# Patient Record
Sex: Female | Born: 1960 | Race: White | Hispanic: Yes | Marital: Married | Smoking: Former smoker
Health system: Southern US, Community
[De-identification: ages and names within clinical notes are randomized; demographics above are authoritative.]

## PROBLEM LIST (undated history)

## (undated) ENCOUNTER — Emergency Department: Discharge status: Absent without leave | Payer: Self-pay

## (undated) DIAGNOSIS — M1990 Unspecified osteoarthritis, unspecified site: Secondary | ICD-10-CM

## (undated) DIAGNOSIS — M19111 Post-traumatic osteoarthritis, right shoulder: Secondary | ICD-10-CM

## (undated) DIAGNOSIS — S46001S Unspecified injury of muscle(s) and tendon(s) of the rotator cuff of right shoulder, sequela: Secondary | ICD-10-CM

## (undated) DIAGNOSIS — R0600 Dyspnea, unspecified: Secondary | ICD-10-CM

## (undated) DIAGNOSIS — E079 Disorder of thyroid, unspecified: Secondary | ICD-10-CM

## (undated) DIAGNOSIS — G7000 Myasthenia gravis without (acute) exacerbation: Secondary | ICD-10-CM

## (undated) DIAGNOSIS — F419 Anxiety disorder, unspecified: Secondary | ICD-10-CM

## (undated) DIAGNOSIS — F319 Bipolar disorder, unspecified: Secondary | ICD-10-CM

## (undated) DIAGNOSIS — E039 Hypothyroidism, unspecified: Secondary | ICD-10-CM

## (undated) DIAGNOSIS — F609 Personality disorder, unspecified: Secondary | ICD-10-CM

## (undated) DIAGNOSIS — N189 Chronic kidney disease, unspecified: Secondary | ICD-10-CM

## (undated) DIAGNOSIS — M21371 Foot drop, right foot: Secondary | ICD-10-CM

## (undated) DIAGNOSIS — J449 Chronic obstructive pulmonary disease, unspecified: Secondary | ICD-10-CM

## (undated) DIAGNOSIS — K219 Gastro-esophageal reflux disease without esophagitis: Secondary | ICD-10-CM

## (undated) DIAGNOSIS — G4700 Insomnia, unspecified: Secondary | ICD-10-CM

## (undated) HISTORY — DX: Bipolar disorder, unspecified: F31.9

## (undated) HISTORY — PX: EYE SURGERY: SHX253

## (undated) HISTORY — PX: INCONTINENCE SURGERY: SHX676

## (undated) HISTORY — DX: Myasthenia gravis without (acute) exacerbation: G70.00

## (undated) HISTORY — PX: INDUCED ABORTION: SHX677

## (undated) HISTORY — PX: LAPAROTOMY: SHX154

## (undated) HISTORY — DX: Disorder of thyroid, unspecified: E07.9

## (undated) HISTORY — DX: Unspecified injury of muscle(s) and tendon(s) of the rotator cuff of right shoulder, sequela: S46.001S

## (undated) HISTORY — PX: CONDYLOMA EXCISION/FULGURATION: SHX1389

## (undated) HISTORY — DX: Unspecified osteoarthritis, unspecified site: M19.90

## (undated) HISTORY — DX: Personality disorder, unspecified: F60.9

## (undated) HISTORY — PX: LEEP: SHX91

## (undated) HISTORY — PX: CHOLECYSTECTOMY: SHX55

## (undated) HISTORY — DX: Post-traumatic osteoarthritis, right shoulder: M19.111

## (undated) HISTORY — PX: THYROIDECTOMY: SHX17

## (undated) HISTORY — PX: OTHER SURGICAL HISTORY: SHX169

## (undated) HISTORY — DX: Gastro-esophageal reflux disease without esophagitis: K21.9

## (undated) HISTORY — PX: CARDIAC SURGERY: SHX584

## (undated) HISTORY — DX: Chronic obstructive pulmonary disease, unspecified: J44.9

## (undated) HISTORY — DX: Insomnia, unspecified: G47.00

---

## 2009-04-14 ENCOUNTER — Encounter: Payer: Self-pay | Admitting: Family Medicine

## 2009-07-23 ENCOUNTER — Ambulatory Visit: Payer: Self-pay | Admitting: Family Medicine

## 2009-07-23 DIAGNOSIS — J449 Chronic obstructive pulmonary disease, unspecified: Secondary | ICD-10-CM | POA: Insufficient documentation

## 2009-07-23 DIAGNOSIS — M069 Rheumatoid arthritis, unspecified: Secondary | ICD-10-CM | POA: Insufficient documentation

## 2009-08-22 ENCOUNTER — Ambulatory Visit: Payer: Self-pay | Admitting: Family Medicine

## 2009-08-22 DIAGNOSIS — F603 Borderline personality disorder: Secondary | ICD-10-CM | POA: Insufficient documentation

## 2009-08-22 DIAGNOSIS — F4310 Post-traumatic stress disorder, unspecified: Secondary | ICD-10-CM | POA: Insufficient documentation

## 2009-08-22 DIAGNOSIS — M25569 Pain in unspecified knee: Secondary | ICD-10-CM | POA: Insufficient documentation

## 2009-08-22 DIAGNOSIS — F319 Bipolar disorder, unspecified: Secondary | ICD-10-CM | POA: Insufficient documentation

## 2009-08-22 DIAGNOSIS — G43909 Migraine, unspecified, not intractable, without status migrainosus: Secondary | ICD-10-CM | POA: Insufficient documentation

## 2009-08-27 ENCOUNTER — Telehealth: Payer: Self-pay | Admitting: Family Medicine

## 2009-09-03 ENCOUNTER — Telehealth: Payer: Self-pay | Admitting: Family Medicine

## 2009-10-02 ENCOUNTER — Encounter: Admission: RE | Admit: 2009-10-02 | Discharge: 2009-10-02 | Payer: Self-pay | Admitting: Sports Medicine

## 2009-10-02 ENCOUNTER — Encounter: Payer: Self-pay | Admitting: Family Medicine

## 2009-12-16 ENCOUNTER — Ambulatory Visit: Payer: Self-pay | Admitting: Obstetrics & Gynecology

## 2009-12-16 LAB — CONVERTED CEMR LAB: Chlamydia, DNA Probe: NEGATIVE

## 2009-12-22 ENCOUNTER — Encounter: Admission: RE | Admit: 2009-12-22 | Discharge: 2009-12-22 | Payer: Self-pay | Admitting: Obstetrics & Gynecology

## 2009-12-31 ENCOUNTER — Ambulatory Visit: Payer: Self-pay | Admitting: Obstetrics & Gynecology

## 2009-12-31 ENCOUNTER — Other Ambulatory Visit: Admission: RE | Admit: 2009-12-31 | Discharge: 2009-12-31 | Payer: Self-pay | Admitting: Obstetrics & Gynecology

## 2010-02-03 ENCOUNTER — Ambulatory Visit: Payer: Self-pay | Admitting: Family

## 2010-02-03 LAB — CONVERTED CEMR LAB
ALT: 13 units/L (ref 0–35)
AST: 19 units/L (ref 0–37)
Albumin: 4.4 g/dL (ref 3.5–5.2)
Alkaline Phosphatase: 66 units/L (ref 39–117)
Amylase: 72 units/L (ref 0–105)
Bilirubin Urine: NEGATIVE
Bilirubin, Direct: 0.1 mg/dL (ref 0.0–0.3)
Indirect Bilirubin: 0.2 mg/dL (ref 0.0–0.9)
Lipase: 24 units/L (ref 0–75)
Total Bilirubin: 0.3 mg/dL (ref 0.3–1.2)
Urobilinogen, UA: 0.2

## 2010-02-04 ENCOUNTER — Telehealth: Payer: Self-pay | Admitting: Family

## 2010-02-04 ENCOUNTER — Encounter: Admission: RE | Admit: 2010-02-04 | Discharge: 2010-02-04 | Payer: Self-pay | Admitting: Family Medicine

## 2010-02-06 ENCOUNTER — Encounter: Payer: Self-pay | Admitting: Family

## 2010-02-09 ENCOUNTER — Telehealth: Payer: Self-pay | Admitting: Family

## 2010-02-19 ENCOUNTER — Encounter: Admission: RE | Admit: 2010-02-19 | Discharge: 2010-02-19 | Payer: Self-pay | Admitting: Family Medicine

## 2010-02-19 ENCOUNTER — Ambulatory Visit: Payer: Self-pay | Admitting: Family

## 2010-02-20 ENCOUNTER — Telehealth: Payer: Self-pay | Admitting: Family

## 2010-05-12 ENCOUNTER — Encounter: Admission: RE | Admit: 2010-05-12 | Discharge: 2010-05-12 | Payer: Self-pay | Admitting: Family Medicine

## 2010-05-12 ENCOUNTER — Ambulatory Visit: Payer: Self-pay | Admitting: Family Medicine

## 2010-05-13 ENCOUNTER — Encounter: Payer: Self-pay | Admitting: Family Medicine

## 2010-05-13 LAB — CONVERTED CEMR LAB
HCT: 41.4 % (ref 36.0–46.0)
Lymphs Abs: 1.9 10*3/uL (ref 0.7–4.0)
MCHC: 33.9 g/dL (ref 30.0–36.0)
Monocytes Relative: 7 % (ref 3–12)
Neutro Abs: 5 10*3/uL (ref 1.7–7.7)
Neutrophils Relative %: 68 % (ref 43–77)
RBC: 4.36 M/uL (ref 3.87–5.11)

## 2010-06-03 ENCOUNTER — Encounter: Payer: Self-pay | Admitting: Family Medicine

## 2010-06-03 LAB — CONVERTED CEMR LAB: HDL: 15 mg/dL

## 2010-06-05 ENCOUNTER — Ambulatory Visit: Payer: Self-pay | Admitting: Family Medicine

## 2010-06-05 DIAGNOSIS — E785 Hyperlipidemia, unspecified: Secondary | ICD-10-CM | POA: Insufficient documentation

## 2010-06-08 LAB — CONVERTED CEMR LAB: Direct LDL: 128 mg/dL — ABNORMAL HIGH

## 2010-07-27 ENCOUNTER — Telehealth: Payer: Self-pay | Admitting: Family Medicine

## 2010-07-28 ENCOUNTER — Encounter: Payer: Self-pay | Admitting: Family Medicine

## 2010-07-29 LAB — CONVERTED CEMR LAB: VLDL: 35 mg/dL (ref 0–40)

## 2010-10-11 ENCOUNTER — Encounter: Payer: Self-pay | Admitting: Family Medicine

## 2010-10-20 NOTE — Progress Notes (Signed)
Summary: Lab order  Phone Note Call from Patient Call back at Home Phone [phone]   Caller: Patient Call For: Nani Gasser MD Summary of Call: Needs order for her choleterol to be checked. Will go in the morning. Fax to [facility] Initial call taken by: Kathlene November LPN,  July 27, 2010 1:38 PM

## 2010-10-20 NOTE — Letter (Signed)
Summary: Health Screening  Health Screening   Imported By: Lanelle Bal 07/29/2010 09:48:07  _____________________________________________________________________  External Attachment:    Type:   Image     Comment:   External Document

## 2010-10-20 NOTE — Assessment & Plan Note (Signed)
Summary: Atypical right chest pain   Vital Signs:  Patient profile:   50 year old female Height:      65 inches Weight:      203 pounds Pulse rate:   74 / minute BP sitting:   116 / 74  (left arm) Cuff size:   large  Vitals Entered By: Avon Gully CMA, Duncan Dull) (May 12, 2010 4:31 PM) CC: chest paint on the rt side since this am.Pt states it hurts to cough and breath deeply   CC:  chest paint on the rt side since this am.Pt states it hurts to cough and breath deeply.  History of Present Illness: chest paint on the rt side since this am.Pt states it hurts to cough and breath deeply  Yesterday mid-morning and started getting a pain in her right chest with each breath. Was not doing anything when it started.  Now pain is traveling into her right breast.  Still painful when she breaths in. If breaths out hard then hurts  feels like a pulled muscles.  Hx of prolonged of long QT on previous EKG.  No recent URI or fever sxs. Slight cough this AM.  Used to hot coffee to settle it. Never had this before.   Current Medications (verified): 1)  Geodon 80 Mg Caps (Ziprasidone Hcl) .... 3 Tab By Mouth Once Daily 2)  Ranitidine Hcl 300 Mg Tabs (Ranitidine Hcl) .Marland Kitchen.. 1 Tab By Mouth Once Daily 3)  Zegerid 40-1100 Mg Caps (Omeprazole-Sodium Bicarbonate) .Marland Kitchen.. 1 Capsule By Mouth Bid 4)  Levothroid 137 Mcg Tabs (Levothyroxine Sodium) .Marland Kitchen.. 1 Tab By Mouth Once Daily 5)  Senna Laxative 8.6 Mg Tabs (Sennosides) .... 2 Tabs By Mouth Once Daily 6)  Dulera 200-5 Mcg/act Aero (Mometasone Furo-Formoterol Fum) .... 2 Puffs By Mouth Two Times A Day 7)  Proair Hfa 108 (90 Base) Mcg/act  Aers (Albuterol Sulfate) .... 2 Inh Q4h As Needed Shortness of Breath To Be Used As A Rescue Inhaler 8)  Oxybutynin Chloride 5 Mg Tabs (Oxybutynin Chloride) .... Take One Tablet By Mouth in The Mornings and One Tablet At Bedtime 9)  Clonidine Hcl 0.1 Mg Tabs (Clonidine Hcl) .... Take 2 Tablets By Mouth At Bedtime 10)  Topamax 100  Mg Tabs (Topiramate) .... Take 2 Tablets By Mouth On The Mornings and One Tablet By Mouth At Bedtime 11)  Seroquel 400 Mg Tabs (Quetiapine Fumarate) .... Take One Tablet By Mouth At Bedtime 12)  Pristiq 100 Mg Xr24h-Tab (Desvenlafaxine Succinate) .... Take One Tablet By Mouth Aqt Bedtime 13)  Vicodin Hp 10-660 Mg Tabs (Hydrocodone-Acetaminophen) .... Take One Tablet By Mouth As Needed For Pain 14)  Butalbital-Apap-Caffeine 50-325-40 Mg Caps (Butalbital-Apap-Caffeine) .... Take One Tablet By Mouth As Needed For Pain 15)  Clindamycin Phosphate 1 % Soln (Clindamycin Phosphate) .... As Needed 16)  Stool Softener 100 Mg Caps (Docusate Sodium) .... Takje 2 Tablets By Mouth At Bedtime 17)  One-A-Day Extras Antioxidant  Caps (Multiple Vitamins-Minerals) .... Take One Tablet By Mouth Once A Day 18)  Fish Oil 1000 Mg Caps (Omega-3 Fatty Acids) .... Take One Tablet By Mouth in The Mornings 19)  Super Calcium/d 600-125 Mg-Unit Tabs (Calcium-Vitamin D) .... Take 2 Tablets in The Morning and 1 Tablet At Bedtime 20)  Lotrisone 1-0.05 % Crea (Clotrimazole-Betamethasone) .... Apply Twice Daily To Affected Areas Until Healed. 21)  Macrodantin 100 Mg Caps (Nitrofurantoin Macrocrystal) .... One Tablet By Mouth Two Times A Day X 7 Days 22)  Lidoderm 5 % Ptch (Lidocaine) .Marland KitchenMarland KitchenMarland Kitchen  Apply To Affected Area 12 Hours A Day  Allergies (verified): 1)  ! Sulfa 2)  ! Penicillin 3)  ! * Bee Stings  Comments:  Nurse/Medical Assistant: The patient's medications and allergies were reviewed with the patient and were updated in the Medication and Allergy Lists. Avon Gully CMA, Duncan Dull) (May 12, 2010 4:32 PM)  Past History:  Past Medical History: Last updated: 08/22/2009 COPD Thyroid Sleep insomia Acid Reflux Rheumatoid arthritis Bipolar - Treated by Dr. Sandria Manly (psych in [facility]) [phone] Personality disorder  Past Surgical History: Last updated: 08/22/2009 Caesarean section Cholecystectomy Thyroidectomy  - hemi LEEP Procedure - 2 Bladder sling procedure - 2 Eye surgery Abortion of pregnancy  Physical Exam  General:  Well-developed,well-nourished,in no acute distress; alert,appropriate and cooperative throughout examination Head:  Normocephalic and atraumatic without obvious abnormalities. No apparent alopecia or balding. Neck:  No deformities, masses, or tenderness noted. Lungs:  Normal respiratory effort, chest expands symmetrically. Lungs are clear to auscultation, no crackles or wheezes. Heart:  Normal rate and regular rhythm. S1 and S2 normal without gallop, murmur, click, rub or other extra sounds. Skin:  no rashes.   Psych:  Cognition and judgment appear intact. Alert and cooperative with normal attention span and concentration. No apparent delusions, illusions, hallucinations   Impression & Recommendations:  Problem # 1:  CHEST PAIN, ATYPICAL (ICD-786.59) Worrisome for possible pneuomothorax or PNA.  Will get CXR and stat CBC to better evaluate.  EKG shows rate of 81bpm, NSR with prolonged QT.   Orders: T-Chest x-ray, 2 views (71020) T-CBC w/Diff ([id number]) EKG w/ Interpretation (93000)  Complete Medication List: 1)  Geodon 80 Mg Caps (Ziprasidone hcl) .... 3 tab by mouth once daily 2)  Ranitidine Hcl 300 Mg Tabs (Ranitidine hcl) .Marland Kitchen.. 1 tab by mouth once daily 3)  Zegerid 40-1100 Mg Caps (Omeprazole-sodium bicarbonate) .Marland Kitchen.. 1 capsule by mouth bid 4)  Levothroid 137 Mcg Tabs (Levothyroxine sodium) .Marland Kitchen.. 1 tab by mouth once daily 5)  Senna Laxative 8.6 Mg Tabs (Sennosides) .... 2 tabs by mouth once daily 6)  Dulera 200-5 Mcg/act Aero (Mometasone furo-formoterol fum) .... 2 puffs by mouth two times a day 7)  Proair Hfa 108 (90 Base) Mcg/act Aers (Albuterol sulfate) .... 2 inh q4h as needed shortness of breath to be used as a rescue inhaler 8)  Oxybutynin Chloride 5 Mg Tabs (Oxybutynin chloride) .... Take one tablet by mouth in the mornings and one tablet at bedtime 9)   Clonidine Hcl 0.1 Mg Tabs (Clonidine hcl) .... Take 2 tablets by mouth at bedtime 10)  Topamax 100 Mg Tabs (Topiramate) .... Take 2 tablets by mouth on the mornings and one tablet by mouth at bedtime 11)  Seroquel 400 Mg Tabs (Quetiapine fumarate) .... Take one tablet by mouth at bedtime 12)  Pristiq 100 Mg Xr24h-tab (Desvenlafaxine succinate) .... Take one tablet by mouth aqt bedtime 13)  Vicodin Hp 10-660 Mg Tabs (Hydrocodone-acetaminophen) .... Take one tablet by mouth as needed for pain 14)  Butalbital-apap-caffeine 50-325-40 Mg Caps (Butalbital-apap-caffeine) .... Take one tablet by mouth as needed for pain 15)  Clindamycin Phosphate 1 % Soln (Clindamycin phosphate) .... As needed 16)  Stool Softener 100 Mg Caps (Docusate sodium) .... Takje 2 tablets by mouth at bedtime 17)  One-a-day Extras Antioxidant Caps (Multiple vitamins-minerals) .... Take one tablet by mouth once a day 18)  Fish Oil 1000 Mg Caps (Omega-3 fatty acids) .... Take one tablet by mouth in the mornings 19)  Super Calcium/d 600-125 Mg-unit Tabs (  Calcium-vitamin d) .... Take 2 tablets in the morning and 1 tablet at bedtime 20)  Lotrisone 1-0.05 % Crea (Clotrimazole-betamethasone) .... Apply twice daily to affected areas until healed. 21)  Macrodantin 100 Mg Caps (Nitrofurantoin macrocrystal) .... One tablet by mouth two times a day x 7 days 22)  Lidoderm 5 % Ptch (Lidocaine) .... Apply to affected area 12 hours a day

## 2010-10-20 NOTE — Assessment & Plan Note (Signed)
Summary: DISCUSS LABS   Vital Signs:  Patient profile:   50 year old female Height:      65 inches Weight:      206 pounds Pulse rate:   96 / minute BP sitting:   94 / 62  (left arm) Cuff size:   large  Vitals Entered By: Kathlene November (June 05, 2010 8:42 AM) CC: discuss labs she had done at husbands work. Cholesterol high, Lipid Management   Primary Care Quanta Roher:  Nani Gasser MD  CC:  discuss labs she had done at husbands work. Cholesterol high and Lipid Management.  History of Present Illness: discuss labs she had done at husbands work. Cholesterol high. Brought in a copy. She admits she doesn't eat well and doesn't exercise. Says that she can't cook healthy for her self and then cook something different for her family.  Dhe is taking one fish oil tab a daily.   Lipid Management History:      Positive NCEP/ATP III risk factors include HDL cholesterol less than 40 and current tobacco user.  Negative NCEP/ATP III risk factors include female age less than 36 years old, no history of early menopause without estrogen hormone replacement, non-diabetic, no family history for ischemic heart disease, non-hypertensive, no ASHD (atherosclerotic heart disease), no prior stroke/TIA, no peripheral vascular disease, and no history of aortic aneurysm.     Current Medications (verified): 1)  Geodon 80 Mg Caps (Ziprasidone Hcl) .... 3 Tab By Mouth Once Daily 2)  Ranitidine Hcl 300 Mg Tabs (Ranitidine Hcl) .Marland Kitchen.. 1 Tab By Mouth Once Daily 3)  Zegerid 40-1100 Mg Caps (Omeprazole-Sodium Bicarbonate) .Marland Kitchen.. 1 Capsule By Mouth Bid 4)  Levothroid 137 Mcg Tabs (Levothyroxine Sodium) .Marland Kitchen.. 1 Tab By Mouth Once Daily 5)  Senna Laxative 8.6 Mg Tabs (Sennosides) .... 2 Tabs By Mouth Once Daily 6)  Dulera 200-5 Mcg/act Aero (Mometasone Furo-Formoterol Fum) .... 2 Puffs By Mouth Two Times A Day 7)  Proair Hfa 108 (90 Base) Mcg/act  Aers (Albuterol Sulfate) .... 2 Inh Q4h As Needed Shortness of Breath To Be  Used As A Rescue Inhaler 8)  Clonidine Hcl 0.1 Mg Tabs (Clonidine Hcl) .... Take 2 Tablets By Mouth At Bedtime 9)  Topamax 100 Mg Tabs (Topiramate) .... Take 2 Tablets By Mouth On The Mornings and One Tablet By Mouth At Bedtime 10)  Seroquel 400 Mg Tabs (Quetiapine Fumarate) .... Take One Tablet By Mouth At Bedtime 11)  Pristiq 100 Mg Xr24h-Tab (Desvenlafaxine Succinate) .... Take One Tablet By Mouth Aqt Bedtime 12)  Vicodin Hp 10-660 Mg Tabs (Hydrocodone-Acetaminophen) .... Take One Tablet By Mouth As Needed For Pain 13)  Butalbital-Apap-Caffeine 50-325-40 Mg Caps (Butalbital-Apap-Caffeine) .... Take One Tablet By Mouth As Needed For Pain 14)  Clindamycin Phosphate 1 % Soln (Clindamycin Phosphate) .... As Needed 15)  Stool Softener 100 Mg Caps (Docusate Sodium) .... Takje 2 Tablets By Mouth At Bedtime 16)  One-A-Day Extras Antioxidant  Caps (Multiple Vitamins-Minerals) .... Take One Tablet By Mouth Once A Day 17)  Fish Oil 1000 Mg Caps (Omega-3 Fatty Acids) .... Take One Tablet By Mouth in The Mornings 18)  Super Calcium/d 600-125 Mg-Unit Tabs (Calcium-Vitamin D) .... Take 2 Tablets in The Morning and 1 Tablet At Bedtime 19)  Lotrisone 1-0.05 % Crea (Clotrimazole-Betamethasone) .... Apply Twice Daily To Affected Areas Until Healed. 20)  Lidoderm 5 % Ptch (Lidocaine) .... Apply To Affected Area 12 Hours A Day  Allergies (verified): 1)  ! Sulfa 2)  ! Penicillin 3)  ! *  Bee Stings  Comments:  Nurse/Medical Assistant: The patient's medications and allergies were reviewed with the patient and were updated in the Medication and Allergy Lists. Kathlene November (June 05, 2010 8:43 AM)  Physical Exam  General:  Well-developed,well-nourished,in no acute distress; alert,appropriate and cooperative throughout examination   Impression & Recommendations:  Problem # 1:  HYPERLIPIDEMIA (ICD-272.4) Had a long discussion about eating healthy and cooking healthy for her family. She feels this is not  possible. I also encouraged regularly exercise but I din't think she will do this. She is on seroquel as well whcih could be contributing Need to get a direct LDL.Will get that today.  She take 1 fish oil a day. Discussed changing to Lovaza since her TG are over 500. Will like hav eot prior auth.  Then recheck in 8 weeks.  20 min spent face to face in counseling.  Orders: T-LDL Direct ([id number])  Her updated medication list for this problem includes:    Lovaza 1 Gm Caps (Omega-3-acid ethyl esters) .Marland KitchenMarland KitchenMarland KitchenMarland Kitchen 4 capsules by mouth daily  Complete Medication List: 1)  Geodon 80 Mg Caps (Ziprasidone hcl) .... 3 tab by mouth once daily 2)  Ranitidine Hcl 300 Mg Tabs (Ranitidine hcl) .Marland Kitchen.. 1 tab by mouth once daily 3)  Zegerid 40-1100 Mg Caps (Omeprazole-sodium bicarbonate) .Marland Kitchen.. 1 capsule by mouth bid 4)  Levothroid 137 Mcg Tabs (Levothyroxine sodium) .Marland Kitchen.. 1 tab by mouth once daily 5)  Senna Laxative 8.6 Mg Tabs (Sennosides) .... 2 tabs by mouth once daily 6)  Dulera 200-5 Mcg/act Aero (Mometasone furo-formoterol fum) .... 2 puffs by mouth two times a day 7)  Proair Hfa 108 (90 Base) Mcg/act Aers (Albuterol sulfate) .... 2 inh q4h as needed shortness of breath to be used as a rescue inhaler 8)  Clonidine Hcl 0.1 Mg Tabs (Clonidine hcl) .... Take 2 tablets by mouth at bedtime 9)  Topamax 100 Mg Tabs (Topiramate) .... Take 2 tablets by mouth on the mornings and one tablet by mouth at bedtime 10)  Seroquel 400 Mg Tabs (Quetiapine fumarate) .... Take one tablet by mouth at bedtime 11)  Pristiq 100 Mg Xr24h-tab (Desvenlafaxine succinate) .... Take one tablet by mouth aqt bedtime 12)  Vicodin Hp 10-660 Mg Tabs (Hydrocodone-acetaminophen) .... Take one tablet by mouth as needed for pain 13)  Butalbital-apap-caffeine 50-325-40 Mg Caps (Butalbital-apap-caffeine) .... Take one tablet by mouth as needed for pain 14)  Clindamycin Phosphate 1 % Soln (Clindamycin phosphate) .... As needed 15)  Stool Softener 100 Mg  Caps (Docusate sodium) .... Takje 2 tablets by mouth at bedtime 16)  One-a-day Extras Antioxidant Caps (Multiple vitamins-minerals) .... Take one tablet by mouth once a day 17)  Fish Oil 1000 Mg Caps (Omega-3 fatty acids) .... Take one tablet by mouth in the mornings 18)  Super Calcium/d 600-125 Mg-unit Tabs (Calcium-vitamin d) .... Take 2 tablets in the morning and 1 tablet at bedtime 19)  Lotrisone 1-0.05 % Crea (Clotrimazole-betamethasone) .... Apply twice daily to affected areas until healed. 20)  Lidoderm 5 % Ptch (Lidocaine) .... Apply to affected area 12 hours a day 21)  Lovaza 1 Gm Caps (Omega-3-acid ethyl esters) .... 4 capsules by mouth daily  Lipid Assessment/Plan:      Based on NCEP/ATP III, the patient's risk factor category is "2 or more risk factors and a calculated 10 year CAD risk of > 20%".  The patient's lipid goals are as follows: Total cholesterol goal is 200; LDL cholesterol goal is 160; HDL cholesterol goal  is 40; Triglyceride goal is 150.    Patient Instructions: 1)  Follow up in 2-3 months and come in fasting for your labs.   Prescriptions: LOVAZA 1 GM CAPS (OMEGA-3-ACID ETHYL ESTERS) 4 capsules by mouth daily  #120 x 0   Entered and Authorized by:   Nani Gasser MD   Signed by:   Nani Gasser MD on 06/05/2010   Method used:   Electronically to        [facility]  [address] (retail)       [address]       [place], [place]  [address]       Ph: [phone]       Fax: [phone]   RxID:   [id number]    Lipid Panel Test Date: 06/03/2010                        Value        Units        H/L   Reference  Cholesterol:          234          mg/dL              (841-324) HDL Cholesterol:      15           mg/dL         L    (40-10) Triglyceride:         560          mg/dL         H    (27-253)

## 2010-10-20 NOTE — Assessment & Plan Note (Signed)
Summary: FU//VGJ   Vital Signs:  Patient profile:   50 year old female Height:      65 inches Weight:      197 pounds BMI:     32.90 O2 Sat:      99 % on Room air Pulse rate:   97 / minute BP sitting:   112 / 70  (left arm) Cuff size:   regular  O2 Flow:  Room air CC: F/U abd pain. Pain is worse. Has not started macrodantin.    CC:  F/U abd pain. Pain is worse. Has not started macrodantin. Marland Kitchen  History of Present Illness: Mandy Shaw is a 50 year old female who presents today for follow up.    1)Flank pain- Notes that she is now having similar pain on left side.  Pain is intermittent in nature.  It is some times on both sides, some times one side only.  When she experieces pain both sides, the pain intensity is never equal.  She has tried ibuprofen without relief.  Occasionally using hydrocodone/fioricet which does not help pain, but does help her to go to sleep.  Patient is s/p cholescystecomy and had a CT of the abdomen which was negative.  2)E Coli UTI- has not started macrodantin due to financial problems.  Tells me that she gets paid today and plans to start.   Denies dysuria or fever.    Current Medications (verified): 1)  Geodon 80 Mg Caps (Ziprasidone Hcl) .... 3 Tab By Mouth Once Daily 2)  Ranitidine Hcl 300 Mg Tabs (Ranitidine Hcl) .Marland Kitchen.. 1 Tab By Mouth Once Daily 3)  Zegerid 40-1100 Mg Caps (Omeprazole-Sodium Bicarbonate) .... 2 Tabs By Mouth Once Daily 4)  Levothroid 137 Mcg Tabs (Levothyroxine Sodium) .Marland Kitchen.. 1 Tab By Mouth Once Daily 5)  Senna Laxative 8.6 Mg Tabs (Sennosides) .... 2 Tabs By Mouth Once Daily 6)  Dulera 200-5 Mcg/act Aero (Mometasone Furo-Formoterol Fum) .... 2 Puffs By Mouth Two Times A Day 7)  Proair Hfa 108 (90 Base) Mcg/act  Aers (Albuterol Sulfate) .... 2 Inh Q4h As Needed Shortness of Breath To Be Used As A Rescue Inhaler 8)  Lunesta 3 Mg Tabs (Eszopiclone) .... Take One Tablet By Mouth At Bedtime 9)  Oxybutynin Chloride 5 Mg Tabs (Oxybutynin Chloride)  .... Take One Tablet By Mouth in The Mornings and One Tablet At Bedtime 10)  Clonidine Hcl 0.1 Mg Tabs (Clonidine Hcl) .... Take 2 Tablets By Mouth At Bedtime 11)  Topamax 100 Mg Tabs (Topiramate) .... Take 2 Tablets By Mouth On The Mornings and One Tablet By Mouth At Bedtime 12)  Seroquel 400 Mg Tabs (Quetiapine Fumarate) .... Take One Tablet By Mouth At Bedtime 13)  Pristiq 100 Mg Xr24h-Tab (Desvenlafaxine Succinate) .... Take One Tablet By Mouth Aqt Bedtime 14)  Vicodin Hp 10-660 Mg Tabs (Hydrocodone-Acetaminophen) .... Take One Tablet By Mouth As Needed For Pain 15)  Butalbital-Apap-Caffeine 50-325-40 Mg Caps (Butalbital-Apap-Caffeine) .... Take One Tablet By Mouth As Needed For Pain 16)  Clindamycin Phosphate 1 % Soln (Clindamycin Phosphate) .... As Needed 17)  Stool Softener 100 Mg Caps (Docusate Sodium) .... Takje 2 Tablets By Mouth At Bedtime 18)  One-A-Day Extras Antioxidant  Caps (Multiple Vitamins-Minerals) .... Take One Tablet By Mouth Once A Day 19)  Fish Oil 1000 Mg Caps (Omega-3 Fatty Acids) .... Take One Tablet By Mouth in The Mornings 20)  Super Calcium/d 600-125 Mg-Unit Tabs (Calcium-Vitamin D) .... Take 2 Tablets in The Morning and 1 Tablet At  Bedtime 21)  Lotrisone 1-0.05 % Crea (Clotrimazole-Betamethasone) .... Apply Twice Daily To Affected Areas Until Healed. 22)  Macrodantin 100 Mg Caps (Nitrofurantoin Macrocrystal) .... One Tablet By Mouth Two Times A Day X 7 Days  Allergies (verified): 1)  ! Sulfa 2)  ! Penicillin 3)  ! * Bee Stings  Physical Exam  General:  Well-developed,well-nourished,in no acute distress;  Head:  Normocephalic and atraumatic without obvious abnormalities. No apparent alopecia or balding. Lungs:  Normal respiratory effort, chest expands symmetrically. Lungs are clear to auscultation, no crackles or wheezes. Heart:  Normal rate and regular rhythm. S1 and S2 normal without gallop, murmur, click, rub or other extra sounds. Abdomen:  + mild  reproducible tenderness noted beneath rib cage on left.    Negative CVAT   Impression & Recommendations:  Problem # 1:  FLANK PAIN (ICD-789.09) Assessment Unchanged Patient now with bilateral pain beneath rib cage.  Suspect musculoskeletal.  Notes + low back pain.   Will check plain film to evaluate spine, it is possible that this pain may be related to nerve compression.  Trial of Lidoderm patch. Her updated medication list for this problem includes:    Vicodin Hp 10-660 Mg Tabs (Hydrocodone-acetaminophen) .Marland Kitchen... Take one tablet by mouth as needed for pain    Butalbital-apap-caffeine 50-325-40 Mg Caps (Butalbital-apap-caffeine) .Marland Kitchen... Take one tablet by mouth as needed for pain  Orders: Lumbar Spine 2 Views ([id number]) Thoracic Spine 2 views ([id number])  Problem # 2:  UTI (ICD-599.0) Assessment: Unchanged Culture grew macrodantin sensitive E Coli.  Pt instructed to start macrodantin.  Her updated medication list for this problem includes:    Oxybutynin Chloride 5 Mg Tabs (Oxybutynin chloride) .Marland Kitchen... Take one tablet by mouth in the mornings and one tablet at bedtime    Macrodantin 100 Mg Caps (Nitrofurantoin macrocrystal) ..... One tablet by mouth two times a day x 7 days  Complete Medication List: 1)  Geodon 80 Mg Caps (Ziprasidone hcl) .... 3 tab by mouth once daily 2)  Ranitidine Hcl 300 Mg Tabs (Ranitidine hcl) .Marland Kitchen.. 1 tab by mouth once daily 3)  Zegerid 40-1100 Mg Caps (Omeprazole-sodium bicarbonate) .... 2 tabs by mouth once daily 4)  Levothroid 137 Mcg Tabs (Levothyroxine sodium) .Marland Kitchen.. 1 tab by mouth once daily 5)  Senna Laxative 8.6 Mg Tabs (Sennosides) .... 2 tabs by mouth once daily 6)  Dulera 200-5 Mcg/act Aero (Mometasone furo-formoterol fum) .... 2 puffs by mouth two times a day 7)  Proair Hfa 108 (90 Base) Mcg/act Aers (Albuterol sulfate) .... 2 inh q4h as needed shortness of breath to be used as a rescue inhaler 8)  Lunesta 3 Mg Tabs (Eszopiclone) .... Take one tablet by mouth at  bedtime 9)  Oxybutynin Chloride 5 Mg Tabs (Oxybutynin chloride) .... Take one tablet by mouth in the mornings and one tablet at bedtime 10)  Clonidine Hcl 0.1 Mg Tabs (Clonidine hcl) .... Take 2 tablets by mouth at bedtime 11)  Topamax 100 Mg Tabs (Topiramate) .... Take 2 tablets by mouth on the mornings and one tablet by mouth at bedtime 12)  Seroquel 400 Mg Tabs (Quetiapine fumarate) .... Take one tablet by mouth at bedtime 13)  Pristiq 100 Mg Xr24h-tab (Desvenlafaxine succinate) .... Take one tablet by mouth aqt bedtime 14)  Vicodin Hp 10-660 Mg Tabs (Hydrocodone-acetaminophen) .... Take one tablet by mouth as needed for pain 15)  Butalbital-apap-caffeine 50-325-40 Mg Caps (Butalbital-apap-caffeine) .... Take one tablet by mouth as needed for pain 16)  Clindamycin Phosphate 1 %  Soln (Clindamycin phosphate) .... As needed 17)  Stool Softener 100 Mg Caps (Docusate sodium) .... Takje 2 tablets by mouth at bedtime 18)  One-a-day Extras Antioxidant Caps (Multiple vitamins-minerals) .... Take one tablet by mouth once a day 19)  Fish Oil 1000 Mg Caps (Omega-3 fatty acids) .... Take one tablet by mouth in the mornings 20)  Super Calcium/d 600-125 Mg-unit Tabs (Calcium-vitamin d) .... Take 2 tablets in the morning and 1 tablet at bedtime 21)  Lotrisone 1-0.05 % Crea (Clotrimazole-betamethasone) .... Apply twice daily to affected areas until healed. 22)  Macrodantin 100 Mg Caps (Nitrofurantoin macrocrystal) .... One tablet by mouth two times a day x 7 days 23)  Lidoderm 5 % Ptch (Lidocaine) .... Apply to affected area 12 hours a day  Patient Instructions: 1)  Please complete your x-ray. 2)  Start the Macrodantin. 3)  You may use the lidoderm patches as needed. 4)  Call if your symptoms worsen or do not improve. 5)  Do not use butalbital or oxycodone with the lidoderm patch. Prescriptions: LIDODERM 5 % PTCH (LIDOCAINE) apply to affected area 12 hours a day  #15 x 1   Entered and Authorized by:    Lemont Fillers FNP   Signed by:   Lemont Fillers FNP on 02/19/2010   Method used:   Electronically to        [facility]* (retail)       [address]       [address] Shipping/mailing       [place], [place]  [address]       Ph: [phone]       Fax: [phone]   RxID:   [id number]

## 2010-10-20 NOTE — Progress Notes (Signed)
Summary: ct results--lm  Phone Note Outgoing Call   Summary of Call: Please call patient and let her know that her CT abdomen is normal.  Lab work is also normal.  She should follow up on 5/31 as scheduled. Initial call taken by: Lemont Fillers FNP,  Feb 04, 2010 3:22 PM  Follow-up for Phone Call        Left message on machine to return my call.  Mervin Kung CMA  Feb 04, 2010 3:33 PM   Additional Follow-up for Phone Call Additional follow up Details #1::        Pt would like you to return her call at [phone] Additional Follow-up by: Payton Spark CMA,  Feb 04, 2010 4:15 PM    Additional Follow-up for Phone Call Additional follow up Details #2::    Called patient.  Reviewed results pain is unchanged.  Recommended that she take abx as prescribed and follow up as scheduled, sooner if symptoms worsen. Follow-up by: Lemont Fillers FNP,  Feb 04, 2010 4:48 PM

## 2010-10-20 NOTE — Assessment & Plan Note (Signed)
Summary: Intermittant Pain in Rt. side rib area- jr   Vital Signs:  Patient profile:   50 year old female Height:      65 inches Weight:      198.25 pounds BMI:     33.11 Temp:     97.1 degrees F oral Pulse rate:   71 / minute Pulse rhythm:   regular Resp:     18 per minute BP sitting:   112 / 76  (right arm) Cuff size:   regular  Vitals Entered By: Mervin Kung CMA (Feb 03, 2010 2:37 PM) CC: room 5  Pt states she has had intermittent rt side pain x months but is becoming more frequent. Also has red, itchy patch on rt. ankle that has turned dark. Is Patient Diabetic? No Pain Assessment Patient in pain? yes     Location: rt side Intensity: 5 Type: sharp Comments Pt needs refill on dulera.   CC:  room 5  Pt states she has had intermittent rt side pain x months but is becoming more frequent. Also has red and itchy patch on rt. ankle that has turned dark..  History of Present Illness: Ms Castner is a 50 year old female who presents with several concerns today.  1) Asthma- reports that this is well controlled and is requesting a refill on her Dulera  2)Skin- c/o hyperpigmented itching patches on right leg and left wrist.  3) Flank pain- c/o pain in her right side- starts under her ribs on the right.  Starts out dull, then becomes sharp at times.  Symptoms started after her cholecystectomy.  She is unable to tell me when her Cholecystectomy was performed except to note that is was "years ago" not "months ago."  Now happening on a daily basis. She cannot find any associated alleviating factors or aggravating.  Pain is not relieved by Tylenol, motrin or hydrocodone or Fioricet.  The only time that she has noted relief in this pain was when she combied hydrocodone and Fioricet.  She denies history of herpes Zoster. Reports that pain is interfering with her quality of life.  History is limited as patient is a poor historian.  Allergies: 1)  ! Sulfa 2)  ! Penicillin 3)  ! * Bee  Stings  Physical Exam  General:  Well-developed,well-nourished,in no acute distress; alert,appropriate and cooperative throughout examination Lungs:  Normal respiratory effort, chest expands symmetrically. Lungs are clear to auscultation, no crackles or wheezes. Heart:  Normal rate and regular rhythm. S1 and S2 normal without gallop, murmur, click, rub or other extra sounds. Abdomen:  Abdomen is soft.  + bowel sounds.  No guarding or rebound tenderness is noted in the RUQ.  Pain lessens with deep palpation of this area.  No visible blisters or skin lesions on abdominal wall.   Msk:  Mild CVAT on right  Skin:  2 Anular hyperpigmented raised lesions noted on right lateral ankle.  Also similar lesion on left wrist.    Impression & Recommendations:  Problem # 1:  FLANK PAIN, RIGHT (ICD-789.09) Assessment Deteriorated Amylase, Lipase and LFT's are WNL.  She was noted to have microscopic hematuria on dip.  Nephrolithiasis is a possibility.  Will plan to treat with cipro for UTI and check CT with kidney stone protocol.  Clinically doubt acute pyelo given long standing nature of patient's pain and lack of fever.   If this is negative, will consider abdominal ultrasound +/- GI evaluation.   Her updated medication list for this problem  includes:    Vicodin Hp 10-660 Mg Tabs (Hydrocodone-acetaminophen) .Marland Kitchen... Take one tablet by mouth as needed for pain    Butalbital-apap-caffeine 50-325-40 Mg Caps (Butalbital-apap-caffeine) .Marland Kitchen... Take one tablet by mouth as needed for pain  Orders: T-Liver Profile [id number]) T-Lipase [id number]) T-Amylase [phone]) UA Dipstick w/o Micro (automated)  (81003) CT without Contrast (CT w/o contrast)  Problem # 2:  RINGWORM (ICD-110.9) Assessment: New Trial of lotrisone  Problem # 3:  UTI (ICD-599.0) Assessment: New Moderate leuks and trace blood on dip, send for culture, start cipro. Her updated medication list for this problem includes:    Oxybutynin  Chloride 5 Mg Tabs (Oxybutynin chloride) .Marland Kitchen... Take one tablet by mouth in the mornings and one tablet at bedtime    Cipro 500 Mg Tabs (Ciprofloxacin hcl) ..... One tablet by mouth two times a day x 7 days  Complete Medication List: 1)  Geodon 80 Mg Caps (Ziprasidone hcl) .... 3 tab by mouth once daily 2)  Ranitidine Hcl 300 Mg Tabs (Ranitidine hcl) .Marland Kitchen.. 1 tab by mouth once daily 3)  Zegerid 40-1100 Mg Caps (Omeprazole-sodium bicarbonate) .... 2 tabs by mouth once daily 4)  Levothroid 137 Mcg Tabs (Levothyroxine sodium) .Marland Kitchen.. 1 tab by mouth once daily 5)  Senna Laxative 8.6 Mg Tabs (Sennosides) .... 2 tabs by mouth once daily 6)  Tussionex Pennkinetic Er 8-10 Mg/70ml Lqcr (Chlorpheniramine-hydrocodone) .Marland Kitchen.. 1 ttsp by mouth 2x a day if covered 7)  Dulera 200-5 Mcg/act Aero (Mometasone furo-formoterol fum) .... 2 puffs by mouth two times a day 8)  Proair Hfa 108 (90 Base) Mcg/act Aers (Albuterol sulfate) .... 2 inh q4h as needed shortness of breath to be used as a rescue inhaler 9)  Lunesta 3 Mg Tabs (Eszopiclone) .... Take one tablet by mouth at bedtime 10)  Oxybutynin Chloride 5 Mg Tabs (Oxybutynin chloride) .... Take one tablet by mouth in the mornings and one tablet at bedtime 11)  Clonidine Hcl 0.1 Mg Tabs (Clonidine hcl) .... Take 2 tablets by mouth at bedtime 12)  Topamax 100 Mg Tabs (Topiramate) .... Take 2 tablets by mouth on the mornings and one tablet by mouth at bedtime 13)  Seroquel 400 Mg Tabs (Quetiapine fumarate) .... Take one tablet by mouth at bedtime 14)  Pristiq 100 Mg Xr24h-tab (Desvenlafaxine succinate) .... Take one tablet by mouth aqt bedtime 15)  Vicodin Hp 10-660 Mg Tabs (Hydrocodone-acetaminophen) .... Take one tablet by mouth as needed for pain 16)  Butalbital-apap-caffeine 50-325-40 Mg Caps (Butalbital-apap-caffeine) .... Take one tablet by mouth as needed for pain 17)  Clindamycin Phosphate 1 % Soln (Clindamycin phosphate) .... As needed 18)  Stool Softener 100 Mg Caps  (Docusate sodium) .... Takje 2 tablets by mouth at bedtime 19)  One-a-day Extras Antioxidant Caps (Multiple vitamins-minerals) .... Take one tablet by mouth once a day 20)  Fish Oil 1000 Mg Caps (Omega-3 fatty acids) .... Take one tablet by mouth in the mornings 21)  Super Calcium/d 600-125 Mg-unit Tabs (Calcium-vitamin d) .... Take 2 tablets in the morning and 1 tablet at bedtime 22)  Prednisolone Acetate 1 % Susp (Prednisolone acetate) .Marland Kitchen.. 1 drop daily 23)  Alphagan P 0.1 % Soln (Brimonidine tartrate) .Marland Kitchen.. 1 drop twice daily 24)  Cvs Purelax Powd (Polyethylene glycol 3350) .... As directed every other day 25)  Lotrisone 1-0.05 % Crea (Clotrimazole-betamethasone) .... Apply twice daily to affected areas until healed. 26)  Cipro 500 Mg Tabs (Ciprofloxacin hcl) .... One tablet by mouth two times a day x 7 days  Other Orders: Specimen Handling ([address]) T-Urine Culture (Spectrum Order) [id number])  Patient Instructions: 1)  You will be contacted about scheduling your CT scan. 2)  Please follow up in 2 weeks. Prescriptions: DULERA 200-5 MCG/ACT AERO (MOMETASONE FURO-FORMOTEROL FUM) 2 puffs by mouth two times a day  #1 x 2   Entered and Authorized by:   Lemont Fillers FNP   Signed by:   Lemont Fillers FNP on 02/04/2010   Method used:   Electronically to        [facility] (retail)       [address]       [place], [place]  [address]       Ph: [phone]       Fax: [phone]   RxID:   [id number] CIPRO 500 MG TABS (CIPROFLOXACIN HCL) one tablet by mouth two times a day x 7 days  #14 x 0   Entered and Authorized by:   Lemont Fillers FNP   Signed by:   Lemont Fillers FNP on 02/03/2010   Method used:   Electronically to        [facility] (retail)       [address]       [place], [place]  [address]       Ph: [phone]       Fax: [phone]   RxID:   [id number] LOTRISONE 1-0.05 % CREA (CLOTRIMAZOLE-BETAMETHASONE) apply  twice daily to affected areas until healed.  #1 x 0   Entered and Authorized by:   Lemont Fillers FNP   Signed by:   Lemont Fillers FNP on 02/03/2010   Method used:   Electronically to        [facility] (retail)       [address]       [place], [place]  [address]       Ph: [phone]       Fax: [phone]   RxID:   [id number]   Current Allergies (reviewed today): ! SULFA ! PENICILLIN ! * BEE STINGS  Laboratory Results   Urine Tests    Routine Urinalysis   Color: yellow Appearance: Cloudy Glucose: negative   (Normal Range: Negative) Bilirubin: negative   (Normal Range: Negative) Ketone: negative   (Normal Range: Negative) Spec. Gravity: <1.005   (Normal Range: 1.003-1.035) Blood: trace-intact   (Normal Range: Negative) pH: 6.5   (Normal Range: 5.0-8.0) Protein: negative   (Normal Range: Negative) Urobilinogen: 0.2   (Normal Range: 0-1) Nitrite: negative   (Normal Range: Negative) Leukocyte Esterace: moderate   (Normal Range: Negative)

## 2010-10-20 NOTE — Progress Notes (Signed)
Summary: pain patch  Phone Note From Pharmacy Call back at Home Phone [phone]   Caller: Patient Caller: [facility] in [place] Call For: Efraim Kaufmann  Summary of Call: pharmacy called and said pt said you were gonna send over a Lidoderm patch for her but it wasn't there. Seen in office visit where you said can use trial but no directions. Informed pt to use pain med given and you would get message on Monday and I would contact pharmacy then Initial call taken by: Kathlene November,  February 20, 2010 3:55 PM  Follow-up for Phone Call        called pharmacist- she will notify pt that rx is ready. Follow-up by: Lemont Fillers FNP,  February 20, 2010 6:23 PM    Prescriptions: LIDODERM 5 % PTCH (LIDOCAINE) apply to affected area 12 hours a day  #15 x 1   Entered and Authorized by:   Lemont Fillers FNP   Signed by:   Lemont Fillers FNP on 02/20/2010   Method used:   Electronically to        [facility] (retail)       [address]       [place], [place]  [address]       Ph: [phone]       Fax: [phone]   RxID:   [id number]

## 2010-10-20 NOTE — Progress Notes (Signed)
Summary: abx change  Phone Note Outgoing Call   Call placed by: Lemont Fillers FNP,  Feb 09, 2010 2:09 PM Summary of Call: Please call patient and let her know that based on her culture results, we need to swich her antibiotics.  She can stop cipro and start macrodantin  as ordered please. Initial call taken by: Lemont Fillers FNP,  Feb 09, 2010 2:10 PM  Follow-up for Phone Call        Advised pt. of need to change abx. Pt voices understanding.  Mervin Kung CMA  Feb 09, 2010 2:15 PM     New/Updated Medications: MACRODANTIN 100 MG CAPS (NITROFURANTOIN MACROCRYSTAL) one tablet by mouth two times a day x 7 days Prescriptions: MACRODANTIN 100 MG CAPS (NITROFURANTOIN MACROCRYSTAL) one tablet by mouth two times a day x 7 days  #14 x 0   Entered and Authorized by:   Lemont Fillers FNP   Signed by:   Lemont Fillers FNP on 02/09/2010   Method used:   Electronically to        [facility] (retail)       [address]       [place], [place]  [address]       Ph: [phone]       Fax: [phone]   RxID:   [id number]

## 2010-10-23 NOTE — Consult Note (Signed)
Summary: [facility]  [facility]   Imported By: Lanelle Bal 10/09/2009 10:58:51  _____________________________________________________________________  External Attachment:    Type:   Image     Comment:   External Document

## 2010-11-19 ENCOUNTER — Ambulatory Visit (HOSPITAL_BASED_OUTPATIENT_CLINIC_OR_DEPARTMENT_OTHER): Admission: RE | Admit: 2010-11-19 | Payer: BC Managed Care – PPO | Source: Home / Self Care | Admitting: Urology

## 2010-11-26 ENCOUNTER — Ambulatory Visit (HOSPITAL_BASED_OUTPATIENT_CLINIC_OR_DEPARTMENT_OTHER): Admission: RE | Admit: 2010-11-26 | Payer: BC Managed Care – PPO | Source: Home / Self Care | Admitting: Urology

## 2010-11-30 ENCOUNTER — Ambulatory Visit (INDEPENDENT_AMBULATORY_CARE_PROVIDER_SITE_OTHER): Payer: BC Managed Care – PPO | Admitting: Family Medicine

## 2010-11-30 ENCOUNTER — Encounter: Payer: Self-pay | Admitting: Family Medicine

## 2010-11-30 DIAGNOSIS — R350 Frequency of micturition: Secondary | ICD-10-CM

## 2010-11-30 DIAGNOSIS — L299 Pruritus, unspecified: Secondary | ICD-10-CM

## 2010-11-30 LAB — CONVERTED CEMR LAB
Ketones, urine, test strip: NEGATIVE
Protein, U semiquant: NEGATIVE
Specific Gravity, Urine: <1.005
Urobilinogen, UA: 0.2

## 2010-12-01 ENCOUNTER — Encounter: Payer: Self-pay | Admitting: Family Medicine

## 2010-12-01 DIAGNOSIS — R748 Abnormal levels of other serum enzymes: Secondary | ICD-10-CM | POA: Insufficient documentation

## 2010-12-01 LAB — CONVERTED CEMR LAB
ALT: 13 units/L (ref 0–35)
Basophils Absolute: 0.1 10*3/uL (ref 0.0–0.1)
Basophils Relative: 1 % (ref 0–1)
CO2: 20 meq/L (ref 19–32)
Creatinine, Ser: 1.85 mg/dL — ABNORMAL HIGH (ref 0.40–1.20)
Lymphocytes Relative: 35 % (ref 12–46)
Monocytes Absolute: 0.7 10*3/uL (ref 0.1–1.0)
Monocytes Relative: 7 % (ref 3–12)
Neutro Abs: 5.4 10*3/uL (ref 1.7–7.7)
RDW: 14.7 % (ref 11.5–15.5)
Total Bilirubin: 0.3 mg/dL (ref 0.3–1.2)
WBC: 9.7 10*3/uL (ref 4.0–10.5)

## 2010-12-02 ENCOUNTER — Encounter: Payer: Self-pay | Admitting: Family Medicine

## 2010-12-07 ENCOUNTER — Other Ambulatory Visit: Payer: Self-pay | Admitting: Family Medicine

## 2010-12-08 ENCOUNTER — Encounter: Payer: Self-pay | Admitting: Family Medicine

## 2010-12-08 LAB — CONVERTED CEMR LAB
BUN: 18 mg/dL (ref 6–23)
Bilirubin Urine: NEGATIVE
Blood, UA: NEGATIVE
Calcium: 9.4 mg/dL (ref 8.4–10.5)
Casts: NONE SEEN /lpf
Creatinine, Urine: 57.5 mg/dL
Ketones, ur: NEGATIVE mg/dL
Microalb, Ur: 2.12 mg/dL — ABNORMAL HIGH (ref 0.00–1.89)
Nitrite: NEGATIVE
Potassium: 4 meq/L (ref 3.5–5.3)
Specific Gravity, Urine: 1.007 (ref 1.005–1.030)
Urine Glucose: NEGATIVE mg/dL
pH: 6.5 (ref 5.0–8.0)

## 2010-12-08 NOTE — Assessment & Plan Note (Signed)
Summary: Itching, Urinary freq   Vital Signs:  Patient profile:   50 year old female Height:      65 inches Weight:      197 pounds O2 Sat:      96 % on Room air Temp:     98.0 degrees F oral Pulse rate:   96 / minute BP sitting:   127 / 84  (right arm) Cuff size:   large  Vitals Entered By: Avon Gully CMA, Duncan Dull) (November 30, 2010 2:15 PM)  O2 Flow:  Room air CC: itching all over since friday, no visable rash   Primary Care Provider:  Nani Gasser MD  CC:  itching all over since friday and no visable rash.  History of Present Illness: itching all over since friday (4 days ago) , no visable rash. Itching head to toe. Itching is intermittant.  NO rash or redness. Throat feels tight and ithcy and her tongue feels thick. soles and palms itch.  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  She is off her seroquel as well. Dr. Sandria Manly took her off Wellbutrin and Seroquel. She had wanted to stop her seroquel because of weight gain. Ran out of her simvastatin.  Restarted geodon on Tuesday. Having some epigastric pain and diarrhea started yesterday.  Eating doesn't improve her sxs. Runny nose with bloody mucous. No new fabiric softener, detergents, soaps, perfumes, etc.  Has lost 9 lbs since Nov whcih she is happy about. Also has some urinary frequency for severeal weeks. No hematuria or dysuria.    Current Medications (verified): 1)  Geodon 60 Mg Caps (Ziprasidone Hcl) .... 3 Tab By Mouth Once Daily 2)  Ranitidine Hcl 300 Mg Tabs (Ranitidine Hcl) .Marland Kitchen.. 1 Tab By Mouth Once Daily 3)  Zegerid 40-1100 Mg Caps (Omeprazole-Sodium Bicarbonate) .Marland Kitchen.. 1 Capsule By Mouth Bid 4)  Levothroid 137 Mcg Tabs (Levothyroxine Sodium) .Marland Kitchen.. 1 Tab By Mouth Once Daily 5)  Senna Laxative 8.6 Mg Tabs (Sennosides) .... 2 Tabs By Mouth Once Daily 6)  Dulera 200-5 Mcg/act Aero (Mometasone Furo-Formoterol Fum) .... 2 Puffs By Mouth Two Times A Day 7)  Proair Hfa 108 (90 Base) Mcg/act  Aers  (Albuterol Sulfate) .... 2 Inh Q4h As Needed Shortness of Breath To Be Used As A Rescue Inhaler 8)  Clonidine Hcl 0.1 Mg Tabs (Clonidine Hcl) .... Take 2 Tablets By Mouth At Bedtime 9)  Topamax 100 Mg Tabs (Topiramate) .... Take 2 Tablets By Mouth On The Mornings and One Tablet By Mouth At Bedtime 10)  Seroquel 400 Mg Tabs (Quetiapine Fumarate) .... Take One Tablet By Mouth At Bedtime 11)  Pristiq 100 Mg Xr24h-Tab (Desvenlafaxine Succinate) .... Take One Tablet By Mouth Aqt Bedtime 12)  Vicodin Hp 10-660 Mg Tabs (Hydrocodone-Acetaminophen) .... Take One Tablet By Mouth As Needed For Pain 13)  Butalbital-Apap-Caffeine 50-325-40 Mg Caps (Butalbital-Apap-Caffeine) .... Take One Tablet By Mouth As Needed For Pain 14)  Clindamycin Phosphate 1 % Soln (Clindamycin Phosphate) .... As Needed 15)  Stool Softener 100 Mg Caps (Docusate Sodium) .... Takje 2 Tablets By Mouth At Bedtime 16)  One-A-Day Extras Antioxidant  Caps (Multiple Vitamins-Minerals) .... Take One Tablet By Mouth Once A Day 17)  Fish Oil 1000 Mg Caps (Omega-3 Fatty Acids) .... Take One Tablet By Mouth in The Mornings 18)  Super Calcium/d 600-125 Mg-Unit Tabs (Calcium-Vitamin D) .... Take 2 Tablets in The Morning and 1 Tablet At Bedtime 19)  Lotrisone 1-0.05 % Crea (Clotrimazole-Betamethasone) .... Apply Twice  Daily To Affected Areas Until Healed. 20)  Lidoderm 5 % Ptch (Lidocaine) .... Apply To Affected Area 12 Hours A Day 21)  Lovaza 1 Gm Caps (Omega-3-Acid Ethyl Esters) .... 4 Capsules By Mouth Daily 22)  Simvastatin 20 Mg Tabs (Simvastatin) .... Take 1 Tablet By Mouth Once A Day At Bedtime  Allergies (verified): 1)  ! Sulfa 2)  ! Penicillin 3)  ! * Bee Stings  Comments:  Nurse/Medical Assistant: The patient's medications and allergies were reviewed with the patient and were updated in the Medication and Allergy Lists. Avon Gully CMA, Duncan Dull) (November 30, 2010 2:16 PM)  Physical Exam  General:   Well-developed,well-nourished,in no acute distress; alert,appropriate and cooperative throughout examination Head:  Normocephalic and atraumatic without obvious abnormalities. No apparent alopecia or balding. Neck:  No deformities, masses, or tenderness noted. Lungs:  Normal respiratory effort, chest expands symmetrically. Lungs are clear to auscultation, no crackles or wheezes. Heart:  Normal rate and regular rhythm. S1 and S2 normal without gallop, murmur, click, rub or other extra sounds. Abdomen:  soft, normal bowel sounds, no distention, no hepatomegaly, and no splenomegaly.  Suprapubic tenderness.   Skin:  no rashes.   Cervical Nodes:  No lymphadenopathy noted Psych:  Cognition and judgment appear intact. Alert and cooperative with normal attention span and concentration. No apparent delusions, illusions, hallucinations   Impression & Recommendations:  Problem # 1:  PRURITUS (ICD-698.9) Given depomedrol 80mg  IM + 50mg  of benadryl for acute sxs. Will check labs as well .  Consider may be the geodon though had beeen on it before and had tolerated well.   Orders: T-Comprehensive Metabolic Panel [id number] [id number]) T-CBC w/Diff ([id number]) T-TSH [id number]) Depo- Medrol 80mg  (J1040) Diphenhydramine 25mg  tab (EMRORAL) Admin of Injection (IM/SQ) ([address])  Problem # 2:  FREQUENCY, URINARY (ICD-788.41) UA is neg. Will send a culture. Does have a hx o recurrent UTI.  We will call with the culture resutls.  Orders: UA Dipstick w/o Micro (automated)  (81003) T-Urine Culture (Spectrum Order) [id number])  Complete Medication List: 1)  Geodon 60 Mg Caps (Ziprasidone hcl) .... 3 times a day 2)  Ranitidine Hcl 300 Mg Tabs (Ranitidine hcl) .Marland Kitchen.. 1 tab by mouth once daily 3)  Zegerid 40-1100 Mg Caps (Omeprazole-sodium bicarbonate) .Marland Kitchen.. 1 capsule by mouth bid 4)  Levothroid 137 Mcg Tabs (Levothyroxine sodium) .Marland Kitchen.. 1 tab by mouth once daily 5)  Senna Laxative 8.6 Mg Tabs (Sennosides) .... 2 tabs  by mouth once daily 6)  Dulera 200-5 Mcg/act Aero (Mometasone furo-formoterol fum) .... 2 puffs by mouth two times a day 7)  Proair Hfa 108 (90 Base) Mcg/act Aers (Albuterol sulfate) .... 2 inh q4h as needed shortness of breath to be used as a rescue inhaler 8)  Clonidine Hcl 0.1 Mg Tabs (Clonidine hcl) .... Take 2 tablets by mouth at bedtime 9)  Topamax 100 Mg Tabs (Topiramate) .... Take 2 tablets by mouth on the mornings and one tablet by mouth at bedtime 10)  Seroquel 400 Mg Tabs (Quetiapine fumarate) .... Take one tablet by mouth at bedtime 11)  Pristiq 100 Mg Xr24h-tab (Desvenlafaxine succinate) .... Take one tablet by mouth aqt bedtime 12)  Vicodin Hp 10-660 Mg Tabs (Hydrocodone-acetaminophen) .... Take one tablet by mouth as needed for pain 13)  Butalbital-apap-caffeine 50-325-40 Mg Caps (Butalbital-apap-caffeine) .... Take one tablet by mouth as needed for pain 14)  Clindamycin Phosphate 1 % Soln (Clindamycin phosphate) .... As needed 15)  Stool Softener 100 Mg Caps (Docusate sodium) .... Takje 2 tablets by  mouth at bedtime 16)  One-a-day Extras Antioxidant Caps (Multiple vitamins-minerals) .... Take one tablet by mouth once a day 17)  Fish Oil 1000 Mg Caps (Omega-3 fatty acids) .... Take one tablet by mouth in the mornings 18)  Super Calcium/d 600-125 Mg-unit Tabs (Calcium-vitamin d) .... Take 2 tablets in the morning and 1 tablet at bedtime 19)  Lotrisone 1-0.05 % Crea (Clotrimazole-betamethasone) .... Apply twice daily to affected areas until healed. 20)  Lidoderm 5 % Ptch (Lidocaine) .... Apply to affected area 12 hours a day 21)  Lovaza 1 Gm Caps (Omega-3-acid ethyl esters) .... 4 capsules by mouth daily 22)  Simvastatin 20 Mg Tabs (Simvastatin) .... Take 1 tablet by mouth once a day at bedtime  Patient Instructions: 1)  Can use benadryl 25mg  every 6 hours as needed itching or can use zyrtec or allegra once a day 2)  Will call with the labs results likely tomorrow.  3)  consider  may need to stop the geodon.   Prescriptions: SIMVASTATIN 20 MG TABS (SIMVASTATIN) Take 1 tablet by mouth once a day at bedtime  #30 Tablet x 5   Entered and Authorized by:   Nani Gasser MD   Signed by:   Nani Gasser MD on 11/30/2010   Method used:   Electronically to        [facility] (retail)       [address]       [place], [place]  [address]       Ph: [phone]       Fax: [phone]   RxID:   [id number]    Medication Administration  Injection # 1:    Medication: Depo- Medrol 80mg     Diagnosis: PRURITUS (ICD-698.9)    Route: IM    Site: LUOQ gluteus    Exp Date: 03/21/2011    Lot #: [id number]    Mfr: [employer]    Patient tolerated injection without complications    Given by: Avon Gully CMA, Duncan Dull) (November 30, 2010 2:47 PM)  Medication # 1:    Medication: Diphenhydramine 25mg  tab    Diagnosis: PRURITUS (ICD-698.9)    Dose: 2 tablets    Route: po    Exp Date: 07/22/2011    Lot #: [id number]    Patient tolerated medication without complications    Given by: Avon Gully CMA, Duncan Dull) (November 30, 2010 2:49 PM)  Orders Added: 1)  T-Comprehensive Metabolic Panel [[id number]] 2)  T-CBC w/Diff [[id number]] 3)  T-TSH [[id number]] 4)  UA Dipstick w/o Micro (automated)  [81003] 5)  Depo- Medrol 80mg  [J1040] 6)  Diphenhydramine 25mg  tab [EMRORAL] 7)  Admin of Injection (IM/SQ) [96372] 8)  T-Urine Culture (Spectrum Order) [[id number]] 9)  Est. Patient Level IV [28[address]]    Laboratory Results   Urine Tests  Date/Time Received: 11/30/10 Date/Time Reported: 11/30/10  Routine Urinalysis   Color: yellow Appearance: Clear Glucose: negative   (Normal Range: Negative) Bilirubin: negative   (Normal Range: Negative) Ketone: negative   (Normal Range: Negative) Spec. Gravity: <1.005   (Normal Range: 1.003-1.035) Blood: negative   (Normal Range: Negative) pH: 6.0   (Normal Range: 5.0-8.0) Protein: negative   (Normal Range:  Negative) Urobilinogen: 0.2   (Normal Range: 0-1) Nitrite: negative   (Normal Range: Negative) Leukocyte Esterace: trace   (Normal Range: Negative)

## 2010-12-09 LAB — URINE CULTURE: Organism ID, Bacteria: NO GROWTH

## 2010-12-23 ENCOUNTER — Other Ambulatory Visit: Payer: Self-pay | Admitting: Family Medicine

## 2011-01-21 ENCOUNTER — Other Ambulatory Visit: Payer: Self-pay | Admitting: Family Medicine
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
## 2011-02-18 ENCOUNTER — Other Ambulatory Visit: Payer: Self-pay | Admitting: Family Medicine

## 2011-02-21 ENCOUNTER — Other Ambulatory Visit: Payer: Self-pay | Admitting: Family Medicine

## 2011-02-25 ENCOUNTER — Other Ambulatory Visit: Payer: Self-pay | Admitting: Family Medicine

## 2011-02-25 MED ORDER — LEVOTHYROXINE SODIUM 137 MCG PO TABS: 137.0000 ug | ORAL_TABLET | ORAL | Status: DC

## 2011-03-08 ENCOUNTER — Other Ambulatory Visit: Payer: Self-pay | Admitting: Family Medicine

## 2011-03-08 ENCOUNTER — Ambulatory Visit
Admission: RE | Admit: 2011-03-08 | Discharge: 2011-03-08 | Disposition: A | Discharge status: Home / Self Care | Payer: BC Managed Care – PPO | Source: Ambulatory Visit | Attending: Family Medicine | Admitting: Family Medicine

## 2011-03-08 ENCOUNTER — Encounter: Payer: Self-pay | Admitting: Family Medicine

## 2011-03-08 ENCOUNTER — Inpatient Hospital Stay
Admission: RE | Admit: 2011-03-08 | Discharge: 2011-03-08 | Disposition: A | Discharge status: Home / Self Care | Payer: BC Managed Care – PPO | Source: Ambulatory Visit | Attending: Family Medicine | Admitting: Family Medicine

## 2011-03-08 ENCOUNTER — Telehealth: Payer: Self-pay | Admitting: Family Medicine

## 2011-03-08 DIAGNOSIS — E039 Hypothyroidism, unspecified: Secondary | ICD-10-CM

## 2011-03-08 MED ORDER — LEVOTHYROXINE SODIUM 137 MCG PO TABS: 137.0000 ug | ORAL_TABLET | ORAL | Status: DC

## 2011-03-08 NOTE — Telephone Encounter (Signed)
Pt called and wants to know if she needs a TSH lab and office visit since she is only getting a 30 day supply of her medications at a time.  I tried to review but last TSH 09-2010 that looks normal.  Can we refill her additional refills?  Does pt need a office visit follow up for the thyroid ?   Please advise. .Plan:  Routed to Dr Linford Arnold Jarvis Newcomer, LPN [facility]

## 2011-03-08 NOTE — Telephone Encounter (Signed)
Pt notified that we can refill her for a#30 and 6 refills based on her last TSH level.  Will send to the pharmacy today.  LMOM for the patient. Jarvis Newcomer, LPN [facility]

## 2011-03-08 NOTE — Telephone Encounter (Signed)
WE can refill for 90 day supply if she would like with 3 refills.

## 2011-03-10 ENCOUNTER — Telehealth (INDEPENDENT_AMBULATORY_CARE_PROVIDER_SITE_OTHER): Payer: Self-pay | Admitting: *Deleted

## 2011-03-22 ENCOUNTER — Encounter: Payer: Self-pay | Admitting: Family Medicine

## 2011-03-23 ENCOUNTER — Encounter: Payer: Self-pay | Admitting: Family Medicine

## 2011-03-23 ENCOUNTER — Ambulatory Visit (INDEPENDENT_AMBULATORY_CARE_PROVIDER_SITE_OTHER): Payer: BC Managed Care – PPO | Admitting: Family Medicine

## 2011-03-23 DIAGNOSIS — G43909 Migraine, unspecified, not intractable, without status migrainosus: Secondary | ICD-10-CM

## 2011-03-23 DIAGNOSIS — M542 Cervicalgia: Secondary | ICD-10-CM

## 2011-03-23 MED ORDER — PROPRANOLOL HCL 40 MG PO TABS: 40.0000 mg | ORAL_TABLET | Freq: Two times a day (BID) | ORAL | Status: DC

## 2011-03-23 MED ORDER — PROMETHAZINE HCL 25 MG/ML IJ SOLN
25.0000 mg | Freq: Once | INTRAMUSCULAR | Status: AC
  Administered 2011-03-23: 25 mg via INTRAMUSCULAR

## 2011-03-23 MED ORDER — KETOROLAC TROMETHAMINE 60 MG/2ML IM SOLN
60.0000 mg | Freq: Once | INTRAMUSCULAR | Status: AC
  Administered 2011-03-23: 60 mg via INTRAMUSCULAR

## 2011-03-23 MED ORDER — MAGNESIUM OXIDE 400 MG PO TABS: 400.0000 mg | ORAL_TABLET | Freq: Every day | ORAL | Status: DC

## 2011-03-23 MED ORDER — PREDNISONE 10 MG PO TABS: ORAL_TABLET | ORAL | Status: DC

## 2011-03-23 NOTE — Assessment & Plan Note (Signed)
We discussed different options. Most importantly we need to get her acute migraine under control. She is to stop all over-the-counter medications which includes generic form of Excedrin as well as Tylenol products. I do think she has an element of rebound headache at this point in time. Then she can start a steroid taper over the next 5 days. Her headache may get worse initially. We discussed the importance of completely cutting out caffeine. She currently drinks 3 cups of coffee daily. Also she is extremely heavy smoker and working on cutting back on her smoking would be helpful as well. She also needs something for prophylaxis. We will start with a calcium channel blocker. I would like to see her back in one month. Also she has a significant amount of pain and neck stiffness and tension. I recommended physical therapy because if she doesn't get the musculoskeletal element treated and she will continue to have recurrent migraines. Patient was amenable to current care plan.

## 2011-03-23 NOTE — Progress Notes (Signed)
Subjective:    Patient ID: Mandy Shaw, female    DOB: 1961/02/21, 50 y.o.   MRN: [id number]  HPI Migraines for the last 10 weeks. Went to UC about 3 weeks ago and had a normal head CT.  Has pain in her shoulders that radiates into her arm. Getting tingling in her hands.  At the ED given fiorcet - didn't work.  Taking OTC migraine med. Sensitve to sound and light.  Went off her topamax around this time because it was making her numb. Went off the seroquel a month before the topamax.  Does drink 3 cups of coffee. She really does not want to go back on the Topamax which she was on for years because after coming off of it she feels that she has much more mental clarity. Her sex tried has significantly improved. She feels like she's actually interacting more with her spouse and family members. She does not have an aura  She is here today with her husband  BP 120/82  Pulse 70  Ht 5\' 5"  (1.651 m)  Wt 186 lb (84.369 kg)  BMI 30.95 kg/m2    Allergies  Allergen Reactions  . Penicillins     REACTION: Anaphalatic  . Sulfonamide Derivatives     REACTION: Anaphalatic    Past Medical History  Diagnosis Date  . COPD (chronic obstructive pulmonary disease)   . Thyroid disease   . Insomnia   . GERD (gastroesophageal reflux disease)   . Arthritis   . Bipolar 1 disorder   . Personality disorder     Past Surgical History  Procedure Date  . Cesarean section   . Cholecystectomy   . Thyroidectomy   . Leep   . Incontinence surgery   . Eye surgery   . Induced abortion     History   Social History  . Marital Status: Married    Spouse Name: N/A    Number of Children: N/A  . Years of Education: N/A   Occupational History  . Not on file.   Social History Main Topics  . Smoking status: Current Everyday Smoker -- 1.0 packs/day for 40 years    Types: Cigarettes  . Smokeless tobacco: Not on file  . Alcohol Use: Yes     socially  . Drug Use: No  . Sexually Active:      married, 1  daughter, 1  1/2 yrs college, self-employed, no reg exercise, 3 caffeine drinks daily.   Other Topics Concern  . Not on file   Social History Narrative  . No narrative on file    Family History  Problem Relation Age of Onset  . Arthritis      family history  . Stroke      family history  . Depression      siblings  . Alcohol abuse      siblings  . Alcohol abuse      father's family   Review of Systems     Objective:   Physical Exam  Constitutional: She is oriented to person, place, and time. She appears well-developed and well-nourished.  HENT:  Head: Normocephalic and atraumatic.       She is squinting.  Neurological: She is alert and oriented to person, place, and time.  Psychiatric: She has a normal mood and affect. Her behavior is normal. Judgment and thought content normal.       The speech is somewhat slow          Assessment &  Plan:

## 2011-03-23 NOTE — Patient Instructions (Signed)
Stop all caffeine Start magnesium oxide 400mg  once a day Start propranolol 40mg  twice a day Start the steroid regimen.  Stop all over the counter pain relievers We will call you with the Physical Therapy referrral.

## 2011-03-31 ENCOUNTER — Ambulatory Visit: Payer: BC Managed Care – PPO | Attending: Family Medicine | Admitting: Physical Therapy

## 2011-03-31 DIAGNOSIS — IMO0001 Reserved for inherently not codable concepts without codable children: Secondary | ICD-10-CM | POA: Insufficient documentation

## 2011-03-31 DIAGNOSIS — M2560 Stiffness of unspecified joint, not elsewhere classified: Secondary | ICD-10-CM | POA: Insufficient documentation

## 2011-03-31 DIAGNOSIS — M542 Cervicalgia: Secondary | ICD-10-CM | POA: Insufficient documentation

## 2011-03-31 DIAGNOSIS — M2550 Pain in unspecified joint: Secondary | ICD-10-CM | POA: Insufficient documentation

## 2011-04-02 ENCOUNTER — Ambulatory Visit: Payer: BC Managed Care – PPO | Admitting: Physical Therapy

## 2011-04-05 ENCOUNTER — Ambulatory Visit: Payer: BC Managed Care – PPO | Admitting: Physical Therapy

## 2011-04-07 ENCOUNTER — Ambulatory Visit: Payer: BC Managed Care – PPO | Admitting: Physical Therapy

## 2011-04-09 ENCOUNTER — Encounter: Payer: BC Managed Care – PPO | Admitting: Physical Therapy

## 2011-04-13 ENCOUNTER — Ambulatory Visit: Payer: BC Managed Care – PPO | Admitting: Family Medicine

## 2011-04-15 ENCOUNTER — Encounter: Payer: Self-pay | Admitting: Family Medicine

## 2011-04-15 ENCOUNTER — Ambulatory Visit (INDEPENDENT_AMBULATORY_CARE_PROVIDER_SITE_OTHER): Payer: BC Managed Care – PPO | Admitting: Family Medicine

## 2011-04-15 ENCOUNTER — Ambulatory Visit
Admission: RE | Admit: 2011-04-15 | Discharge: 2011-04-15 | Disposition: A | Discharge status: Home / Self Care | Payer: Medicare Other | Source: Ambulatory Visit | Attending: Family Medicine | Admitting: Family Medicine

## 2011-04-15 DIAGNOSIS — M549 Dorsalgia, unspecified: Secondary | ICD-10-CM

## 2011-04-15 DIAGNOSIS — M542 Cervicalgia: Secondary | ICD-10-CM

## 2011-04-15 DIAGNOSIS — G43909 Migraine, unspecified, not intractable, without status migrainosus: Secondary | ICD-10-CM

## 2011-04-15 MED ORDER — HYDROCODONE-ACETAMINOPHEN 5-325 MG PO TABS: 1.0000 | ORAL_TABLET | Freq: Three times a day (TID) | ORAL | Status: DC | PRN

## 2011-04-15 NOTE — Progress Notes (Signed)
Subjective:    Patient ID: Mandy Shaw, female    DOB: 1961-03-06, 50 y.o.   MRN: [id number]  HPI  neck and shoulder pain-Went to PT for her neck and shoulder pain for 2 sessions and it was getting worse.  She has also exacerbated her headaches. She is still off the Topamax which worked well for her in the past but caused some mood changes. She says she has been getting very irritable since starting the inderal and the magnesium. Says having mood swings. Still with sig pain in her neck, shoulders, and between the shoulder blades. It radiates down both arms at times. At times she is even nauseated. She does complain of occasional weakness in her arms bilaterally  Migraines - Says stopped the ASA, execedrin , IBU, etc and still having severe HA.  She was so like something else for pain since she has stopped all of his other products. She still describes her headache as severe throbbing.     Review of Systems  BP 120/79  Pulse 87  Ht 5\' 5"  (1.651 m)  Wt 181 lb (82.101 kg)  BMI 30.12 kg/m2    Allergies  Allergen Reactions  . Penicillins     REACTION: Anaphalatic  . Sulfonamide Derivatives     REACTION: Anaphalatic    Past Medical History  Diagnosis Date  . COPD (chronic obstructive pulmonary disease)   . Thyroid disease   . Insomnia   . GERD (gastroesophageal reflux disease)   . Arthritis   . Bipolar 1 disorder   . Personality disorder     Past Surgical History  Procedure Date  . Cesarean section   . Cholecystectomy   . Thyroidectomy   . Leep   . Incontinence surgery   . Eye surgery   . Induced abortion     History   Social History  . Marital Status: Married    Spouse Name: N/A    Number of Children: N/A  . Years of Education: N/A   Occupational History  . Not on file.   Social History Main Topics  . Smoking status: Current Everyday Smoker -- 1.0 packs/day for 40 years    Types: Cigarettes  . Smokeless tobacco: Not on file  . Alcohol Use: Yes     socially    . Drug Use: No  . Sexually Active:      married, 1 daughter, 1  1/2 yrs college, self-employed, no reg exercise, 3 caffeine drinks daily.   Other Topics Concern  . Not on file   Social History Narrative  . No narrative on file    Family History  Problem Relation Age of Onset  . Arthritis      family history  . Stroke      family history  . Depression      siblings  . Alcohol abuse      siblings  . Alcohol abuse      father's family    Mandy Shaw does not currently have medications on file.     Objective:   Physical Exam  Constitutional: She appears well-developed and well-nourished.  Cardiovascular: Normal rate, regular rhythm and normal heart sounds.   Pulmonary/Chest: Effort normal and breath sounds normal.  Neurological:       Neck with dec extension and rotation right and left. Dec side bending to the left.  UE with NROM but has sig pain with full extension into her upper arms.   Skin: Skin is warm and dry.  Psychiatric: She has a normal mood and affect.          Assessment & Plan:  Migraines - is explained to her that magnesium and inderal would be very unusual to cause inc irritability taht she is experiencing. Certainly she can stop the magnesium and see if helps. Then if not can stop the inderal and see if helps. For short term can use the hydrocodone prn. I would like to refer hre to neuro. Also I think her neck pain could be contributing to her HA. Certainly she started PT and felt worse so will get xrays of her neck today. Consider she may have a herniated disc.   Neck pain-Certainly she started PT and felt worse so will get xrays of her neck today. Consider she may have a herniated disc. Certainly this could be exacerbating some of her headaches. I'm also concerned as she has difficulty raising her arms past 90 secondary to pain and discomfort.

## 2011-04-15 NOTE — Patient Instructions (Signed)
Certainly she can stop the magnesium and see if helps. Then if not can stop the inderal and see if helps. We will call you with the xray results i am gong to refer you to neurology.

## 2011-04-16 ENCOUNTER — Telehealth: Payer: Self-pay | Admitting: Family Medicine

## 2011-04-16 NOTE — Telephone Encounter (Signed)
Call patient: X-ray of the thoracic spine is normal. X-ray of the neck shows that she does have muscle spasm. She has tried physical therapy and didn't do well I would like to get an MRI of her neck. If she is okay with this, let me know and we can get scheduled for next week.

## 2011-04-16 NOTE — Telephone Encounter (Signed)
Left message on vm with results and to call back if wants MRI scheduled

## 2011-04-17 ENCOUNTER — Other Ambulatory Visit: Payer: Self-pay | Admitting: Family Medicine

## 2011-04-19 ENCOUNTER — Other Ambulatory Visit: Payer: Self-pay | Admitting: Family Medicine

## 2011-04-19 DIAGNOSIS — M542 Cervicalgia: Secondary | ICD-10-CM

## 2011-04-19 NOTE — Telephone Encounter (Signed)
Pt contacted and told the provider was getting the MRI taken care of and will get scheduled for this week.  As far as the pain med told the patient to call back on the day she has only two pills left and I;ll take care of the pain med for her.  Pt voiced understanding. Jarvis Newcomer, LPN [facility]

## 2011-04-19 NOTE — Telephone Encounter (Signed)
Her neuro appt isn't until 05-03-01. We haven't schedule her MRI yet but we will get that done this week so see about pain needs. Will put in order for MRI this AM.

## 2011-04-19 NOTE — Telephone Encounter (Signed)
Pt called and is informing nurse that can't get on schedule for the MRI until 05-04-11.  Dr Linford Arnold Rx hydrocodone for the pain and only given # 20.  Will be out by the time her appt rolls around.  Pt will only have two doses for 04-21-11 and then will be out.  Can we send a new script effective 04-21-11 to cover pt until she can get in for MRI on 05-04-11??  Please advise since pt using TID. ROuted to Dr. Marlyne Beards, LPN [facility]

## 2011-04-21 ENCOUNTER — Telehealth: Payer: Self-pay | Admitting: Family Medicine

## 2011-04-21 MED ORDER — HYDROCODONE-ACETAMINOPHEN 5-325 MG PO TABS: 1.0000 | ORAL_TABLET | Freq: Three times a day (TID) | ORAL | Status: DC | PRN

## 2011-04-21 NOTE — Telephone Encounter (Signed)
OK to refill. When is her MRI scheduled?

## 2011-04-21 NOTE — Telephone Encounter (Signed)
Pt called for refill of her hydrocodone.  Was only given # 20 pills on the last refill which was 04-15-11.  Technically, she would be out if taking three daily with only two doses left today if I've figured correctly.  Can I refill??  Had recent office visit. Routed to Dr. Marlyne Beards, LPN [facility]'

## 2011-04-21 NOTE — Telephone Encounter (Signed)
Pt informed script sent to her pharm, and her MRI is scheduled for 05-04-11. Jarvis Newcomer, LPN [facility]

## 2011-04-27 ENCOUNTER — Other Ambulatory Visit: Payer: Self-pay | Admitting: Family Medicine

## 2011-04-27 NOTE — Telephone Encounter (Addendum)
Pt calling for a RF of NOrco.  She has an MRI scheduled on 05-04-11.  Can we refill the norco until then??  Please advise.  Last refill given on 04-21-11 for # 20. Routed to Dr. Marlyne Beards, LPN [facility]

## 2011-04-28 ENCOUNTER — Other Ambulatory Visit: Payer: Self-pay | Admitting: Family Medicine

## 2011-04-28 MED ORDER — HYDROCODONE-ACETAMINOPHEN 5-325 MG PO TABS: 1.0000 | ORAL_TABLET | Freq: Three times a day (TID) | ORAL | Status: AC | PRN

## 2011-04-28 NOTE — Telephone Encounter (Signed)
Notified the pt that her Norco would be refilled for # 15/0 refills and should last until her MRI appt on 05-04-11.  Pt voiced understanding. Jarvis Newcomer, LPN [facility]

## 2011-04-28 NOTE — Telephone Encounter (Signed)
Can refill 15 tabs to last until her MRI.

## 2011-04-28 NOTE — Telephone Encounter (Signed)
Closed encounter °

## 2011-05-06 ENCOUNTER — Other Ambulatory Visit: Payer: Self-pay | Admitting: Neurology

## 2011-05-06 DIAGNOSIS — M542 Cervicalgia: Secondary | ICD-10-CM

## 2011-05-18 ENCOUNTER — Other Ambulatory Visit: Payer: Self-pay | Admitting: *Deleted

## 2011-05-18 MED ORDER — OMEPRAZOLE-SODIUM BICARBONATE 40-1100 MG PO CAPS: 1.0000 | ORAL_CAPSULE | Freq: Two times a day (BID) | ORAL | Status: DC

## 2011-05-22 ENCOUNTER — Ambulatory Visit
Admission: RE | Admit: 2011-05-22 | Discharge: 2011-05-22 | Disposition: A | Discharge status: Home / Self Care | Payer: BC Managed Care – PPO | Source: Ambulatory Visit | Attending: Neurology | Admitting: Neurology

## 2011-05-22 DIAGNOSIS — M542 Cervicalgia: Secondary | ICD-10-CM

## 2011-08-19 ENCOUNTER — Other Ambulatory Visit: Payer: Self-pay | Admitting: Family Medicine

## 2011-08-23 NOTE — Progress Notes (Signed)
Summary: MIGRAINE (rm 5)   Vital Signs:  Patient Profile:   50 Years Old Female CC:      migraine Height:     65 inches Weight:      185 pounds O2 Sat:      96 % O2 treatment:    Room Air Temp:     98.8 degrees F oral Pulse rate:   93 / minute Resp:     16 per minute BP sitting:   104 / 69  (left arm) Cuff size:   large  Vitals Entered By: Lajean Saver RN (March 08, 2011 1:32 PM)                  Updated Prior Medication List: GEODON 60 MG CAPS (ZIPRASIDONE HCL) 3 times a day RANITIDINE HCL 300 MG TABS (RANITIDINE HCL) 1 tab by mouth once daily ZEGERID 40-1100 MG CAPS (OMEPRAZOLE-SODIUM BICARBONATE) 1 capsule by mouth bid LEVOTHROID 137 MCG TABS (LEVOTHYROXINE SODIUM) 1 tab by mouth once daily SENNA LAXATIVE 8.6 MG TABS (SENNOSIDES) 2 tabs by mouth once daily DULERA 200-5 MCG/ACT AERO (MOMETASONE FURO-FORMOTEROL FUM) 2 puffs by mouth two times a day PROAIR HFA 108 (90 BASE) MCG/ACT  AERS (ALBUTEROL SULFATE) 2 inh q4h as needed shortness of breath to be used as a rescue inhaler CLONIDINE HCL 0.1 MG TABS (CLONIDINE HCL) Take 2 tablets by mouth at bedtime TOPAMAX 100 MG TABS (TOPIRAMATE) take 2 tablets by mouth on the mornings and one tablet by mouth at bedtime SEROQUEL 400 MG TABS (QUETIAPINE FUMARATE) take one tablet by mouth at bedtime PRISTIQ 100 MG XR24H-TAB (DESVENLAFAXINE SUCCINATE) take one tablet by mouth aqt bedtime VICODIN HP 10-660 MG TABS (HYDROCODONE-ACETAMINOPHEN) take one tablet by mouth as needed for pain BUTALBITAL-APAP-CAFFEINE 50-325-40 MG CAPS (BUTALBITAL-APAP-CAFFEINE) take one tablet by mouth as needed for pain CLINDAMYCIN PHOSPHATE 1 % SOLN (CLINDAMYCIN PHOSPHATE) as needed STOOL SOFTENER 100 MG CAPS (DOCUSATE SODIUM) takje 2 tablets by mouth at bedtime ONE-A-DAY EXTRAS ANTIOXIDANT  CAPS (MULTIPLE VITAMINS-MINERALS) take one tablet by mouth once a day FISH OIL 1000 MG CAPS (OMEGA-3 FATTY ACIDS) Take one tablet by mouth in the mornings SUPER CALCIUM/D  600-125 MG-UNIT TABS (CALCIUM-VITAMIN D) take 2 tablets in the morning and 1 tablet at bedtime LOTRISONE 1-0.05 % CREA (CLOTRIMAZOLE-BETAMETHASONE) apply twice daily to affected areas until healed. LIDODERM 5 % PTCH (LIDOCAINE) apply to affected area 12 hours a day LOVAZA 1 GM CAPS (OMEGA-3-ACID ETHYL ESTERS) 4 capsules by mouth daily SIMVASTATIN 20 MG TABS (SIMVASTATIN) Take 1 tablet by mouth once a day at bedtime ATIVAN 0.5 MG TABS (LORAZEPAM) bid  Current Allergies (reviewed today): ! SULFA ! PENICILLIN ! * BEE STINGSHistory of Present Illness Chief Complaint: migraine History of Present Illness: atient reports 2 month HX of migraines. She states the headaches feel like her typical migraines and she has had some long migraines but none this long and last imaging of her head was 9 years ago.   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) PTSD (ICD-309.81) BIPOLAR AFFECTIVE DISORDER, MANIC (ICD-296.40) BRONCHITIS, OBSTRUCTIVE CHRONIC W/EXACERB (ICD-491.21) RHEUMATOID ARTHRITIS (ICD-714.0) COPD (ICD-496)   Current Meds GEODON 60 MG CAPS (ZIPRASIDONE HCL) 3 times a day RANITIDINE HCL 300 MG TABS (RANITIDINE HCL) 1 tab by mouth once daily ZEGERID 40-1100 MG CAPS (OMEPRAZOLE-SODIUM BICARBONATE) 1 capsule by mouth bid LEVOTHROID  137 MCG TABS (LEVOTHYROXINE SODIUM) 1 tab by mouth once daily SENNA LAXATIVE 8.6 MG TABS (SENNOSIDES) 2 tabs by mouth once daily DULERA 200-5 MCG/ACT AERO (MOMETASONE FURO-FORMOTEROL FUM) 2 puffs by mouth two times a day PROAIR HFA 108 (90 BASE) MCG/ACT  AERS (ALBUTEROL SULFATE) 2 inh q4h as needed shortness of breath to be used as a rescue inhaler CLONIDINE HCL  0.1 MG TABS (CLONIDINE HCL) Take 2 tablets by mouth at bedtime TOPAMAX 100 MG TABS (TOPIRAMATE) take 2 tablets by mouth on the mornings and one tablet by mouth at bedtime SEROQUEL 400 MG TABS (QUETIAPINE FUMARATE) take one tablet by mouth at bedtime PRISTIQ 100 MG XR24H-TAB (DESVENLAFAXINE SUCCINATE) take one tablet by mouth aqt bedtime VICODIN HP 10-660 MG TABS (HYDROCODONE-ACETAMINOPHEN) take one tablet by mouth as needed for pain BUTALBITAL-APAP-CAFFEINE 50-325-40 MG CAPS (BUTALBITAL-APAP-CAFFEINE) take one tablet by mouth as needed for pain CLINDAMYCIN PHOSPHATE 1 % SOLN (CLINDAMYCIN PHOSPHATE) as needed STOOL SOFTENER 100 MG CAPS (DOCUSATE SODIUM) takje 2 tablets by mouth at bedtime ONE-A-DAY EXTRAS ANTIOXIDANT  CAPS (MULTIPLE VITAMINS-MINERALS) take one tablet by mouth once a day FISH OIL 1000 MG CAPS (OMEGA-3 FATTY ACIDS) Take one tablet by mouth in the mornings SUPER CALCIUM/D 600-125 MG-UNIT TABS (CALCIUM-VITAMIN D) take 2 tablets in the morning and 1 tablet at bedtime LOTRISONE 1-0.05 % CREA (CLOTRIMAZOLE-BETAMETHASONE) apply twice daily to affected areas until healed. LIDODERM 5 % PTCH (LIDOCAINE) apply to affected area 12 hours a day LOVAZA 1 GM CAPS (OMEGA-3-ACID ETHYL ESTERS) 4 capsules by mouth daily SIMVASTATIN 20 MG TABS (SIMVASTATIN) Take 1 tablet by mouth once a day at bedtime ATIVAN 0.5 MG TABS (LORAZEPAM) bid IMITREX 100 MG TABS (SUMATRIPTAN SUCCINATE) 1 by mouth with on set of migraines may repeatin 1 hr with maximum 2 dosages in 24 hrs IMITREX 100 MG TABS (SUMATRIPTAN SUCCINATE) 1 by mouth at the onset of migraine. May repeat dose in one hour if headache not resolved maximum2 dosages in 24 hrs ZOFRAN ODT 8 MG TBDP (ONDANSETRON) 1 by mouth q 8hrs as needed FIORICET 50-325-40 MG TABS (BUTALBITAL-APAP-CAFFEINE) 1 ppoq 8hrs as needed for headache  REVIEW OF SYSTEMS Constitutional Symptoms      Denies fever, chills, night sweats, weight loss, weight gain, and fatigue.  Eyes         Denies change in vision, eye pain, eye discharge, glasses, contact lenses, and eye surgery. Ear/Nose/Throat/Mouth       Denies hearing loss/aids, change in hearing, ear pain, ear discharge, dizziness, frequent runny nose, frequent nose bleeds, sinus problems, sore throat, hoarseness, and tooth pain or bleeding.  Respiratory       Denies dry cough, productive cough, wheezing, shortness of breath, asthma, bronchitis, and emphysema/COPD.  Cardiovascular       Denies murmurs, chest pain, and tires easily with exhertion.    Gastrointestinal       Complains of nausea/vomiting.      Denies stomach pain, diarrhea, constipation, blood in bowel movements, and indigestion.      Comments: nausea only Genitourniary       Denies painful urination, kidney stones, and loss of urinary control. Neurological       Complains of headaches.      Denies paralysis, seizures, and fainting/blackouts. Musculoskeletal       Denies muscle pain, joint pain, joint stiffness, decreased range of motion, redness, swelling, muscle weakness, and gout.  Skin       Denies bruising, unusual mles/lumps or sores, and hair/skin or nail changes.  Psych  Denies mood changes, temper/anger issues, anxiety/stress, speech problems, depression, and sleep problems. Other Comments: Patient c/o migraines 6 days a week x 2 months. She has not seen her HA specialist in several months. Patient says the last time she saw Dr. Linford Arnold she did not address her HA issues. She is taking ibuprofen and generic migraines medication OTC for HAs   Past History:  Family History: Last updated: 08/22/2009 Family History of Arthritis Family History of Stroke F 1st degree relative <60 Father with alcoholism, stroke Siblings with depression, ETOH.  Fathers family with alcoholism   Social History: Last updated: 08/22/2009 Married to [place]. [place] daughter. 1 1/2 years college.  Self  employed.   Current Smoker - 1 pack daily for 40 years.   Alcohol use-yes - socially Drug use-no Regular exercise-no 3 caffeinated drinks per day.   Risk Factors: Alcohol Use: <1 (08/22/2009) Exercise: no (08/22/2009)  Risk Factors: Smoking Status: current (08/22/2009) Packs/Day: 1.0 (08/22/2009)  Past Medical History: Reviewed history from 08/22/2009 and no changes required. COPD Thyroid Sleep insomia Acid Reflux Rheumatoid arthritis Bipolar - Treated by Dr. Sandria Manly (psych in [facility]) [phone] Personality disorder  Past Surgical History: Reviewed history from 08/22/2009 and no changes required. Caesarean section Cholecystectomy Thyroidectomy - hemi LEEP Procedure - 2 Bladder sling procedure - 2 Eye surgery Abortion of pregnancy  Family History: Reviewed history from 08/22/2009 and no changes required. Family History of Arthritis Family History of Stroke F 1st degree relative <60 Father with alcoholism, stroke Siblings with depression, ETOH.  Fathers family with alcoholism   Social History: Reviewed history from 08/22/2009 and no changes required. Married to [place]. [place] daughter. 1 1/2 years college.  Self  employed.   Current Smoker - 1 pack daily for 40 years.  Alcohol use-yes - socially Drug use-no Regular exercise-no 3 caffeinated drinks per day.  Physical Exam General appearance: well developed, well nourished, moderate distress Head: normocephalic, atraumatic, anterior cervical lymphadenopathy Eyes: conjunctivae and lids normal Pupils: equal, round, reactive to light Ears: normal, no lesions or deformities Oral/Pharynx: tongue normal, posterior pharynx without erythema or exudate Neck: neck supple,  trachea midline, no masses Neurological: grossly intact and non-focal Skin: no obvious rashes or lesions MSE: oriented to time, place, and person Assessment New Problems: NAUSEA AND VOMITING (ICD-787.01) MIGRAINE (ICD-346.00)  migraines  Patient Education: Patient and/or caregiver instructed in the  following: rest fluids and Tylenol.  Plan New Medications/Changes: FIORICET 50-325-40 MG TABS (BUTALBITAL-APAP-CAFFEINE) 1 ppoq 8hrs as needed for headache  #30 x 0, 03/08/2011, Hassan Rowan MD ZOFRAN ODT 8 MG TBDP (ONDANSETRON) 1 by mouth q 8hrs as needed  #20 x 1, 03/08/2011, Hassan Rowan MD IMITREX 100 MG TABS (SUMATRIPTAN SUCCINATE) 1 by mouth at the onset of migraine. May repeat dose in one hour if headache not resolved maximum2 dosages in 24 hrs  #10 x 1, 03/08/2011, Hassan Rowan MD IMITREX 100 MG TABS (SUMATRIPTAN SUCCINATE) 1 by mouth with on set of migraines may repeatin 1 hr with maximum 2 dosages in 24 hrs  #10 x 1, 03/08/2011, Hassan Rowan MD  New Orders: T-CT Head w/o cm [70450] Ketorolac-Toradol 15mg  [U9811] Sumatriptan Succinate /6mg  injection [J3030] Est. Patient Level IV [91478] Promethazine up to 50mg  [J2550] Admin of Therapeutic Inj  intramuscular or subcutaneous [96372] Zofran 4mg  Tab [EMRORAL] Zofran 4mg  Tab [EMRORAL] Sumatriptan Succinate /6mg  injection [J3030] Sumatriptan Succinate /6mg  injection [J3030] Planning Comments:   s  Follow Up: Follow up in 2-3 days if no improvement, Follow up on an  as needed basis, Follow up with Primary Physician  The patient and/or caregiver has been counseled thoroughly with regard to medications prescribed including dosage, schedule, interactions, rationale for use, and possible side effects and they verbalize understanding.  Diagnoses and expected course of recovery discussed and will return if not improved as expected or if the condition worsens. Patient and/or caregiver verbalized understanding.  Prescriptions: FIORICET 50-325-40 MG TABS (BUTALBITAL-APAP-CAFFEINE) 1 ppoq 8hrs as needed for headache  #30 x 0   Entered and Authorized by:   Hassan Rowan MD   Signed by:   Hassan Rowan MD on 03/08/2011   Method used:   Printed then faxed to ...       [facility]  [address] (retail)       [address]       [place], [place]   [address]       Ph: [phone]       Fax: [phone]   RxID:   [id number] ZOFRAN ODT 8 MG TBDP (ONDANSETRON) 1 by mouth q 8hrs as needed  #20 x 1   Entered and Authorized by:   Hassan Rowan MD   Signed by:   Hassan Rowan MD on 03/08/2011   Method used:   Printed then faxed to ...       [facility]  [address] (retail)       [address]       [place], [place]  [address]       Ph: [phone]       Fax: [phone]   RxID:   [id number] IMITREX 100 MG TABS (SUMATRIPTAN SUCCINATE) 1 by mouth at the onset of migraine. May repeat dose in one hour if headache not resolved maximum2 dosages in 24 hrs  #10 x 1   Entered and Authorized by:   Hassan Rowan MD   Signed by:   Hassan Rowan MD on 03/08/2011   Method used:   Printed then faxed to ...       [facility]  [address] (retail)       [address]       [place], [place]  [address]       Ph: [phone]       Fax: [phone]   RxID:   [id number] IMITREX 100 MG TABS (SUMATRIPTAN SUCCINATE) 1 by mouth with on set of migraines may repeatin 1 hr with maximum 2 dosages in 24 hrs  #10 x 1   Entered and Authorized by:   Hassan Rowan MD   Signed by:   Hassan Rowan MD on 03/08/2011   Method used:   Electronically to        [facility]  [address] (retail)       [address]       [place], [place]  [address]       Ph: [phone]       Fax: [phone]   RxID:   [id number]   Patient Instructions: 1)  Please schedule a follow-up appointment as needed especially if migraine persists 2)  Please schedule an appointment with your primary doctor in :7-14 days 3)  Medically cleared to return to work; return to work note providedif needed to be out until 03/10/2011. 4)  Tobacco is very bad for your health and your loved ones! You Should stop smoking!. 5)  Stop Smoking Tips: Choose a Quit date. Cut down before the Quit date. decide what you will do as a substitute  when you feel the urge to smoke(gum,toothpick,exercise).  Medication  Administration  Injection # 1:    Medication: Promethazine up to 50mg     Diagnosis: NAUSEA AND VOMITING (ICD-787.01)    Route: IM    Site: RUOQ gluteus    Exp Date: 08/20/2013    Lot #: [id number]    Mfr: [email or web address]    Comments: 25mg  given    Patient tolerated injection without complications    Given by: Clemens Catholic LPN (March 08, 2011 2:48 PM)  Injection # 2:    Medication: Ketorolac-Toradol 15mg     Diagnosis: MIGRAINE (ICD-346.00)    Route: IM    Site: LUOQ gluteus    Exp Date: 04/20/2012    Lot #: [id number]    Mfr: [email or web address]    Comments: 60 mg given     Patient tolerated injection without complications    Given by: Clemens Catholic LPN (March 08, 2011 2:49 PM)  Injection # 3:    Medication: sumatriptan 6mg     Diagnosis: MIGRAINE (ICD-346.00)    Route: SQ    Site: left thigh    Exp Date: 03/20/2012    Lot #: [id number]    Mfr: [email or web address]    Patient tolerated injection without complications    Given by: Lajean Saver RN (March 08, 2011 2:52 PM)  Injection # 4:    Medication: sumatriptan 6mg     Diagnosis: MIGRAINE (ICD-346.00)    Route: SQ    Site: R thigh    Exp Date: 10/22/2011    Lot #: [id number]    Mfr: [employer]    Patient tolerated injection without complications    Given by: Clemens Catholic LPN (March 08, 2011 4:19 PM)  Medication # 1:    Medication: Zofran 4mg  Tab    Diagnosis: NAUSEA AND VOMITING (ICD-787.01)    Dose: 8mg      Route: po    Exp Date: 07/21/2012    Lot #: [id number]    Mfr: UDL    Comments: 8 mg given     Patient tolerated medication without complications    Given by: Clemens Catholic LPN (March 08, 2011 4:20 PM)  Orders Added: 1)  T-CT Head w/o cm [70450] 2)  Ketorolac-Toradol 15mg  [J1885] 3)  Sumatriptan Succinate /6mg  injection [J3030] 4)  Est. Patient Level IV [69629] 5)  Promethazine up to 50mg  [J2550] 6)  Admin of Therapeutic Inj  intramuscular or subcutaneous [96372] 7)  Zofran 4mg  Tab [EMRORAL] 8)  Zofran 4mg  Tab  [EMRORAL] 9)  Sumatriptan Succinate /6mg  injection [J3030] 10)  Sumatriptan Succinate /6mg  injection [J3030]

## 2011-08-23 NOTE — Telephone Encounter (Signed)
  Phone Note Outgoing Call Call back at Home Phone [phone] P PH     Call placed by: Lajean Saver RN,  March 10, 2011 3:28 PM Call placed to: Patient Action Taken: Phone Call Completed Summary of Call: Callback: Patient reports her head still hurts a little but it is much imporved. She will call with any concerns.

## 2011-09-16 ENCOUNTER — Other Ambulatory Visit: Payer: Self-pay | Admitting: Family Medicine

## 2011-09-23 ENCOUNTER — Encounter: Payer: Self-pay | Admitting: *Deleted

## 2011-09-23 ENCOUNTER — Emergency Department (INDEPENDENT_AMBULATORY_CARE_PROVIDER_SITE_OTHER)
Admission: EM | Admit: 2011-09-23 | Discharge: 2011-09-23 | Disposition: A | Discharge status: Home / Self Care | Payer: BC Managed Care – PPO | Source: Home / Self Care | Attending: Emergency Medicine | Admitting: Emergency Medicine

## 2011-09-23 ENCOUNTER — Emergency Department
Admit: 2011-09-23 | Discharge: 2011-09-23 | Disposition: A | Discharge status: Home / Self Care | Payer: BC Managed Care – PPO | Attending: Emergency Medicine | Admitting: Emergency Medicine

## 2011-09-23 DIAGNOSIS — M25579 Pain in unspecified ankle and joints of unspecified foot: Secondary | ICD-10-CM

## 2011-09-23 DIAGNOSIS — M25531 Pain in right wrist: Secondary | ICD-10-CM

## 2011-09-23 DIAGNOSIS — M25569 Pain in unspecified knee: Secondary | ICD-10-CM

## 2011-09-23 DIAGNOSIS — M25572 Pain in left ankle and joints of left foot: Secondary | ICD-10-CM

## 2011-09-23 DIAGNOSIS — M25539 Pain in unspecified wrist: Secondary | ICD-10-CM

## 2011-09-23 MED ORDER — MELOXICAM 7.5 MG PO TABS: 7.5000 mg | ORAL_TABLET | Freq: Two times a day (BID) | ORAL | Status: DC | PRN

## 2011-09-23 NOTE — ED Provider Notes (Signed)
Your or her History     CSN: [id number]  Arrival date & time 09/23/11  0820   First MD Initiated Contact with Patient 09/23/11 [phone]      Chief Complaint  Patient presents with  . Ankle Pain  . Wrist Pain    (Consider location/radiation/quality/duration/timing/severity/associated sxs/prior treatment) HPI On Christmas Eve which was about 1-1/2 weeks ago, she was walking down the stairs to take her trash out and felt pain in her left ankle and had to catch herself with her right wrist against a wall. Since then she's been experiencing pain in her left lateral ankle as well as her right wrist. She thinks that it is getting worse. She has not been using any kind of medications or modalities for pain. No previous injuries. She has been walking normally as well as using her right wrist. She is right-handed.  Past Medical History  Diagnosis Date  . COPD (chronic obstructive pulmonary disease)   . Thyroid disease   . Insomnia   . GERD (gastroesophageal reflux disease)   . Arthritis   . Bipolar 1 disorder   . Personality disorder     Past Surgical History  Procedure Date  . Cesarean section   . Cholecystectomy   . Thyroidectomy   . Leep   . Incontinence surgery   . Eye surgery   . Induced abortion     Family History  Problem Relation Age of Onset  . Arthritis      family history  . Stroke      family history  . Depression      siblings  . Alcohol abuse      siblings  . Alcohol abuse      father's family    History  Substance Use Topics  . Smoking status: Current Everyday Smoker -- 1.0 packs/day for 40 years    Types: Cigarettes  . Smokeless tobacco: Not on file  . Alcohol Use: Yes     socially    OB History    Grav Para Term Preterm Abortions TAB SAB Ect Mult Living                  Review of Systems  Allergies  Penicillins and Sulfonamide derivatives  Home Medications   Current Outpatient Rx  Name Route Sig Dispense Refill  . ALBUTEROL SULFATE HFA  108 (90 BASE) MCG/ACT IN AERS Inhalation Inhale 2 puffs into the lungs every 4 (four) hours as needed.      Marland Kitchen BUTALBITAL-APAP-CAFFEINE 50-325-40 MG PO TABS Oral Take 1 tablet by mouth as needed.      Marland Kitchen CALCIUM CARBONATE 600 MG PO TABS Oral Take by mouth. Take 2 tabs in the am, and 1 tab at bedtime.     Marland Kitchen CASANTHRANOL-DOCUSATE SODIUM 30-100 MG PO CAPS Oral Take by mouth at bedtime. Take 2 pills at bedtime.     Marland Kitchen CLINDAMYCIN PHOSPHATE 1 % EX SOLN Topical Apply topically as needed.      . DESVENLAFAXINE SUCCINATE ER 100 MG PO TB24 Oral Take 100 mg by mouth daily.      . OMEGA-3 FATTY ACIDS 1000 MG PO CAPS Oral Take 1 g by mouth daily.      Marland Kitchen HYDROCODONE-ACETAMINOPHEN 10-660 MG PO TABS Oral Take 660 mg by mouth as needed.      Marland Kitchen LEVOTHYROXINE SODIUM 137 MCG PO TABS Oral Take 137 mcg by mouth 1 dose over 24 hours. 30 tablet 6  . LIDOCAINE 5 % EX PTCH  Transdermal Place 1 patch onto the skin daily. Remove & Discard patch within 12 hours or as directed by MD     . LORAZEPAM 0.5 MG PO TABS Oral Take 0.5 mg by mouth 2 (two) times daily.      Marland Kitchen LOVAZA 1 G PO CAPS  take 4 capsules by mouth once daily 120 capsule 8  . MAGNESIUM OXIDE 400 MG PO TABS Oral Take 1 tablet (400 mg total) by mouth daily. 30 tablet 1  . MELOXICAM 7.5 MG PO TABS Oral Take 1 tablet (7.5 mg total) by mouth 2 (two) times daily as needed for pain. 30 tablet 0  . MOMETASONE FURO-FORMOTEROL FUM 200-5 MCG/ACT IN AERO Inhalation Inhale 2 puffs into the lungs 2 (two) times daily.      . ANTIOXIDANT FORMULA SG PO CPCR Oral Take 1 capsule by mouth daily.      Marland Kitchen OMEPRAZOLE-SODIUM BICARBONATE 40-1100 MG PO CAPS  take 1 capsule by mouth twice a day 60 capsule 2  . OXCARBAZEPINE 300 MG PO TABS Oral Take 150 mg by mouth 2 (two) times daily.      Marland Kitchen PREDNISONE 10 MG PO TABS  8 tabs on Day 1, 6 tabs on Day 2, 4 tabs on Day 3, 2 tabs on Day 4, 1 tab on Day 5. 21 tablet 0  . PROPRANOLOL HCL 40 MG PO TABS Oral Take 1 tablet (40 mg total) by mouth 2 (two)  times daily. 60 tablet 1  . QUETIAPINE FUMARATE 400 MG PO TABS Oral Take 400 mg by mouth at bedtime.      Marland Kitchen RANITIDINE HCL 300 MG PO TABS  TAKE 1 TABLET BY MOUTH AT BEDTIME 30 tablet 3  . SENNOSIDES 8.6 MG PO TABS Oral Take by mouth daily. Take 2 tabs daily     . SIMVASTATIN 20 MG PO TABS  take 1 tablet by mouth at bedtime 30 tablet 5  . SUMATRIPTAN SUCCINATE 100 MG PO TABS Oral Take 100 mg by mouth every 2 (two) hours as needed.     . THIOTHIXENE 2 MG PO CAPS Oral Take 2 mg by mouth 3 (three) times daily.      Marland Kitchen ZIPRASIDONE HCL 60 MG PO CAPS Oral Take 60 mg by mouth 3 (three) times daily.      Marland Kitchen ZOLPIDEM TARTRATE ER 12.5 MG PO TBCR Oral Take 12.5 mg by mouth at bedtime as needed.        BP 122/83  Pulse 84  Temp(Src) 98.3 F (36.8 C) (Oral)  Resp 16  Ht 5' 3.5" (1.613 m)  Wt 193 lb (87.544 kg)  BMI 33.65 kg/m2  SpO2 96%  Physical Exam  Nursing note and vitals reviewed. Constitutional: She is oriented to person, place, and time. She appears well-developed and well-nourished.  HENT:  Head: Normocephalic and atraumatic.  Eyes: No scleral icterus.  Neck: Neck supple.  Cardiovascular: Regular rhythm and normal heart sounds.   Pulmonary/Chest: Effort normal and breath sounds normal. No respiratory distress.  Musculoskeletal:       L ankle/foot: FROM, +TTP distal lateral malleolus and ATFL.   No TTP medial malleolus, navicular, base of 5th, calcaneus, Achilles, proximal fibula.  No swelling.  No ecchymoses.  Distal neurovascular status is intact.   R wrist: Full range of motion with supination, pronation, flexion and extension. There is no snuff box tenderness. She is mild to palpation along the distal ulna. Resisted ulnar deviation reproduces the pain. Distal neurovascular status is intact.  Neurological:  She is alert and oriented to person, place, and time.  Skin: Skin is warm and dry.  Psychiatric: She has a normal mood and affect. Her speech is normal.    ED Course  Procedures  (including critical care time)  Labs Reviewed - No data to display Dg Wrist Complete Right  09/23/2011  *RADIOLOGY REPORT*  Clinical Data: Larey Seat down steps week ago with persistent pain medially  RIGHT WRIST - COMPLETE 3+ VIEW  Comparison: None.  Findings: The radiocarpal joint space appears normal.  The ulnar styloid is intact.  The carpal bones are in normal position.  IMPRESSION: Negative right wrist.  Original Report Authenticated By: Juline Patch, M.D.   Dg Ankle Complete Left  09/23/2011  *RADIOLOGY REPORT*  Clinical Data: Larey Seat down steps week ago with pain laterally  LEFT ANKLE COMPLETE - 3+ VIEW  Comparison: None.  Findings: The left ankle joint appears normal.  Alignment is normal.  No acute fracture is seen.  IMPRESSION: Negative.  Original Report Authenticated By: Juline Patch, M.D.     1. Left ankle pain   2. Right wrist pain       MDM  An x-ray was obtained today which is read by radiology as above.  Encourage rest, ice, compression with ACE bandage and/or a brace, and elevation of injured body parts.  We have placed her in a right wrist splint and given her prescription for Mobic. The x-rays are read by the radiologist as normal, however I do see a very small avulsion fracture off the distal ulna and have informed patient of this. This is likely not cause any long-standing problems and should be improved with ice and the wrist splint. If she is not improving in the next week or 2, we'll send her to sports medicine provider.   Lily Kocher, MD 09/23/11 1005

## 2011-09-23 NOTE — ED Notes (Signed)
Patient reports falling down the stairs about 10 days ago injuring her left ankle and right wrist. She has no previous injury to either extremity. Minimal swelling noted.

## 2011-09-29 ENCOUNTER — Ambulatory Visit (INDEPENDENT_AMBULATORY_CARE_PROVIDER_SITE_OTHER): Payer: BC Managed Care – PPO | Admitting: Obstetrics & Gynecology

## 2011-09-29 ENCOUNTER — Encounter: Payer: Self-pay | Admitting: Obstetrics & Gynecology

## 2011-09-29 DIAGNOSIS — Z0000 Encounter for general adult medical examination without abnormal findings: Secondary | ICD-10-CM

## 2011-09-29 DIAGNOSIS — Z113 Encounter for screening for infections with a predominantly sexual mode of transmission: Secondary | ICD-10-CM

## 2011-09-29 DIAGNOSIS — N951 Menopausal and female climacteric states: Secondary | ICD-10-CM

## 2011-09-29 DIAGNOSIS — Z1272 Encounter for screening for malignant neoplasm of vagina: Secondary | ICD-10-CM

## 2011-09-29 DIAGNOSIS — R635 Abnormal weight gain: Secondary | ICD-10-CM

## 2011-09-29 NOTE — Progress Notes (Signed)
Subjective:    Mandy Shaw is a 51 y.o. female who presents for an annual exam. The patient has several complaints today. 1) night sweats for the last several months. She has had "like clockwork" periods for all of 2012 until no period in 12/12. 2) 20 pound weight gain in last 2 months. 3) being "crabby" (She is being treated for bipolar and borderline personality disorders by Dr. Sandria Manly) The patient is sexually active. GYN screening history: last pap: was normal. The patient wears seatbelts: yes. The patient participates in regular exercise: yes( walking with her dogs). Has the patient ever been transfused or tattooed?: no. The patient reports that there is not domestic violence in her life.   Menstrual History: OB History    Grav Para Term Preterm Abortions TAB SAB Ect Mult Living   2 0  0 1 1    1       Menarche age:47 Patient's last menstrual period was 09/21/2011.    The following portions of the patient's history were reviewed and updated as appropriate: allergies, current medications, past family history, past medical history, past social history, past surgical history and problem list.  Review of Systems A comprehensive review of systems was negative. Her daughter is 58 years old, pregnant, and lives at home.She complains of dysparunia since her bladder sling 16 years ago. She is not employed.  Objective:    BP 112/76  Pulse 70  Temp(Src) 97.5 F (36.4 C) (Oral)  Resp 17  Ht 5\' 5"  (1.651 m)  Wt 194 lb (87.998 kg)  BMI 32.28 kg/m2  LMP 09/21/2011  General Appearance:    Alert, cooperative, no distress, appears stated age  Head:    Normocephalic, without obvious abnormality, atraumatic  Eyes:    PERRL, conjunctiva/corneas clear, EOM's intact, fundi    benign, both eyes  Ears:    Normal TM's and external ear canals, both ears  Nose:   Nares normal, septum midline, mucosa normal, no drainage    or sinus tenderness  Throat:   Lips, mucosa, and tongue normal; teeth and gums  normal  Neck:   Supple, symmetrical, trachea midline, no adenopathy;    thyroid:  no enlargement/tenderness/nodules; no carotid   bruit or JVD  Back:     Symmetric, no curvature, ROM normal, no CVA tenderness  Lungs:     Clear to auscultation bilaterally, respirations unlabored  Chest Wall:    No tenderness or deformity   Heart:    Regular rate and rhythm, S1 and S2 normal, no murmur, rub   or gallop  Breast Exam:    No tenderness, masses, or nipple abnormality  Abdomen:     Soft, non-tender, bowel sounds active all four quadrants,    no masses, no organomegaly  Genitalia:    Normal female without lesion, discharge or tenderness, normal estrogen effect, uterus ULN size, minimally mobile, no adnexal masses (pt. Unable to relax fully)     Extremities:   Extremities normal, atraumatic, no cyanosis or edema  Pulses:   2+ and symmetric all extremities  Skin:   Skin color, texture, turgor normal, no rashes or lesions  Lymph nodes:   Cervical, supraclavicular, and axillary nodes normal  Neurologic:   CNII-XII intact, normal strength, sensation and reflexes    throughout  .    Assessment:    Healthy female exam.  Perimenopausal vasomotor symptoms- I have recommended lifestyle changes. Weight gain- I will check a TSH, but if this is normal, I will defer to  further evaluation by her FP "Crabbiness"- I think this symptom would be better discussed with her psychiatrist. Plan:     Mammogram. Pap smear.

## 2011-09-30 LAB — TSH: TSH: 0.91 u[IU]/mL (ref 0.350–4.500)

## 2011-10-05 ENCOUNTER — Ambulatory Visit: Payer: BC Managed Care – PPO

## 2011-10-26 ENCOUNTER — Ambulatory Visit
Admission: RE | Admit: 2011-10-26 | Discharge: 2011-10-26 | Disposition: A | Discharge status: Home / Self Care | Payer: BC Managed Care – PPO | Source: Ambulatory Visit | Attending: Obstetrics & Gynecology | Admitting: Obstetrics & Gynecology

## 2011-10-26 DIAGNOSIS — Z0000 Encounter for general adult medical examination without abnormal findings: Secondary | ICD-10-CM

## 2011-10-29 ENCOUNTER — Other Ambulatory Visit: Payer: Self-pay | Admitting: Family Medicine

## 2011-11-09 ENCOUNTER — Encounter: Payer: Self-pay | Admitting: Obstetrics & Gynecology

## 2011-11-09 ENCOUNTER — Ambulatory Visit (INDEPENDENT_AMBULATORY_CARE_PROVIDER_SITE_OTHER): Payer: BC Managed Care – PPO | Admitting: Obstetrics & Gynecology

## 2011-11-09 VITALS — BP 124/82 | HR 83 | Temp 97.0°F | Ht 65.0 in | Wt 197.1 lb

## 2011-11-09 DIAGNOSIS — N9089 Other specified noninflammatory disorders of vulva and perineum: Secondary | ICD-10-CM

## 2011-11-09 NOTE — Progress Notes (Signed)
  Subjective:    Patient ID: Mandy Shaw, female    DOB: 1961/06/13, 51 y.o.   MRN: [id number]  HPI  Mandy Shaw is here because of a new onset sore lesion on her left labia majora (at the most posterior aspect). She denies having a history of such lesions.   Review of Systems  She has been married for 18 years and thinks everyone is monogamous. Mandy Shaw recently started her on Topamax. She has gained weight since her last visit here.    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 I have suggested that she see Mandy Shaw about her weight gain.

## 2011-11-10 ENCOUNTER — Telehealth: Payer: Self-pay | Admitting: *Deleted

## 2011-11-10 DIAGNOSIS — B009 Herpesviral infection, unspecified: Secondary | ICD-10-CM

## 2011-11-10 LAB — FOLLICLE STIMULATING HORMONE: FSH: 8.8 m[IU]/mL

## 2011-11-10 MED ORDER — VALACYCLOVIR HCL 500 MG PO TABS: 500.0000 mg | ORAL_TABLET | Freq: Two times a day (BID) | ORAL | Status: AC

## 2011-11-10 NOTE — Telephone Encounter (Signed)
Pt notified of positive HSV and RX for Valtrex 500 mg Disp #6 sent to [facility] in [place] per Dr. Marice Potter.  Pt has a f/u appt with Dr. Marice Potter in March.

## 2011-11-18 ENCOUNTER — Other Ambulatory Visit: Payer: Self-pay | Admitting: Family Medicine

## 2011-11-19 ENCOUNTER — Other Ambulatory Visit: Payer: Self-pay | Admitting: Family Medicine

## 2011-11-24 ENCOUNTER — Encounter: Payer: Self-pay | Admitting: Obstetrics & Gynecology

## 2011-11-24 ENCOUNTER — Telehealth: Payer: Self-pay | Admitting: *Deleted

## 2011-11-24 ENCOUNTER — Ambulatory Visit (INDEPENDENT_AMBULATORY_CARE_PROVIDER_SITE_OTHER): Payer: BC Managed Care – PPO | Admitting: Obstetrics & Gynecology

## 2011-11-24 VITALS — BP 115/74 | HR 80 | Temp 96.5°F | Resp 16 | Ht 66.0 in | Wt 194.0 lb

## 2011-11-24 DIAGNOSIS — A630 Anogenital (venereal) warts: Secondary | ICD-10-CM

## 2011-11-24 DIAGNOSIS — A6000 Herpesviral infection of urogenital system, unspecified: Secondary | ICD-10-CM

## 2011-11-24 MED ORDER — ACYCLOVIR 400 MG PO TABS: 400.0000 mg | ORAL_TABLET | Freq: Three times a day (TID) | ORAL | Status: AC

## 2011-11-24 NOTE — Progress Notes (Signed)
  Subjective:    Patient ID: Mandy Shaw, female    DOB: [birth year], 51 y.o.   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 The valtrex worked well and that lesion has resolved. However, a new small "BB size" thing has shown up on her left vulva recently and she would like it checked.  Review of Systems   She has already had her flu shot. Objective:   Physical Exam  Small (5 mm) wart on lower aspect of left labia. I treated it with TCA.      Assessment & Plan:  HPV- treated with TCA. She will come back in a month if it doesn't resolve by then.

## 2011-11-24 NOTE — Telephone Encounter (Signed)
Pharmacy called about a RX that was to be given to pt as she left office today for Acyclovir 400mg  Disp 15 to take po TID x 5 days when outbreak symptoms begin.  This was sent to [facility] in [place] per Dr Marice Potter.Marland Kitchen

## 2011-12-01 ENCOUNTER — Telehealth: Payer: Self-pay | Admitting: Family Medicine

## 2011-12-01 NOTE — Telephone Encounter (Signed)
No answer no vm; will mail letter

## 2011-12-09 ENCOUNTER — Ambulatory Visit: Payer: BC Managed Care – PPO | Admitting: Family Medicine

## 2011-12-10 ENCOUNTER — Telehealth: Payer: Self-pay | Admitting: Family Medicine

## 2011-12-10 ENCOUNTER — Encounter: Payer: Self-pay | Admitting: Family Medicine

## 2011-12-10 ENCOUNTER — Ambulatory Visit (INDEPENDENT_AMBULATORY_CARE_PROVIDER_SITE_OTHER): Payer: BC Managed Care – PPO | Admitting: Family Medicine

## 2011-12-10 VITALS — BP 118/75 | HR 80 | Ht 65.0 in | Wt 195.0 lb

## 2011-12-10 DIAGNOSIS — IMO0001 Reserved for inherently not codable concepts without codable children: Secondary | ICD-10-CM

## 2011-12-10 DIAGNOSIS — H9209 Otalgia, unspecified ear: Secondary | ICD-10-CM

## 2011-12-10 DIAGNOSIS — Z23 Encounter for immunization: Secondary | ICD-10-CM

## 2011-12-10 DIAGNOSIS — M2550 Pain in unspecified joint: Secondary | ICD-10-CM

## 2011-12-10 DIAGNOSIS — R5383 Other fatigue: Secondary | ICD-10-CM

## 2011-12-10 DIAGNOSIS — M791 Myalgia, unspecified site: Secondary | ICD-10-CM

## 2011-12-10 DIAGNOSIS — J449 Chronic obstructive pulmonary disease, unspecified: Secondary | ICD-10-CM

## 2011-12-10 DIAGNOSIS — E785 Hyperlipidemia, unspecified: Secondary | ICD-10-CM

## 2011-12-10 LAB — CORTISOL: Cortisol, Plasma: 6.1 ug/dL

## 2011-12-10 LAB — COMPLETE METABOLIC PANEL WITH GFR
ALT: 16 U/L (ref 0–35)
AST: 19 U/L (ref 0–37)
Albumin: 4.7 g/dL (ref 3.5–5.2)
BUN: 22 mg/dL (ref 6–23)
CO2: 24 mEq/L (ref 19–32)
Calcium: 9.9 mg/dL (ref 8.4–10.5)
Chloride: 113 mEq/L — ABNORMAL HIGH (ref 96–112)
Potassium: 4.8 mEq/L (ref 3.5–5.3)

## 2011-12-10 LAB — CBC
HCT: 49.3 % — ABNORMAL HIGH (ref 36.0–46.0)
Hemoglobin: 16.4 g/dL — ABNORMAL HIGH (ref 12.0–15.0)
WBC: 9.7 10*3/uL (ref 4.0–10.5)

## 2011-12-10 LAB — TSH: TSH: 1.006 u[IU]/mL (ref 0.350–4.500)

## 2011-12-10 LAB — IRON: Iron: 76 ug/dL (ref 42–145)

## 2011-12-10 LAB — LIPID PANEL
Cholesterol: 163 mg/dL (ref 0–200)
VLDL: 17 mg/dL (ref 0–40)

## 2011-12-10 MED ORDER — ANTIPYRINE-BENZOCAINE 5.4-1.4 % OT SOLN: 3.0000 [drp] | OTIC | Status: DC | PRN

## 2011-12-10 MED ORDER — ALBUTEROL SULFATE HFA 108 (90 BASE) MCG/ACT IN AERS: 2.0000 | INHALATION_SPRAY | Freq: Four times a day (QID) | RESPIRATORY_TRACT | Status: DC | PRN

## 2011-12-10 NOTE — Progress Notes (Signed)
Subjective:    Patient ID: Mandy Shaw, female    DOB: 1961-06-06, 51 y.o.   MRN: [id number]  HPI Has been seeing Dr. Oneal Grout for her HA (pain management) for several years. Has had some major weight gain. She did see Dr. Marice Potter who says it is not menopause. They feel it is her psych meds.  Then saw Dr. Orie Rout for her HA (neurologist).  Now seeing Dr. Anne Hahn now (dr. Orie Rout left the practice).  Now getting botox for her HA and it has helped significantly.  Now realizing how much her fingers, joints, knee sand her feet hurt.  When went off the hydrocodone her pain inc dramatically.  Says he skin hurst. Painful to get a hug.  Dr. Oneal Grout feels it is fibromyalgia too but not formally diagnoses.  Fatigue or low energy.  Sleep quality is poor. Even with sleep aid.   Waterin her ear in November, and ever since then she's had discomfort. She denies any actual drainage from the ear. Has tried alcohol and some OTC tx, but has not gotten relief. Has been painful and feels deep. Says feel air in there sometimes.  No fever.  No other cold symptoms.   COPD is worse. Says wonders if needs inhaler daily.  Says feels SOB every day. Says has been trying to walk for exercise.  She says that she use an inhaler was when she had bronchitis. She has also been gained weight from her psychiatric medications that could be contributing to her shortness of breath, unrelated to her COPD.  Review of Systems     Objective:   Physical Exam  Constitutional: She is oriented to person, place, and time. She appears well-developed and well-nourished.  HENT:  Head: Normocephalic and atraumatic.  Left Ear: External ear normal.  Cardiovascular: Normal rate, regular rhythm and normal heart sounds.   Pulmonary/Chest: Effort normal and breath sounds normal.  Musculoskeletal:       UE nad LE strength 5/5 bilaterally.  She is bilaterally on the upper shoulders, mid back, low back, upper chest, upper arms, forearms, lower thighs, calves.  She was nontender on her upper thighs.  Neurological: She is alert and oriented to person, place, and time. No cranial nerve deficit.  Skin: Skin is warm and dry.       Reflexes 1+ in the upper extremities and lower extremities, symmetric.  Psychiatric: She has a normal mood and affect. Her behavior is normal.          Assessment & Plan:  Diffuse myalgias-based on her exam today and her history I suspect that she has fibromyalgia. She has fatigue, diffuse pain, stiffness, sore skin, poor sleep, all of which have been going on for months.  We'll do some blood work today to rule out other causes. If negative the she has enough criteria with her point tenderness in the upper and lower half of the body that is symmetric on the right and left sides that she would meet the diagnostic criteria for fibromyalgia.  COPD-encouraged her to make an appointment next week or so telemetry so that we can better stage her COPD. Especially since she feels she's been more short of breath recently. She has not had a cold and has no wheezing on exam today. I will send her a prescription for rescue inhaler to her pharmacy.  She has also been gained weight from her psychiatric medications that could be contributing to her shortness of breath, unrelated to her COPD.  Otalgia, on  the left-most likely eustachian tube dysfunction. Her ear exam is completely normal today. Tympanometry was normal and the ears. Will give her reassurance. He can also call her for some drops for pain.  She needs to avoid putting anything else in the ear or using Q-tips.  Pneumonia and tetanus vaccines were updated today.

## 2011-12-10 NOTE — Telephone Encounter (Signed)
Pt.notified

## 2011-12-10 NOTE — Telephone Encounter (Signed)
Called patient: Her tympanometry was normal. This is reassuring that the pressure behind her eardrum is normal. I will send her prescription for pain relieving drops for ear. I will also send her prescription for albuterol inhaler for her COPD. If her ear gets worse then please call and make an appointment. Avoid putting things in the ear including Q-tips.

## 2011-12-10 NOTE — Patient Instructions (Signed)
We will call you with your lab results. If you don't here from [place] in about a week then please give [place] a call at [phone]. Fibromyalgia Fibromyalgia is a disorder that is often misunderstood. It is associated with muscular pains and tenderness that comes and goes. It is often associated with fatigue and sleep disturbances. Though it tends to be long-lasting, fibromyalgia is not life-threatening. CAUSES   The exact cause of fibromyalgia is unknown. People with certain gene types are predisposed to developing fibromyalgia and other conditions. Certain factors can play a role as triggers, such as:  Spine disorders.   Arthritis.   Severe injury (trauma) and other physical stressors.   Emotional stressors.  SYMPTOMS    The main symptom is pain and stiffness in the muscles and joints, which can vary over time.   Sleep and fatigue problems.  Other related symptoms may include:  Bowel and bladder problems.   Headaches.   Visual problems.   Problems with odors and noises.   Depression or mood changes.   Painful periods (dysmenorrhea).   Dryness of the skin or eyes.  DIAGNOSIS   There are no specific tests for diagnosing fibromyalgia. Patients can be diagnosed accurately from the specific symptoms they have. The diagnosis is made by determining that nothing else is causing the problems. TREATMENT   There is no cure. Management includes medicines and an active, healthy lifestyle. The goal is to enhance physical fitness, decrease pain, and improve sleep. HOME CARE INSTRUCTIONS    Only take over-the-counter or prescription medicines as directed by your caregiver. Sleeping pills, tranquilizers, and pain medicines may make your problems worse.   Low-impact aerobic exercise is very important and advised for treatment. At first, it may seem to make pain worse. Gradually increasing your tolerance will overcome this feeling.   Learning relaxation techniques and how to control stress will help  you. Biofeedback, visual imagery, hypnosis, muscle relaxation, yoga, and meditation are all options.   Anti-inflammatory medicines and physical therapy may provide short-term help.   Acupuncture or massage treatments may help.   Take muscle relaxant medicines as suggested by your caregiver.   Avoid stressful situations.   Plan a healthy lifestyle. This includes your diet, sleep, rest, exercise, and friends.   Find and practice a hobby you enjoy.   Join a fibromyalgia support group for interaction, ideas, and sharing advice. This may be helpful.  SEEK MEDICAL CARE IF:   You are not having good results or improvement from your treatment. FOR MORE INFORMATION   National Fibromyalgia Association: www.fmaware.org Arthritis Foundation: www.arthritis.org Document Released: 09/06/2005 Document Revised: 08/26/2011 Document Reviewed: 12/17/2009 [facility] Patient Information 2012 [place], [place].

## 2011-12-11 LAB — VITAMIN D 25 HYDROXY (VIT D DEFICIENCY, FRACTURES): Vit D, 25-Hydroxy: 48 ng/mL (ref 30–89)

## 2011-12-11 LAB — HEMOGLOBIN A1C: Hgb A1c MFr Bld: 5.1 % (ref <5.7)

## 2011-12-11 LAB — MAGNESIUM: Magnesium: 2.1 mg/dL (ref 1.5–2.5)

## 2011-12-11 LAB — SEDIMENTATION RATE: Sed Rate: 4 mm/hr (ref 0–22)

## 2011-12-13 ENCOUNTER — Encounter: Payer: Self-pay | Admitting: Family Medicine

## 2011-12-13 ENCOUNTER — Ambulatory Visit (INDEPENDENT_AMBULATORY_CARE_PROVIDER_SITE_OTHER): Payer: BC Managed Care – PPO | Admitting: Family Medicine

## 2011-12-13 VITALS — BP 112/70 | HR 82 | Ht 65.0 in | Wt 194.0 lb

## 2011-12-13 DIAGNOSIS — T7840XA Allergy, unspecified, initial encounter: Secondary | ICD-10-CM

## 2011-12-13 DIAGNOSIS — J449 Chronic obstructive pulmonary disease, unspecified: Secondary | ICD-10-CM

## 2011-12-13 DIAGNOSIS — Z888 Allergy status to other drugs, medicaments and biological substances status: Secondary | ICD-10-CM

## 2011-12-13 DIAGNOSIS — R0602 Shortness of breath: Secondary | ICD-10-CM

## 2011-12-13 MED ORDER — ALBUTEROL SULFATE (2.5 MG/3ML) 0.083% IN NEBU
2.5000 mg | INHALATION_SOLUTION | Freq: Four times a day (QID) | RESPIRATORY_TRACT | Status: DC | PRN
  Administered 2011-12-13: 2.5 mg via RESPIRATORY_TRACT

## 2011-12-13 MED ORDER — METHYLPREDNISOLONE ACETATE 80 MG/ML IJ SUSP
80.0000 mg | Freq: Once | INTRAMUSCULAR | Status: AC
  Administered 2011-12-13: 80 mg via INTRAMUSCULAR

## 2011-12-13 NOTE — Progress Notes (Signed)
Addended by: Wyline Beady on: 12/13/2011 02:25 PM   Modules accepted: Orders

## 2011-12-13 NOTE — Patient Instructions (Signed)
Smoking Cessation, Tips for Success     YOU CAN QUIT SMOKING   If you are ready to quit smoking, congratulations! You have chosen to help yourself be healthier. Cigarettes bring nicotine, tar, carbon monoxide, and other irritants into your body. Your lungs, heart, and blood vessels will be able to work better without these poisons. There are many different ways to quit smoking. Nicotine gum, nicotine patches, a nicotine inhaler, or nicotine nasal spray can help with physical craving. Hypnosis, support groups, and medicines help break the habit of smoking. Here are some tips to help you quit for good.     . Throw away all cigarettes.   . Clean and remove all ashtrays from your home, work, and car.   . On a card, write down your reasons for quitting. Carry the card with you and read it when you get the urge to smoke.   . Cleanse your body of nicotine. Drink enough water and fluids to keep your urine clear or pale yellow. Do this after quitting to flush the nicotine from your body.   . Learn to predict your moods. Do not let a bad situation be your excuse to have a cigarette. Some situations in your life might tempt you into wanting a cigarette.   . Never have "just one" cigarette. It leads to wanting another and another. Remind yourself of your decision to quit.   . Change habits associated with smoking. If you smoked while driving or when feeling stressed, try other activities to replace smoking. Stand up when drinking your coffee. Brush your teeth after eating. Sit in a different chair when you read the paper. Avoid alcohol while trying to quit, and try to drink fewer caffeinated beverages. Alcohol and caffeine may urge you to smoke.   . Avoid foods and drinks that can trigger a desire to smoke, such as sugary or spicy foods and alcohol.   . Ask people who smoke not to smoke around you.   . Have something planned to do right after eating or having a cup of coffee. Take a walk or exercise to perk you up. This will  help to keep you from overeating.   . Try a relaxation exercise to calm you down and decrease your stress. Remember, you may be tense and nervous for the first 2 weeks after you quit, but this will pass.   . Find new activities to keep your hands busy. Play with a pen, coin, or rubber band. Doodle or draw things on paper.   . Brush your teeth right after eating. This will help cut down on the craving for the taste of tobacco after meals. You can try mouthwash, too.   . Use oral substitutes, such as lemon drops, carrots, a cinnamon stick, or chewing gum, in place of cigarettes. Keep them handy so they are available when you have the urge to smoke.   . When you have the urge to smoke, try deep breathing.   . Designate your home as a nonsmoking area.   . If you are a heavy smoker, ask your caregiver about a prescription for nicotine chewing gum. It can ease your withdrawal from nicotine.   . Reward yourself. Set aside the cigarette money you save and buy yourself something nice.   . Look for support from others. Join a support group or smoking cessation program. Ask someone at home or at work to help you with your plan to quit smoking.   . Always ask   yourself, "Do I need this cigarette or is this just a reflex?" Tell yourself, "Today, I choose not to smoke," or "I do not want to smoke." You are reminding yourself of your decision to quit, even if you do smoke a cigarette.    HOW WILL I FEEL WHEN I QUIT SMOKING?     . The benefits of not smoking start within days of quitting.   . You may have symptoms of withdrawal because your body is used to nicotine (the addictive substance in cigarettes). You may crave cigarettes, be irritable, feel very hungry, cough often, get headaches, or have difficulty concentrating.   . The withdrawal symptoms are only temporary. They are strongest when you first quit but will go away within 10 to 14 days.   . When withdrawal symptoms occur, stay in control. Think about your reasons for  quitting. Remind yourself that these are signs that your body is healing and getting used to being without cigarettes.   . Remember that withdrawal symptoms are easier to treat than the major diseases that smoking can cause.   . Even after the withdrawal is over, expect periodic urges to smoke. However, these cravings are generally short-lived and will go away whether you smoke or not. Do not smoke!   . If you relapse and smoke again, do not lose hope. Most smokers quit 3 times before they are successful.   . If you relapse, do not give up! Plan ahead and think about what you will do the next time you get the urge to smoke.    LIFE AS A NONSMOKER: MAKE IT FOR A MONTH, MAKE IT FOR LIFE     Day 1: Hang this page where you will see it every day.   Day 2: Get rid of all ashtrays, matches, and lighters.   Day 3: Drink water. Breathe deeply between sips.   Day 4: Avoid places with smoke-filled air, such as bars, clubs, or the smoking section of restaurants.   Day 5: Keep track of how much money you save by not smoking.   Day 6: Avoid boredom. Keep a good book with you or go to the movies.   Day 7: Reward yourself! One week without smoking!   Day 8: Make a dental appointment to get your teeth cleaned.   Day 9: Decide how you will turn down a cigarette before it is offered to you.   Day 10: Review your reasons for quitting.   Day 11: Distract yourself. Stay active to keep your mind off smoking and to relieve tension. Take a walk, exercise, read a book, do a crossword puzzle, or try a new hobby.   Day 12: Exercise. Get off the bus before your stop or use stairs instead of escalators.   Day 13: Call on friends for support and encouragement.   Day 14: Reward yourself! Two weeks without smoking!   Day 15: Practice deep breathing exercises.   Day 16: Bet a friend that you can stay a nonsmoker.   Day 17: Ask to sit in nonsmoking sections of restaurants.   Day 18: Hang up "No Smoking" signs.   Day 19: Think of yourself as a  nonsmoker.   Day 20: Each morning, tell yourself you will not smoke.   Day 21: Reward yourself! Three weeks without smoking!   Day 22: Think of smoking in negative ways. Remember how it stains your teeth, gives you bad breath, and leaves you short of breath.   Day   23: Eat a nutritious breakfast.   Day 24:Do not relive your days as a smoker.   Day 25: Hold a pencil in your hand when talking on the telephone.   Day 26: Tell all your friends you do not smoke.   Day 27: Think about how much better food tastes.   Day 28: Remember, one cigarette is one too many.   Day 29: Take up a hobby that will keep your hands busy.   Day 30: Congratulations! One month without smoking! Give yourself a big reward.     Your caregiver can direct you to community resources or hospitals for support, which may include:   . Group support.   . Education.   . Hypnosis.   . Subliminal therapy.      Document Released: 06/04/2004 Document Revised: 08/26/2011 Document Reviewed: 06/23/2009   [employer] Patient Information 2012 [employer]

## 2011-12-13 NOTE — Progress Notes (Signed)
Subjective:    Patient ID: Mandy Shaw, female    DOB: October 12, 1960, 51 y.o.   MRN: [id number]  HPI Here PFTs.  She's been complaining of shortness of breath for several months. She says it seems to be worse with activity. She says she feels like it hurt to take a deep breath and has no difficulty breathing L. Note she has had a significant weight change in the last several months with her psychiatric medications. Smokes 1/2 ppd for 20 years.     Review of Systems BP 112/70  Pulse 82  Ht 5\' 5"  (1.651 m)  Wt 194 lb (87.998 kg)  BMI 32.28 kg/m2    Allergies  Allergen Reactions  . Albuterol Anaphylaxis  . Penicillins     REACTION: Anaphalatic  . Sulfonamide Derivatives     REACTION: Anaphalatic    Past Medical History  Diagnosis Date  . COPD (chronic obstructive pulmonary disease)   . Thyroid disease   . Insomnia   . GERD (gastroesophageal reflux disease)   . Arthritis   . Bipolar 1 disorder   . Personality disorder     Past Surgical History  Procedure Date  . Cesarean section   . Cholecystectomy   . Thyroidectomy   . Leep   . Incontinence surgery   . Eye surgery   . Induced abortion   . Cardiac surgery     flap oh heart removed  . Laparotomy     exploratory for pain  . Condyloma excision/fulguration     History   Social History  . Marital Status: Married    Spouse Name: N/A    Number of Children: N/A  . Years of Education: N/A   Occupational History  . Not on file.   Social History Main Topics  . Smoking status: Current Everyday Smoker -- 0.5 packs/day for 40 years    Types: Cigarettes  . Smokeless tobacco: Not on file  . Alcohol Use: Yes     socially  . Drug Use: No  . Sexually Active: Yes -- Female partner(s)     married, 1 daughter, 1  1/2 yrs college, self-employed, no reg exercise, 3 caffeine drinks daily.   Other Topics Concern  . Not on file   Social History Narrative  . No narrative on file    Family History  Problem Relation Age of  Onset  . Arthritis      family history  . Stroke      family history  . Depression      siblings  . Alcohol abuse      siblings  . Alcohol abuse      father's family        Objective:   Physical Exam  Constitutional: She is oriented to person, place, and time. She appears well-developed and well-nourished.  HENT:  Head: Normocephalic and atraumatic.  Cardiovascular: Normal rate, regular rhythm and normal heart sounds.   Pulmonary/Chest: Effort normal and breath sounds normal.  Neurological: She is alert and oriented to person, place, and time.  Skin: Skin is warm and dry.  Psychiatric: She has a normal mood and affect. Her behavior is normal.   After the NEB treatment she started coughing and says her tongue felt like it was swelling.  She says she felt her feet itching. This started within a couple minutes after getting a nebulizer treatment. She says she has never used albuterol before. Even though it is on her med list so I'm not sure  that that is actually true.       Assessment & Plan:  SOB- her spirometry was actually normal which is surprising because of her heavy smoking history. Her FVC was 94%, FEV1 89% and her ratio 78%. She had no significant improvement in FVC or FEV1 after albuterol treatment. We did still discuss the importance of smoking cessation to reduce her overall risk of developing COPD. We also performed an EKG today. She has not been having any chest pain just the shortness of breath. EKG- NSR of 79 bpm, normal axis. Possible old Q waves in the inferior leads.  Normla intervalss.  No ST-T wave changes. No acute ischemia.  At this point, explained to her that I think that her shortness of breath is primarily related to her recent weight gain. We discussed working on exercise in slow increments. If she gets short of breath with adnexal activity and starting with 3-4 minutes. She is also working with her psychiatrist to make some major changes to her psychiatric  medication regimen. They have recently on Topamax which should also help. We also discussed making sure she is watching her caloric intake.  Fibromyalgia-based on her clinical exam and history is most likely has fibromyalgia. We recently did lab work was all normal. I think should be great candidate for one of the medications for fibromyalgia. They'll need to discuss this with her psychiatrist to make sure they're safe with her current regimen. Not sure that we have the most updated medication list.  Possible allergic reaction-she did start coughing, feels short of breath and felt her tongue was swelling and her feet were itching after receiving albuterol neb. At times very curious and she has had prescriptions for inhalers in the past including albuterol and Dulera. Obviously I am not convinced that she has a true albuterol allergy. He did give her 80 mg of Depo-Medrol, and 25 mg of Benadryl while she was here. We also put her on 1 L of oxygen for about an hour and make sure that she was stable before she left. She had no wheezing on exam and she had no evidence of tongue or throat swelling on exam.

## 2011-12-14 ENCOUNTER — Encounter: Payer: Self-pay | Admitting: *Deleted

## 2011-12-16 ENCOUNTER — Other Ambulatory Visit: Payer: Self-pay | Admitting: Family Medicine

## 2011-12-21 ENCOUNTER — Telehealth: Payer: Self-pay | Admitting: *Deleted

## 2011-12-21 NOTE — Telephone Encounter (Signed)
Called [phone]

## 2011-12-21 NOTE — Telephone Encounter (Signed)
Can you call her psych and leave a message to see if He feels it is ok for her to try savella.

## 2011-12-21 NOTE — Telephone Encounter (Signed)
LM  On VM at Dr. Julien Girt office (pt's psych MD) for them to return call in regards to medication.

## 2011-12-21 NOTE — Telephone Encounter (Signed)
Pt states she is f/u on what medication you want her on for her fibromyalgia. Pt states she didn't know if you want her to refill her hydrocodone or if you decided what med you wanted her on. Please advise.

## 2011-12-22 ENCOUNTER — Encounter: Payer: Self-pay | Admitting: Family Medicine

## 2011-12-22 MED ORDER — MILNACIPRAN HCL 12.5 & 25 & 50 MG PO MISC: ORAL | Status: DC

## 2011-12-22 NOTE — Telephone Encounter (Signed)
Needs to decrease her dose but doesn't have to stop completely.  Can cut in half. Can cause some nausea and lightheadedness the first week or two.

## 2011-12-22 NOTE — Telephone Encounter (Signed)
Pt informed

## 2011-12-22 NOTE — Telephone Encounter (Signed)
Call pt and see if she is ok with starting savella. If ok I will send rx.

## 2011-12-22 NOTE — Telephone Encounter (Signed)
Spoke with Corrie Dandy at Dr. Imagene Gurney office. She will confirm with him that it is ok for pt to take [facility] and call me back.

## 2011-12-22 NOTE — Telephone Encounter (Signed)
Mandy Shaw from Dr. Imagene Gurney office called and states that he says it is ok for the pt to take the [facility].

## 2011-12-22 NOTE — Telephone Encounter (Signed)
Pt is asking if she should stop taking the hydrocodone and muscle relaxer when she starts this med. She would also like to know the side effects of this. Please advise.

## 2011-12-27 ENCOUNTER — Other Ambulatory Visit: Payer: Self-pay | Admitting: Family Medicine

## 2011-12-27 ENCOUNTER — Telehealth: Payer: Self-pay | Admitting: *Deleted

## 2011-12-27 NOTE — Telephone Encounter (Signed)
Pt informed

## 2011-12-27 NOTE — Telephone Encounter (Signed)
Make sure taking with foot. Stay at the low dose then until her body gets used to it.

## 2011-12-27 NOTE — Telephone Encounter (Signed)
Pt states that she started taking the Savella. She started the first of 4 days of the 25mg . Pt states she takes her meds at 4am. States she got up at [facility] and felt dizzy and vomited. States she called the pharmacy and they told her that it was her body saying this is enough. States she only took 12.5 last night and this am. States she doesn't eat prior to taking her meds. Please advise.

## 2011-12-28 ENCOUNTER — Encounter: Payer: Self-pay | Admitting: Family Medicine

## 2012-01-10 ENCOUNTER — Encounter: Payer: Self-pay | Admitting: Family Medicine

## 2012-01-10 ENCOUNTER — Ambulatory Visit (INDEPENDENT_AMBULATORY_CARE_PROVIDER_SITE_OTHER): Payer: BC Managed Care – PPO | Admitting: Family Medicine

## 2012-01-10 VITALS — BP 126/86 | HR 111 | Ht 65.0 in | Wt 189.0 lb

## 2012-01-10 DIAGNOSIS — J3489 Other specified disorders of nose and nasal sinuses: Secondary | ICD-10-CM

## 2012-01-10 MED ORDER — MUPIROCIN 2 % EX OINT: TOPICAL_OINTMENT | Freq: Three times a day (TID) | CUTANEOUS | Status: AC

## 2012-01-10 MED ORDER — ACYCLOVIR 5 % EX OINT: TOPICAL_OINTMENT | Freq: Three times a day (TID) | CUTANEOUS | Status: DC

## 2012-01-10 NOTE — Progress Notes (Signed)
  Subjective:    Patient ID: Mandy Shaw, female    DOB: 07/13/1961, 51 y.o.   MRN: [id number]  HPI Sore in her nose on adn off in her nose for 2 years. Always on the inside of her nose.  Will last for weeks and then will finally go away.  She feels is slowly coming on the outside of her nose. Has an area that has been there for 3 weeks currently. Has been using neosporin.  +runny nose. Feels sore to touch. No fever. + hx of genitla herpes. She is worried about cancer.    Review of Systems     Objective:   Physical Exam  Constitutional: She is oriented to person, place, and time. She appears well-developed and well-nourished.  HENT:  Head: Normocephalic and atraumatic.       She has raw ulceration in the left nostril along the septum that extends to the external nose. No active drianage or oozing. No rhinorrhea on exam.   Cardiovascular: Normal rate, regular rhythm and normal heart sounds.   Pulmonary/Chest: Effort normal and breath sounds normal.  Neurological: She is alert and oriented to person, place, and time.  Skin: Skin is warm and dry.  Psychiatric: She has a normal mood and affect. Her behavior is normal.          Assessment & Plan:  Sores in nose likely either bacterial infection thus we'll cover with Bactroban ointment versus a cold sore. She's had a lesion too long to do a viral culture check for herpes simplex she does have a history of genital herpes. I will treat her with acyclovir ointment as well. If not significantly improved in the next 2 weeks and please give [place] a call. I did refill some medication in case it works. Avoid using the Neosporin as prolonged use can cause a contact dermatitis.

## 2012-01-10 NOTE — Progress Notes (Signed)
Addended by: Wyline Beady on: 01/10/2012 12:07 PM   Modules accepted: Orders

## 2012-01-13 LAB — WOUND CULTURE: Gram Stain: NONE SEEN

## 2012-01-21 ENCOUNTER — Other Ambulatory Visit: Payer: Self-pay | Admitting: Family Medicine

## 2012-02-15 ENCOUNTER — Other Ambulatory Visit: Payer: Self-pay | Admitting: Family Medicine

## 2012-02-22 ENCOUNTER — Other Ambulatory Visit: Payer: Self-pay | Admitting: Family Medicine

## 2012-03-17 ENCOUNTER — Other Ambulatory Visit: Payer: Self-pay | Admitting: Family Medicine

## 2012-04-01 ENCOUNTER — Emergency Department
Admission: EM | Admit: 2012-04-01 | Discharge: 2012-04-01 | Disposition: A | Discharge status: Home / Self Care | Payer: BC Managed Care – PPO | Source: Home / Self Care

## 2012-04-01 NOTE — ED Notes (Signed)
Woke up yesterday morning with right jaw pain and swelling, hx of TMJ

## 2012-04-04 ENCOUNTER — Ambulatory Visit (INDEPENDENT_AMBULATORY_CARE_PROVIDER_SITE_OTHER): Payer: BC Managed Care – PPO | Admitting: Family Medicine

## 2012-04-04 ENCOUNTER — Encounter: Payer: Self-pay | Admitting: Family Medicine

## 2012-04-04 VITALS — BP 100/63 | HR 98 | Ht 65.0 in | Wt 189.0 lb

## 2012-04-04 DIAGNOSIS — R635 Abnormal weight gain: Secondary | ICD-10-CM

## 2012-04-04 DIAGNOSIS — L0201 Cutaneous abscess of face: Secondary | ICD-10-CM

## 2012-04-04 DIAGNOSIS — L03211 Cellulitis of face: Secondary | ICD-10-CM

## 2012-04-04 DIAGNOSIS — N289 Disorder of kidney and ureter, unspecified: Secondary | ICD-10-CM

## 2012-04-04 DIAGNOSIS — H538 Other visual disturbances: Secondary | ICD-10-CM

## 2012-04-04 NOTE — Patient Instructions (Signed)
We will call you with your lab results. If you don't here from us in about a week then please give us a call at [phone].  

## 2012-04-04 NOTE — Progress Notes (Signed)
Subjective:    Patient ID: Mandy Shaw, female    DOB: 11-28-1960, 51 y.o.   MRN: [id number]  HPI She was seen at did not help [facility] for facial cellulitis on 04/01/2012. She was discharged home on clindamycin. She did have some blood work showing an elevated creatinine as well as an elevated white blood count. She was also given instructions to use warm compresses. She says her dog licked her face and it felt tender. Says doesn's remember any trauma or rash.  NExt day was very swollen and hard to open her mouth and went to the ED.  She went to UC first and then referred to Hospital.  Tolerating clindamycin well without any side effects. She did bring in notes from the emergency department.   Started on gabapentin 3 weeks ago. Since then has had vision change in both eyes and like her weight is going up even that has hardly ben able to eat with the jaw swelling. Says bloating and swollen in her hands and ankles. Hard to get her rings on and off.   Review of Systems BP 100/63  Pulse 98  Ht 5\' 5"  (1.651 m)  Wt 189 lb (85.73 kg)  BMI 31.45 kg/m2  LMP 03/15/2012    Allergies  Allergen Reactions  . Albuterol Anaphylaxis  . Penicillins     REACTION: Anaphalatic  . Sulfonamide Derivatives     REACTION: Anaphalatic    Past Medical History  Diagnosis Date  . COPD (chronic obstructive pulmonary disease)   . Thyroid disease   . Insomnia   . GERD (gastroesophageal reflux disease)   . Arthritis   . Bipolar 1 disorder   . Personality disorder     Past Surgical History  Procedure Date  . Cesarean section   . Cholecystectomy   . Thyroidectomy   . Leep   . Incontinence surgery   . Eye surgery   . Induced abortion   . Cardiac surgery     flap oh heart removed  . Laparotomy     exploratory for pain  . Condyloma excision/fulguration     History   Social History  . Marital Status: Married    Spouse Name: N/A    Number of Children: N/A  . Years of  Education: N/A   Occupational History  . Not on file.   Social History Main Topics  . Smoking status: Current Everyday Smoker -- 0.5 packs/day for 40 years    Types: Cigarettes  . Smokeless tobacco: Not on file  . Alcohol Use: Yes     socially  . Drug Use: No  . Sexually Active: Yes -- Female partner(s)     married, 1 daughter, 1  1/2 yrs college, self-employed, no reg exercise, 3 caffeine drinks daily.   Other Topics Concern  . Not on file   Social History Narrative  . No narrative on file    Family History  Problem Relation Age of Onset  . Arthritis      family history  . Stroke      family history  . Depression      siblings  . Alcohol abuse      siblings  . Alcohol abuse      father's family    Outpatient Encounter Prescriptions as of 04/04/2012  Medication Sig Dispense Refill  . acyclovir ointment (ZOVIRAX) 5 % Apply topically 3 (three) times daily.  15 g  2  . albuterol (PROVENTIL HFA;VENTOLIN HFA) 108 (90  BASE) MCG/ACT inhaler Inhale 2 puffs into the lungs every 6 (six) hours as needed for wheezing or shortness of breath.  1 Inhaler  0  . Calcium Carbonate (CALCIUM 500 PO) Take by mouth 2 (two) times daily at 10 AM and 5 PM.      . desvenlafaxine (PRISTIQ) 50 MG 24 hr tablet Take 50 mg by mouth daily.      Marland Kitchen eletriptan (RELPAX) 40 MG tablet One tablet by mouth at onset of headache. May repeat in 2 hours if headache persists or recurs. may repeat in 2 hours if necessary      . levothyroxine (SYNTHROID, LEVOTHROID) 137 MCG tablet take 1 tablet by mouth every 24 hours  30 tablet  3  . LORazepam (ATIVAN) 0.5 MG tablet Take 1 mg by mouth 3 (three) times daily.       Marland Kitchen LOVAZA 1 G capsule take 4 capsules by mouth once daily  120 capsule  8  . loxapine (LOXITANE) 10 MG capsule Take 10 mg by mouth. Take two tablets  At night      . omeprazole-sodium bicarbonate (ZEGERID) 40-1100 MG per capsule take 1 capsule by mouth twice a day  60 capsule  2  . ranitidine (ZANTAC) 300 MG  tablet TAKE 1 TABLET BY MOUTH AT BEDTIME  30 tablet  3  . simvastatin (ZOCOR) 20 MG tablet take 1 tablet by mouth at bedtime  30 tablet  5  . topiramate (TOPAMAX) 50 MG tablet Take 50 mg by mouth 2 (two) times daily.      Marland Kitchen zolpidem (AMBIEN) 10 MG tablet Take 10 mg by mouth at bedtime as needed. 1 1/2 at bedtime as needed      . Antipyrine-Benzocaine (AURALGAN) 54-14 MG/ML SOLN instill 3 drops into left ear every 2 hours if needed for pain  15 mL  0  . Casanthranol-Docusate Sodium 30-100 MG CAPS Take by mouth at bedtime. Take 2 pills at bedtime.       . Milnacipran HCl 12.5 & 25 & 50 MG MISC Take 50 mg by mouth 2 (two) times daily. Take as directed.      . Mometasone Furo-Formoterol Fum (DULERA) 200-5 MCG/ACT AERO Inhale 2 puffs into the lungs 2 (two) times daily.        . Multiple Vitamins-Minerals (ANTIOXIDANT FORMULA SG) capsule Take 1 capsule by mouth daily.        Marland Kitchen senna (SENOKOT) 8.6 MG tablet Take by mouth daily. Take 2 tabs daily        Facility-Administered Encounter Medications as of 04/04/2012  Medication Dose Route Frequency Provider Last Rate Last Dose  . albuterol (PROVENTIL) (2.5 MG/3ML) 0.083% nebulizer solution 2.5 mg  2.5 mg Nebulization Q6H PRN Agapito Games, MD   2.5 mg at 12/13/11 1004      a   Objective:   Physical Exabm  Constitutional: She is oriented to person, place, and time. She appears well-developed and well-nourished.  HENT:  Head: Normocephalic and atraumatic.  Right Ear: External ear normal.  Left Ear: External ear normal.  Nose: Nose normal.  Mouth/Throat: Oropharynx is clear and moist.       TMs and canals are clear. Right side of face/jaw is very swollen and tender. No significant erythema. No break in the skin or  lesions or pustules.  Eyes: Conjunctivae and EOM are normal. Pupils are equal, round, and reactive to light.  Neck: Neck supple. No thyromegaly present.  Cardiovascular: Normal rate, regular rhythm and normal  heart sounds.     Pulmonary/Chest: Effort normal and breath sounds normal. She has no wheezes.  Lymphadenopathy:    She has no cervical adenopathy.  Neurological: She is alert and oriented to person, place, and time.  Skin: Skin is warm and dry.       2 atypical mole, upper center back.  Psychiatric: She has a normal mood and affect. Her behavior is normal.          Assessment & Plan:  Facial cellulitis -  she has noticed some mild improvement on the clindamycin. Complete course. I would like to see her before the weekend and make sure that she's continuing to improve and going in the right direction. Call if she suddenly gets worse notices any active drainage or redness.  Acute renal insufficiency. Recheck Cr today. She certainly could not dehydrated but it may also be because of her combination of medications. Her kidney function was around 1.9 at the hospital. Her baseline is around 1.5 as seen on her last labs in March. If her creatinine is still elevated then consider discontinuing the gabapentin.  Weight gain-likely secondary to water retention since she's noticed increased swelling in her extremities. We will check her thyroid to rule out thyroid problems and check for CBC to rule out anemia. We will also check her electrolytes. Consider this could be coming from the gabapentin which she just started 3 weeks ago in addition to the blurry vision. If her blood work comes back normal then she may need to speak with her physician about stopping the gabapentin. Gabapentin can also cause acute renal failure which might have been the cause of her recent bump in creatinine.  2 atypicla moles on her back. Schedule bx in about 2 weeks after current infection cleared.

## 2012-04-05 ENCOUNTER — Telehealth: Payer: Self-pay | Admitting: *Deleted

## 2012-04-05 DIAGNOSIS — N289 Disorder of kidney and ureter, unspecified: Secondary | ICD-10-CM

## 2012-04-05 LAB — CBC WITH DIFFERENTIAL/PLATELET
Eosinophils Absolute: 0.2 10*3/uL (ref 0.0–0.7)
Eosinophils Relative: 2 % (ref 0–5)
Hemoglobin: 15 g/dL (ref 12.0–15.0)
Lymphs Abs: 1.9 10*3/uL (ref 0.7–4.0)
MCH: 32.7 pg (ref 26.0–34.0)
MCV: 96.7 fL (ref 78.0–100.0)
Monocytes Absolute: 0.7 10*3/uL (ref 0.1–1.0)
Monocytes Relative: 8 % (ref 3–12)
RBC: 4.59 MIL/uL (ref 3.87–5.11)

## 2012-04-05 LAB — URINALYSIS, ROUTINE W REFLEX MICROSCOPIC
Glucose, UA: NEGATIVE mg/dL
Leukocytes, UA: NEGATIVE
Protein, ur: NEGATIVE mg/dL
Specific Gravity, Urine: 1.009 (ref 1.005–1.030)
pH: 7 (ref 5.0–8.0)

## 2012-04-05 LAB — BASIC METABOLIC PANEL WITH GFR
CO2: 20 mEq/L (ref 19–32)
Glucose, Bld: 97 mg/dL (ref 70–99)
Potassium: 4.7 mEq/L (ref 3.5–5.3)
Sodium: 138 mEq/L (ref 135–145)

## 2012-04-05 NOTE — Telephone Encounter (Signed)
Labs for next week. 

## 2012-04-07 ENCOUNTER — Encounter: Payer: Self-pay | Admitting: Family Medicine

## 2012-04-07 ENCOUNTER — Ambulatory Visit (INDEPENDENT_AMBULATORY_CARE_PROVIDER_SITE_OTHER): Payer: BC Managed Care – PPO | Admitting: Family Medicine

## 2012-04-07 VITALS — BP 96/65 | HR 86 | Temp 97.5°F | Ht 65.0 in | Wt 189.0 lb

## 2012-04-07 DIAGNOSIS — L0201 Cutaneous abscess of face: Secondary | ICD-10-CM

## 2012-04-07 DIAGNOSIS — N289 Disorder of kidney and ureter, unspecified: Secondary | ICD-10-CM

## 2012-04-07 DIAGNOSIS — L03211 Cellulitis of face: Secondary | ICD-10-CM

## 2012-04-07 MED ORDER — CLINDAMYCIN HCL 300 MG PO CAPS: 300.0000 mg | ORAL_CAPSULE | Freq: Three times a day (TID) | ORAL | Status: AC

## 2012-04-07 NOTE — Patient Instructions (Addendum)
Call me if not much better when finish your 10 days course of antibiotics.

## 2012-04-07 NOTE — Progress Notes (Signed)
  Subjective:    Patient ID: Mandy Shaw, female    DOB: [birth year], 51 y.o.   MRN: [id number]  HPI She's here to followup on her facial cellulitis. She does feel it is significantly better but it's not completely gone it is still tender to touch. She has been on the clindamycin for approximately 5 days. She was given a prescription for a seven-day course. She has not run any fevers. She says that her jaw is still getting away when she chews and will accidentally bite on it.  Also following up on acute renal insufficiency-her baseline creatinine is around 1.5. It was elevated to 1.9 when she went to the emergency department. She was also having blurry vision. I strongly felt this was due to her gabapentin so she stopped it 2 days ago.   Review of Systems     Objective:   Physical Exam  Constitutional: She is oriented to person, place, and time. She appears well-developed and well-nourished.  HENT:  Head: Normocephalic and atraumatic.       Right cheek is still mildly swollen and is tender but much improved from 3 days ago.   Neurological: She is alert and oriented to person, place, and time.  Skin: Skin is warm and dry.  Psychiatric: She has a normal mood and affect. Her behavior is normal.          Assessment & Plan:  Facial cellulitis-significantly improved today. I wrote a new prescription for an additional 3 days of clindamycin, a total of a ten-day course. If at the end of the 10 day she's not close to 100% better then she is to call the office back and will consider changing antibiotic. I think she is a little discouraged about how slowly the medication is working but I tried to give her assurance that is improving.  Acute renal insufficiency-I. do think this was secondary to the gabapentin. She will go to recheck her kidney function next week to see if it comes back down to baseline off of the gabapentin. We will also see the blurry vision improves in the next couple weeks. She  actually has an appointment with Dr. Estella Husk in about 2 weeks. He is the original provider who prescribed a Gabapentin for her peripheral neuropathy.

## 2012-04-13 LAB — BASIC METABOLIC PANEL WITH GFR
CO2: 23 mEq/L (ref 19–32)
Calcium: 9.1 mg/dL (ref 8.4–10.5)
GFR, Est African American: 37 mL/min — ABNORMAL LOW
Potassium: 4.3 mEq/L (ref 3.5–5.3)
Sodium: 137 mEq/L (ref 135–145)

## 2012-04-16 ENCOUNTER — Other Ambulatory Visit: Payer: Self-pay | Admitting: Family Medicine

## 2012-04-18 ENCOUNTER — Telehealth: Payer: Self-pay | Admitting: *Deleted

## 2012-04-18 MED ORDER — DOXYCYCLINE HYCLATE 100 MG PO TABS: 100.0000 mg | ORAL_TABLET | Freq: Two times a day (BID) | ORAL | Status: AC

## 2012-04-18 NOTE — Telephone Encounter (Signed)
I will call in a new ABX. If not improving in 48 hours then let me know.

## 2012-04-18 NOTE — Telephone Encounter (Signed)
Pt informed

## 2012-04-18 NOTE — Telephone Encounter (Signed)
Pt states she finished abx about a week ago that she was given for cellulitis of the face. States the edema in her face went down some but know that she is done with abx the edema is coming back and it is painful again. States the edema never totally went away. Please advise.

## 2012-04-20 ENCOUNTER — Telehealth: Payer: Self-pay | Admitting: *Deleted

## 2012-04-20 DIAGNOSIS — R6884 Jaw pain: Secondary | ICD-10-CM

## 2012-04-20 NOTE — Telephone Encounter (Signed)
I will put in order for ENT. Will see if ENT can see her Today or tomorrow

## 2012-04-20 NOTE — Telephone Encounter (Signed)
Pt informed

## 2012-04-20 NOTE — Telephone Encounter (Signed)
Pt states her jaw is swollen more and she is in pain. States she feels the abx are not working. Please advise on what she should do.

## 2012-04-26 ENCOUNTER — Telehealth: Payer: Self-pay | Admitting: Family

## 2012-04-26 NOTE — Telephone Encounter (Signed)
Forward 3 pages from [facility] to Dr. Sandford Craze for review on 04-26-12 ym

## 2012-05-16 ENCOUNTER — Other Ambulatory Visit: Payer: Self-pay | Admitting: Family Medicine

## 2012-05-29 ENCOUNTER — Encounter: Payer: Self-pay | Admitting: Family Medicine

## 2012-05-29 ENCOUNTER — Other Ambulatory Visit: Payer: Self-pay | Admitting: Family Medicine

## 2012-05-29 ENCOUNTER — Ambulatory Visit (INDEPENDENT_AMBULATORY_CARE_PROVIDER_SITE_OTHER): Payer: BC Managed Care – PPO | Admitting: Family Medicine

## 2012-05-29 VITALS — BP 101/70 | HR 86 | Wt 184.0 lb

## 2012-05-29 DIAGNOSIS — D229 Melanocytic nevi, unspecified: Secondary | ICD-10-CM

## 2012-05-29 DIAGNOSIS — D235 Other benign neoplasm of skin of trunk: Secondary | ICD-10-CM

## 2012-05-29 NOTE — Progress Notes (Signed)
  Subjective:    Patient ID: Mandy Shaw, female    DOB: [birth year], 51 y.o.   MRN: [id number]  HPI    Review of Systems     Objective:   Physical Exam        Assessment & Plan:   Shave Biopsy Procedure Note  Pre-operative Diagnosis: Dysplastic nevi  Post-operative Diagnosis: same  Locations:right mid back   Indications: atypical nevus  Anesthesia: Lidocaine 1% with epinephrine without added sodium bicarbonate  Procedure Details  History of allergy to iodine: no  Patient informed of the risks (including bleeding and infection) and benefits of the  procedure and Verbal informed consent obtained.  The lesion and surrounding area were given a sterile prep using betadyne and draped in the usual sterile fashion. A scalpel was used to shave an area of skin approximately 0.3 cm by 0.3 cm on the mid back.  Hemostasis achieved with alumuninum chloride. 2nd lesion removed on the right upper back that is approx 0.3 x 0.4 cm.  Antibiotic ointment and a sterile dressing applied.  The specimen was sent for pathologic examination. The patient tolerated the procedure well.  EBL: 0 ml  Findings: Will send to path  Condition: Stable  Complications: none.  Plan: 1. Instructed to keep the wound dry and covered for 24-48h and clean thereafter. 2. Warning signs of infection were reviewed.   3. Recommended that the patient use OTC acetaminophen as needed for pain.  4. Return in prn.

## 2012-05-29 NOTE — Patient Instructions (Addendum)
Wash normally tomorrow No alcohol or peroxide Can apply vaseline daily for 1-2 weeks.

## 2012-06-26 ENCOUNTER — Ambulatory Visit (INDEPENDENT_AMBULATORY_CARE_PROVIDER_SITE_OTHER): Payer: BC Managed Care – PPO | Admitting: Family Medicine

## 2012-06-26 ENCOUNTER — Encounter: Payer: Self-pay | Admitting: Family Medicine

## 2012-06-26 VITALS — BP 113/81 | HR 84 | Wt 186.0 lb

## 2012-06-26 DIAGNOSIS — R3989 Other symptoms and signs involving the genitourinary system: Secondary | ICD-10-CM

## 2012-06-26 DIAGNOSIS — R413 Other amnesia: Secondary | ICD-10-CM

## 2012-06-26 DIAGNOSIS — R399 Unspecified symptoms and signs involving the genitourinary system: Secondary | ICD-10-CM

## 2012-06-26 LAB — POCT URINALYSIS DIPSTICK
Protein, UA: NEGATIVE
Urobilinogen, UA: 0.2

## 2012-06-26 MED ORDER — CIPROFLOXACIN HCL 500 MG PO TABS: 500.0000 mg | ORAL_TABLET | Freq: Two times a day (BID) | ORAL | Status: DC

## 2012-06-26 NOTE — Progress Notes (Signed)
  Subjective:    Patient ID: Mandy Shaw, female    DOB: [birth year], 51 y.o.   MRN: [id number]  HPI UTI sxs for 3 weeks. She is having frequency and urgency. Waking her up multiple times at night. Using lost of water, AZO and cranberry juice with no relief.  No fever.  No hematuria.   Had a nurse assessment by [employer] .  They recommend checking Vtamin D.  She had a lot of problems with her memory for years. She did have abnormal clockdrawing and work recall is 2/3 words.     Review of Systems     Objective:   Physical Exam  Constitutional: She is oriented to person, place, and time. She appears well-developed and well-nourished.  Musculoskeletal:       No CVA tenderness.   Neurological: She is alert and oriented to person, place, and time.  Skin: Skin is warm and dry.  Psychiatric: She has a normal mood and affect. Her behavior is normal.          Assessment & Plan:  Dysuria- UA is + . We'll treat with Cipro since she has been somewhat of allergies. Call if not significantly better in 3 days.  Memory difficulty - Rec she talk to her psyc next week.  if he does not want to work this up further than I recommend that she call me back and we can schedule referral to a neuropsychiatrist for further memory testing and evaluation to see if we can pinpoint a cause and effect causes something we can reverse or potentially prevent progression.

## 2012-06-26 NOTE — Patient Instructions (Signed)
Urinary Tract Infection Urinary tract infections (UTIs) can develop anywhere along your urinary tract. Your urinary tract is your body's drainage system for removing wastes and extra water. Your urinary tract includes two kidneys, two ureters, a bladder, and a urethra. Your kidneys are a pair of bean-shaped organs. Each kidney is about the size of your fist. They are located below your ribs, one on each side of your spine. CAUSES Infections are caused by microbes, which are microscopic organisms, including fungi, viruses, and bacteria. These organisms are so small that they can only be seen through a microscope. Bacteria are the microbes that most commonly cause UTIs. SYMPTOMS  Symptoms of UTIs may vary by age and gender of the patient and by the location of the infection. Symptoms in young women typically include a frequent and intense urge to urinate and a painful, burning feeling in the bladder or urethra during urination. Older women and men are more likely to be tired, shaky, and weak and have muscle aches and abdominal pain. A fever may mean the infection is in your kidneys. Other symptoms of a kidney infection include pain in your back or sides below the ribs, nausea, and vomiting. DIAGNOSIS To diagnose a UTI, your caregiver will ask you about your symptoms. Your caregiver also will ask to provide a urine sample. The urine sample will be tested for bacteria and white blood cells. White blood cells are made by your body to help fight infection. TREATMENT  Typically, UTIs can be treated with medication. Because most UTIs are caused by a bacterial infection, they usually can be treated with the use of antibiotics. The choice of antibiotic and length of treatment depend on your symptoms and the type of bacteria causing your infection. HOME CARE INSTRUCTIONS  If you were prescribed antibiotics, take them exactly as your caregiver instructs you. Finish the medication even if you feel better after you  have only taken some of the medication.  Drink enough water and fluids to keep your urine clear or pale yellow.  Avoid caffeine, tea, and carbonated beverages. They tend to irritate your bladder.  Empty your bladder often. Avoid holding urine for long periods of time.  Empty your bladder before and after sexual intercourse.  After a bowel movement, women should cleanse from front to back. Use each tissue only once. SEEK MEDICAL CARE IF:   You have back pain.  You develop a fever.  Your symptoms do not begin to resolve within 3 days. SEEK IMMEDIATE MEDICAL CARE IF:   You have severe back pain or lower abdominal pain.  You develop chills.  You have nausea or vomiting.  You have continued burning or discomfort with urination. MAKE SURE YOU:   Understand these instructions.  Will watch your condition.  Will get help right away if you are not doing well or get worse. Document Released: 06/16/2005 Document Revised: 03/07/2012 Document Reviewed: 10/15/2011 [employer] Patient Information 2013 [employer].  

## 2012-07-03 ENCOUNTER — Telehealth: Payer: Self-pay | Admitting: *Deleted

## 2012-07-03 DIAGNOSIS — N3289 Other specified disorders of bladder: Secondary | ICD-10-CM

## 2012-07-03 MED ORDER — SULFAMETHOXAZOLE-TRIMETHOPRIM 800-160 MG PO TABS: 1.0000 | ORAL_TABLET | Freq: Two times a day (BID) | ORAL | Status: DC

## 2012-07-03 NOTE — Telephone Encounter (Signed)
Call patient: I will go ahead and send her for new prescription for a different antibiotic. I would also like to come by to drop off in the urine sample tomorrow so we can send it for culture.

## 2012-07-03 NOTE — Telephone Encounter (Signed)
Pt finished antibiotic for UTI on Thursday and was feeling better while on it but when stopped she has started having spasms again and keeping her up all night.  Also she showed this clock ( diagnostic tool) to her psych and he told her that he wanted you to refer her to a neuro psychologist. Please advise

## 2012-07-04 ENCOUNTER — Ambulatory Visit: Payer: BC Managed Care – PPO | Admitting: Family Medicine

## 2012-07-04 VITALS — BP 100/70 | HR 79 | Temp 98.1°F

## 2012-07-04 DIAGNOSIS — N390 Urinary tract infection, site not specified: Secondary | ICD-10-CM

## 2012-07-04 NOTE — Telephone Encounter (Signed)
Pt notiifed and will come by today for urine culture

## 2012-07-05 ENCOUNTER — Telehealth: Payer: Self-pay | Admitting: *Deleted

## 2012-07-05 DIAGNOSIS — R413 Other amnesia: Secondary | ICD-10-CM

## 2012-07-05 NOTE — Telephone Encounter (Signed)
Pt seen pain MD yesterday Dr. Oneal Grout and she suggested her to see someone at [facility] rather than a neuro psychologist as suggested before. Pt would like to get a referral. Place Dr. Oneal Grout recommended is called [facility] number is [phone]

## 2012-07-05 NOTE — Telephone Encounter (Signed)
Referral made to [facility].

## 2012-07-05 NOTE — Progress Notes (Signed)
  Subjective:    Patient ID: Mandy Shaw, female    DOB: [birth year], 51 y.o.   MRN: [id number]  HPI    Review of Systems Patient came in today for nurse visit, vital signs taken  She was unable to void to leave a urine spec and stated she would come back in the afternoon but did not show back up.    Objective:   Physical Exam        Assessment & Plan:

## 2012-07-10 ENCOUNTER — Telehealth: Payer: Self-pay | Admitting: Family Medicine

## 2012-07-10 NOTE — Telephone Encounter (Signed)
I called to schedule [facility] and Mandy Shaw there states that she thinks pt needs Neuro Psych [phone]

## 2012-08-06 ENCOUNTER — Other Ambulatory Visit: Payer: Self-pay | Admitting: Family Medicine

## 2012-08-07 ENCOUNTER — Ambulatory Visit (INDEPENDENT_AMBULATORY_CARE_PROVIDER_SITE_OTHER): Payer: BC Managed Care – PPO | Admitting: Family Medicine

## 2012-08-07 ENCOUNTER — Encounter: Payer: Self-pay | Admitting: Family Medicine

## 2012-08-07 VITALS — BP 99/65 | HR 84 | Temp 97.6°F | Wt 186.0 lb

## 2012-08-07 DIAGNOSIS — J40 Bronchitis, not specified as acute or chronic: Secondary | ICD-10-CM

## 2012-08-07 DIAGNOSIS — J441 Chronic obstructive pulmonary disease with (acute) exacerbation: Secondary | ICD-10-CM

## 2012-08-07 DIAGNOSIS — R399 Unspecified symptoms and signs involving the genitourinary system: Secondary | ICD-10-CM

## 2012-08-07 DIAGNOSIS — R3989 Other symptoms and signs involving the genitourinary system: Secondary | ICD-10-CM

## 2012-08-07 DIAGNOSIS — R05 Cough: Secondary | ICD-10-CM

## 2012-08-07 DIAGNOSIS — R059 Cough, unspecified: Secondary | ICD-10-CM

## 2012-08-07 LAB — POCT URINALYSIS DIPSTICK
Bilirubin, UA: NEGATIVE
Blood, UA: NEGATIVE
Glucose, UA: NEGATIVE
Ketones, UA: NEGATIVE
Leukocytes, UA: NEGATIVE
Nitrite, UA: NEGATIVE
Protein, UA: NEGATIVE
Spec Grav, UA: <=1.005
Urobilinogen, UA: 0.2
pH, UA: 6

## 2012-08-07 MED ORDER — MOMETASONE FURO-FORMOTEROL FUM 200-5 MCG/ACT IN AERO: 2.0000 | INHALATION_SPRAY | Freq: Two times a day (BID) | RESPIRATORY_TRACT | Status: DC

## 2012-08-07 MED ORDER — DOXYCYCLINE HYCLATE 100 MG PO TABS: 100.0000 mg | ORAL_TABLET | Freq: Two times a day (BID) | ORAL | Status: DC

## 2012-08-07 MED ORDER — HYDROCODONE-HOMATROPINE 5-1.5 MG/5ML PO SYRP: 5.0000 mL | ORAL_SOLUTION | Freq: Every evening | ORAL | Status: DC | PRN

## 2012-08-07 MED ORDER — ALBUTEROL SULFATE (5 MG/ML) 0.5% IN NEBU
2.5000 mg | INHALATION_SOLUTION | Freq: Once | RESPIRATORY_TRACT | Status: AC
  Administered 2012-08-07: 2.5 mg via RESPIRATORY_TRACT

## 2012-08-07 NOTE — Patient Instructions (Addendum)
Mucinex, which is an expectorant as well. Acute Bronchitis You have acute bronchitis. This means you have a chest cold. The airways in your lungs are red and sore (inflamed). Acute means it is sudden onset.   CAUSES Bronchitis is most often caused by the same virus that causes a cold. SYMPTOMS    Body aches.   Chest congestion.   Chills.   Cough.   Fever.   Shortness of breath.   Sore throat.  TREATMENT   Acute bronchitis is usually treated with rest, fluids, and medicines for relief of fever or cough. Most symptoms should go away after a few days or a week. Increased fluids may help thin your secretions and will prevent dehydration. Your caregiver may give you an inhaler to improve your symptoms. The inhaler reduces shortness of breath and helps control cough. You can take over-the-counter pain relievers or cough medicine to decrease coughing, pain, or fever. A cool-air vaporizer may help thin bronchial secretions and make it easier to clear your chest. Antibiotics are usually not needed but can be prescribed if you smoke, are seriously ill, have chronic lung problems, are elderly, or you are at higher risk for developing complications. Allergies and asthma can make bronchitis worse. Repeated episodes of bronchitis may cause longstanding lung problems. Avoid smoking and secondhand smoke. Exposure to cigarette smoke or irritating chemicals will make bronchitis worse. If you are a cigarette smoker, consider using nicotine gum or skin patches to help control withdrawal symptoms. Quitting smoking will help your lungs heal faster. Recovery from bronchitis is often slow, but you should start feeling better after 2 to 3 days. Cough from bronchitis frequently lasts for 3 to 4 weeks. To prevent another bout of acute bronchitis:  Quit smoking.   Wash your hands frequently to get rid of viruses or use a hand sanitizer.   Avoid other people with cold or virus symptoms.   Try not to touch your  hands to your mouth, nose, or eyes.  SEEK IMMEDIATE MEDICAL CARE IF:  You develop increased fever, chills, or chest pain.   You have severe shortness of breath or bloody sputum.   You develop dehydration, fainting, repeated vomiting, or a severe headache.   You have no improvement after 1 week of treatment or you get worse.  MAKE SURE YOU:    Understand these instructions.   Will watch your condition.   Will get help right away if you are not doing well or get worse.  Document Released: 10/14/2004 Document Revised: 11/29/2011 Document Reviewed: 12/30/2010 [facility] Patient Information 2013 [place], [place].   Sinusitis Sinusitis is redness, soreness, and swelling (inflammation) of the paranasal sinuses. Paranasal sinuses are air pockets within the bones of your face (beneath the eyes, the middle of the forehead, or above the eyes). In healthy paranasal sinuses, mucus is able to drain out, and air is able to circulate through them by way of your nose. However, when your paranasal sinuses are inflamed, mucus and air can become trapped. This can allow bacteria and other germs to grow and cause infection. Sinusitis can develop quickly and last only a short time (acute) or continue over a long period (chronic). Sinusitis that lasts for more than 12 weeks is considered chronic.   CAUSES   Causes of sinusitis include:  Allergies.   Structural abnormalities, such as displacement of the cartilage that separates your nostrils (deviated septum), which can decrease the air flow through your nose and sinuses and affect sinus drainage.  Functional abnormalities, such as when the small hairs (cilia) that line your sinuses and help remove mucus do not work properly or are not present.  SYMPTOMS   Symptoms of acute and chronic sinusitis are the same. The primary symptoms are pain and pressure around the affected sinuses. Other symptoms include:  Upper toothache.   Earache.   Headache.   Bad  breath.   Decreased sense of smell and taste.   A cough, which worsens when you are lying flat.   Fatigue.   Fever.   Thick drainage from your nose, which often is green and may contain pus (purulent).   Swelling and warmth over the affected sinuses.  DIAGNOSIS   Your caregiver will perform a physical exam. During the exam, your caregiver may:  Look in your nose for signs of abnormal growths in your nostrils (nasal polyps).   Tap over the affected sinus to check for signs of infection.   View the inside of your sinuses (endoscopy) with a special imaging device with a light attached (endoscope), which is inserted into your sinuses.  If your caregiver suspects that you have chronic sinusitis, one or more of the following tests may be recommended:  Allergy tests.   Nasal culture A sample of mucus is taken from your nose and sent to a lab and screened for bacteria.   Nasal cytology A sample of mucus is taken from your nose and examined by your caregiver to determine if your sinusitis is related to an allergy.  TREATMENT   Most cases of acute sinusitis are related to a viral infection and will resolve on their own within 10 days. Sometimes medicines are prescribed to help relieve symptoms (pain medicine, decongestants, nasal steroid sprays, or saline sprays).   However, for sinusitis related to a bacterial infection, your caregiver will prescribe antibiotic medicines. These are medicines that will help kill the bacteria causing the infection.   Rarely, sinusitis is caused by a fungal infection. In theses cases, your caregiver will prescribe antifungal medicine. For some cases of chronic sinusitis, surgery is needed. Generally, these are cases in which sinusitis recurs more than 3 times per year, despite other treatments. HOME CARE INSTRUCTIONS    Drink plenty of water. Water helps thin the mucus so your sinuses can drain more easily.   Use a humidifier.   Inhale steam 3 to 4 times a  day (for example, sit in the bathroom with the shower running).   Apply a warm, moist washcloth to your face 3 to 4 times a day, or as directed by your caregiver.   Use saline nasal sprays to help moisten and clean your sinuses.   Take over-the-counter or prescription medicines for pain, discomfort, or fever only as directed by your caregiver.  SEEK IMMEDIATE MEDICAL CARE IF:  You have increasing pain or severe headaches.   You have nausea, vomiting, or drowsiness.   You have swelling around your face.   You have vision problems.   You have a stiff neck.   You have difficulty breathing.  MAKE SURE YOU:    Understand these instructions.   Will watch your condition.   Will get help right away if you are not doing well or get worse.  Document Released: 09/06/2005 Document Revised: 11/29/2011 Document Reviewed: 09/21/2011 [facility] Patient Information 2013 [place], [place].

## 2012-08-07 NOTE — Progress Notes (Signed)
  Subjective:    Patient ID: Mandy Shaw, female    DOB: 1961-01-14, 51 y.o.   MRN: [id number]  HPI 3 weeks of URi sx.  Nasal drainage. Cough mostly at night.  Says nasl drianage is clear. Says constanlty wants to cough. Has felt some SOB.  Thought had a fever last night.  No  facial pain or pressure. No ST or ear pain. Off of her dulera.    UTi sxs this aM.  + dysuria. No hematuria or frequency. Tx for UTI about 4 weeks ago.    Review of Systems     Objective:   Physical Exam  Constitutional: She is oriented to person, place, and time. She appears well-developed and well-nourished.  HENT:  Head: Normocephalic and atraumatic.  Right Ear: External ear normal.  Left Ear: External ear normal.  Nose: Nose normal.  Mouth/Throat: Oropharynx is clear and moist.       TMs and canals are clear.   Eyes: Conjunctivae normal and EOM are normal. Pupils are equal, round, and reactive to light.  Neck: Neck supple. No thyromegaly present.  Cardiovascular: Normal rate, regular rhythm and normal heart sounds.   Pulmonary/Chest: Effort normal and breath sounds normal. She has no wheezes.       She had diffuse wheezing on exam with rhonchi. Wheezing mostly resolved after neb tx.    Lymphadenopathy:    She has no cervical adenopathy.  Neurological: She is alert and oriented to person, place, and time.  Skin: Skin is warm and dry.  Psychiatric: She has a normal mood and affect.          Assessment & Plan:  Acute sinusitis/bronchitis-will treat with doxycycline. She'll a lot of wheezing on exam today. Given albuterol neb treatment here in the office. Pulse ox is 97% which is reassuring. If she's not getting better over the next 4-5 days then consider chest x-ray.Call if not sig better in one week.   COPD - Need to restart her dulera.  Given NEB tx in the office and she sounded and felt so much better afterwards. I did remove a computer all from her allergy list and she has previously been on [facility]  without any difficulty and she received a nebulizer treatment her office without any difficulty or signs or symptoms. In fact she felt much better after the breathing treatment.  Dysuria-urinalysis is negative. Not enough to send for culture. If sxs persist then re-collect samples.

## 2012-08-16 ENCOUNTER — Telehealth: Payer: Self-pay | Admitting: *Deleted

## 2012-08-16 NOTE — Telephone Encounter (Signed)
Is she doing 2 puff BID or QD? Can do BID.

## 2012-08-16 NOTE — Telephone Encounter (Signed)
Patient wants to know if she can increase Duelera to more then 2 puffs a day.  You prescribed 2 puffs daily

## 2012-08-17 ENCOUNTER — Other Ambulatory Visit: Payer: Self-pay | Admitting: Family Medicine

## 2012-09-05 ENCOUNTER — Encounter: Payer: Self-pay | Admitting: Obstetrics & Gynecology

## 2012-09-05 ENCOUNTER — Ambulatory Visit (INDEPENDENT_AMBULATORY_CARE_PROVIDER_SITE_OTHER): Payer: BC Managed Care – PPO | Admitting: Obstetrics & Gynecology

## 2012-09-05 VITALS — BP 97/69 | HR 94 | Temp 98.5°F | Resp 16 | Ht 65.0 in | Wt 180.0 lb

## 2012-09-05 DIAGNOSIS — T83711A Erosion of implanted vaginal mesh and other prosthetic materials to surrounding organ or tissue, initial encounter: Secondary | ICD-10-CM

## 2012-09-05 DIAGNOSIS — T8389XA Other specified complication of genitourinary prosthetic devices, implants and grafts, initial encounter: Secondary | ICD-10-CM

## 2012-09-05 DIAGNOSIS — B009 Herpesviral infection, unspecified: Secondary | ICD-10-CM

## 2012-09-05 NOTE — Progress Notes (Signed)
  Subjective:    Patient ID: Mandy Shaw, female    DOB: [birth year], 51 y.o.   MRN: [id number]  HPI  51 yo [place] lady with a history of 2 bladder surgeries, at least one of which was a sling done in [place] years ago. She comes in to the office today because her husband has been experiencing a painful sensation on his penis with vaginal intercourse for the last 2 months. She denies vaginal dryness and only one recent HSV outbreak.   Review of Systems Her pap and mammogram were normal in April 2013. She still has daily incontinence and wears pads daily.    Objective:   Physical Exam  I can feel, but not see, an erosion of her vaginal mesh about 2 cm in from her urethra      Assessment & Plan:  Sling erosion and daily incontinence- I will refer her to [facility].

## 2012-09-06 ENCOUNTER — Telehealth: Payer: Self-pay

## 2012-09-06 NOTE — Telephone Encounter (Signed)
Sch. Pt to see [facility]( [address] [place]) on Thu. Dec. 26th at 9:30.

## 2012-09-08 ENCOUNTER — Other Ambulatory Visit: Payer: Self-pay | Admitting: *Deleted

## 2012-09-08 ENCOUNTER — Other Ambulatory Visit: Payer: Self-pay | Admitting: Family Medicine

## 2012-09-08 MED ORDER — SIMVASTATIN 20 MG PO TABS: 20.0000 mg | ORAL_TABLET | Freq: Every day | ORAL | Status: DC

## 2012-09-16 ENCOUNTER — Other Ambulatory Visit: Payer: Self-pay | Admitting: Family Medicine

## 2012-09-18 DIAGNOSIS — M5412 Radiculopathy, cervical region: Secondary | ICD-10-CM | POA: Insufficient documentation

## 2012-09-27 ENCOUNTER — Encounter: Payer: Self-pay | Admitting: *Deleted

## 2012-10-11 DIAGNOSIS — G43709 Chronic migraine without aura, not intractable, without status migrainosus: Secondary | ICD-10-CM | POA: Insufficient documentation

## 2012-10-13 ENCOUNTER — Encounter: Payer: Self-pay | Admitting: Family Medicine

## 2012-10-13 ENCOUNTER — Ambulatory Visit (INDEPENDENT_AMBULATORY_CARE_PROVIDER_SITE_OTHER): Payer: BC Managed Care – PPO | Admitting: Family Medicine

## 2012-10-13 VITALS — BP 89/62 | HR 86 | Temp 97.9°F | Resp 18 | Wt 184.0 lb

## 2012-10-13 DIAGNOSIS — J209 Acute bronchitis, unspecified: Secondary | ICD-10-CM

## 2012-10-13 DIAGNOSIS — J441 Chronic obstructive pulmonary disease with (acute) exacerbation: Secondary | ICD-10-CM

## 2012-10-13 DIAGNOSIS — H04209 Unspecified epiphora, unspecified lacrimal gland: Secondary | ICD-10-CM

## 2012-10-13 DIAGNOSIS — H04203 Unspecified epiphora, bilateral lacrimal glands: Secondary | ICD-10-CM

## 2012-10-13 MED ORDER — DOXYCYCLINE HYCLATE 100 MG PO TABS: 100.0000 mg | ORAL_TABLET | Freq: Two times a day (BID) | ORAL | Status: DC

## 2012-10-13 MED ORDER — IPRATROPIUM BROMIDE 0.02 % IN SOLN
0.5000 mg | Freq: Once | RESPIRATORY_TRACT | Status: AC
  Administered 2012-10-13: 0.5 mg via RESPIRATORY_TRACT

## 2012-10-13 MED ORDER — ALBUTEROL SULFATE (5 MG/ML) 0.5% IN NEBU
2.5000 mg | INHALATION_SOLUTION | Freq: Once | RESPIRATORY_TRACT | Status: AC
  Administered 2012-10-13: 2.5 mg via RESPIRATORY_TRACT

## 2012-10-13 MED ORDER — PREDNISONE 20 MG PO TABS: 40.0000 mg | ORAL_TABLET | Freq: Every day | ORAL | Status: DC

## 2012-10-13 MED ORDER — ALBUTEROL SULFATE HFA 108 (90 BASE) MCG/ACT IN AERS: 2.0000 | INHALATION_SPRAY | Freq: Four times a day (QID) | RESPIRATORY_TRACT | Status: DC | PRN

## 2012-10-13 NOTE — Patient Instructions (Signed)
Chronic Obstructive Pulmonary Disease Chronic obstructive pulmonary disease (COPD) is a condition in which airflow from the lungs is restricted. The lungs can never return to normal, but there are measures you can take which will improve them and make you feel better. CAUSES    Smoking.   Exposure to secondhand smoke.   Breathing in irritants (pollution, cigarette smoke, strong smells, aerosol sprays, paint fumes).   History of lung infections.  TREATMENT   Treatment focuses on making you comfortable (supportive care). Your caregiver may prescribe medications (inhaled or pills) to help improve your breathing. HOME CARE INSTRUCTIONS    If you smoke, stop smoking.   Avoid exposure to smoke, chemicals, and fumes that aggravate your breathing.   Take antibiotic medicines as directed by your caregiver.   Avoid medicines that dry up your system and slow down the elimination of secretions (antihistamines and cough syrups). This decreases respiratory capacity and may lead to infections.   Drink enough water and fluids to keep your urine clear or pale yellow. This loosens secretions.   Use humidifiers at home and at your bedside if they do not make breathing difficult.   Receive all protective vaccines your caregiver suggests, especially pneumococcal and influenza.   Use home oxygen as suggested.   Stay active. Exercise and physical activity will help maintain your ability to do things you want to do.   Eat a healthy diet.  SEEK MEDICAL CARE IF:    You develop pus-like mucus (sputum).   Breathing is more labored or exercise becomes difficult to do.   You are running out of the medicine you take for your breathing.  SEEK IMMEDIATE MEDICAL CARE IF:    You have a rapid heart rate.   You have agitation, confusion, tremors, or are in a stupor (family members may need to observe this).   It becomes difficult to breathe.   You develop chest pain.   You have a fever.  MAKE SURE  YOU:    Understand these instructions.   Will watch your condition.   Will get help right away if you are not doing well or get worse.  Document Released: 06/16/2005 Document Revised: 11/29/2011 Document Reviewed: 11/06/2010 [facility] Patient Information 2013 [place], [place].   Acute Bronchitis You have acute bronchitis. This means you have a chest cold. The airways in your lungs are red and sore (inflamed). Acute means it is sudden onset.   CAUSES Bronchitis is most often caused by the same virus that causes a cold. SYMPTOMS    Body aches.   Chest congestion.   Chills.   Cough.   Fever.   Shortness of breath.   Sore throat.  TREATMENT   Acute bronchitis is usually treated with rest, fluids, and medicines for relief of fever or cough. Most symptoms should go away after a few days or a week. Increased fluids may help thin your secretions and will prevent dehydration. Your caregiver may give you an inhaler to improve your symptoms. The inhaler reduces shortness of breath and helps control cough. You can take over-the-counter pain relievers or cough medicine to decrease coughing, pain, or fever. A cool-air vaporizer may help thin bronchial secretions and make it easier to clear your chest. Antibiotics are usually not needed but can be prescribed if you smoke, are seriously ill, have chronic lung problems, are elderly, or you are at higher risk for developing complications. Allergies and asthma can make bronchitis worse. Repeated episodes of bronchitis may cause longstanding lung problems.  Avoid smoking and secondhand smoke. Exposure to cigarette smoke or irritating chemicals will make bronchitis worse. If you are a cigarette smoker, consider using nicotine gum or skin patches to help control withdrawal symptoms. Quitting smoking will help your lungs heal faster. Recovery from bronchitis is often slow, but you should start feeling better after 2 to 3 days. Cough from bronchitis frequently  lasts for 3 to 4 weeks. To prevent another bout of acute bronchitis:  Quit smoking.   Wash your hands frequently to get rid of viruses or use a hand sanitizer.   Avoid other people with cold or virus symptoms.   Try not to touch your hands to your mouth, nose, or eyes.  SEEK IMMEDIATE MEDICAL CARE IF:  You develop increased fever, chills, or chest pain.   You have severe shortness of breath or bloody sputum.   You develop dehydration, fainting, repeated vomiting, or a severe headache.   You have no improvement after 1 week of treatment or you get worse.  MAKE SURE YOU:    Understand these instructions.   Will watch your condition.   Will get help right away if you are not doing well or get worse.  Document Released: 10/14/2004 Document Revised: 11/29/2011 Document Reviewed: 12/30/2010 [facility] Patient Information 2013 [place], [place].

## 2012-10-13 NOTE — Addendum Note (Signed)
Addended by: Chalmers Cater on: 10/13/2012 01:05 PM   Modules accepted: Orders

## 2012-10-13 NOTE — Progress Notes (Signed)
  Subjective:    Patient ID: Mandy Shaw, female    DOB: 10/02/1960, 52 y.o.   MRN: [id number]  HPI Cough and SOB x 1 mo. Woke up this AM with very dry eyes as well.  Left eye was tearing after that and it was making her nose run.  Eye feels a lot better.  Dulera 2 puffs BID.  Chest feel tight with deep breath. Has more sputum in the AM.  Still smoking.  Cough is more dry during the daytime.  No eye pain or discharge. No worsening or alleviating symptoms. She continues to smoke one half packs per day. She has not been using her albuterol inhaler she says she is out. No significant nasal congestion but she does have some mild intermittent nasal congestion. She says the left side of her throat and hurting for the last 2 days. She has had some postnasal drip. No GI symptoms. No fever.   Review of Systems     Objective:   Physical Exam  Constitutional: She is oriented to person, place, and time. She appears well-developed and well-nourished.  HENT:  Head: Normocephalic and atraumatic.  Right Ear: External ear normal.  Left Ear: External ear normal.  Nose: Nose normal.  Mouth/Throat: Oropharynx is clear and moist.       TMs and canals are clear. No discharge from either eye. She does have a little bit of dry crusting along the lash lines. No watering. No sign of injury. Conjunctiva and sclera are not injected. No swelling of the lids.  Eyes: Conjunctivae normal and EOM are normal. Pupils are equal, round, and reactive to light.  Neck: Neck supple. No thyromegaly present.  Cardiovascular: Normal rate, regular rhythm and normal heart sounds.   Pulmonary/Chest: Effort normal and breath sounds normal. She has no wheezes.  Lymphadenopathy:    She has no cervical adenopathy.  Neurological: She is alert and oriented to person, place, and time.  Skin: Skin is warm and dry.  Psychiatric: She has a normal mood and affect.          Assessment & Plan:  COPD exacerbation - I. 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 We'll treat with a round of steroids as well as antibiotics. She's had symptoms for a month at this point. She has not been using any rescue albuterol because she said she did have a separate inhaler besides her Dulera. The she has been using her Dulera regularly which is good. We did give her an albuterol and Atrovent nebulizer treatment here in the office. She did feel some relief afterwards. I made sure to send a new prescription for an albuterol inhaler to use when necessary to her pharmacy. She evidently did not have one at home.  Acute bronchitis - will treat with doxycycline and 5 days of prednisone. Followup in 2-3 weeks to make sure that she is improving.  Watery eye - likely related to her recent illness. I see no evidence of trauma to the eye. She's not having any vision changes or eye pain. Call if her symptoms change.

## 2012-10-22 ENCOUNTER — Other Ambulatory Visit: Payer: Self-pay | Admitting: Family Medicine

## 2012-11-16 ENCOUNTER — Other Ambulatory Visit: Payer: Self-pay | Admitting: Family Medicine

## 2012-12-11 ENCOUNTER — Other Ambulatory Visit: Payer: Self-pay | Admitting: Family Medicine

## 2012-12-11 ENCOUNTER — Encounter: Payer: Self-pay | Admitting: Family Medicine

## 2012-12-11 ENCOUNTER — Ambulatory Visit (INDEPENDENT_AMBULATORY_CARE_PROVIDER_SITE_OTHER): Payer: BC Managed Care – PPO | Admitting: Family Medicine

## 2012-12-11 VITALS — BP 107/66 | HR 98 | Ht 66.0 in | Wt 172.0 lb

## 2012-12-11 DIAGNOSIS — F5000 Anorexia nervosa, unspecified: Secondary | ICD-10-CM

## 2012-12-11 DIAGNOSIS — R5383 Other fatigue: Secondary | ICD-10-CM

## 2012-12-11 DIAGNOSIS — R5381 Other malaise: Secondary | ICD-10-CM

## 2012-12-11 DIAGNOSIS — R630 Anorexia: Secondary | ICD-10-CM

## 2012-12-11 NOTE — Patient Instructions (Addendum)
We will call you with your lab results. If you don't here from [place] in about a week then please give [place] a call at [phone]. Work on getting some Boost and drink at least one a day

## 2012-12-11 NOTE — Progress Notes (Signed)
Subjective:    Patient ID: Mandy Shaw, female    DOB: 1961-08-21, 52 y.o.   MRN: [id number]  HPI Not interest in eating for for 4 weeks.  No abdominal pain or nausea or vomiting.  NO fever.  Had bronchitis in January. She feels the symptoms did resolve. Extremely low energy level. She describes herself as "lethargic" .  No sweats. No blood in stool no constipation.  No swollen lumps or LN.  Easy bruising but that isn't new. She wonders if it could be her thyroid. Still on thyroid daily. No urianry sxs or URI sxs.  She has not eaten today. She had 3 chx nuggets yesterday. She is only drinking water. She has not tried any juice or Gatorade et Karie Soda. She denies any recent changes to her medications including her psychiatric medications. She does have a history bipolar borderline, personality disorders, PTSD.      Review of Systems  BP 107/66  Pulse 98  Ht 5\' 6"  (1.676 m)  Wt 172 lb (78.019 kg)  BMI 27.77 kg/m2    Allergies  Allergen Reactions  . Neurontin (Gabapentin)   . Penicillins     REACTION: Anaphalatic  . Sulfonamide Derivatives     REACTION: Anaphalatic    Past Medical History  Diagnosis Date  . COPD (chronic obstructive pulmonary disease)   . Thyroid disease   . Insomnia   . GERD (gastroesophageal reflux disease)   . Arthritis   . Bipolar 1 disorder   . Personality disorder     Past Surgical History  Procedure Laterality Date  . Cesarean section    . Cholecystectomy    . Thyroidectomy    . Leep    . Incontinence surgery    . Eye surgery    . Induced abortion    . Cardiac surgery      flap oh heart removed  . Laparotomy      exploratory for pain  . Condyloma excision/fulguration      History   Social History  . Marital Status: Married    Spouse Name: N/A    Number of Children: N/A  . Years of Education: N/A   Occupational History  . Not on file.   Social History Main Topics  . Smoking status: Current Every Day Smoker -- 0.50 packs/day for  40 years    Types: Cigarettes  . Smokeless tobacco: Not on file  . Alcohol Use: Yes     Comment: socially  . Drug Use: No  . Sexually Active: Yes -- Female partner(s)     Comment: married, 1 daughter, 1  1/2 yrs college, self-employed, no reg exercise, 3 caffeine drinks daily.   Other Topics Concern  . Not on file   Social History Narrative  . No narrative on file    Family History  Problem Relation Age of Onset  . Arthritis      family history  . Stroke      family history  . Depression      siblings  . Alcohol abuse      siblings  . Alcohol abuse      father's family    Outpatient Encounter Prescriptions as of 12/11/2012  Medication Sig Dispense Refill  . acyclovir ointment (ZOVIRAX) 5 % Apply topically 3 (three) times daily.  15 g  2  . albuterol (PROVENTIL HFA;VENTOLIN HFA) 108 (90 BASE) MCG/ACT inhaler Inhale 2 puffs into the lungs every 6 (six) hours as needed for wheezing or  shortness of breath.  1 Inhaler  1  . ALPRAZolam (XANAX) 1 MG tablet Take 1 mg by mouth 3 (three) times daily.      . Calcium Carbonate (CALCIUM 500 PO) Take by mouth 2 (two) times daily at 10 AM and 5 PM.      . Casanthranol-Docusate Sodium 30-100 MG CAPS Take by mouth at bedtime. Take 2 pills at bedtime.       . DULoxetine (CYMBALTA) 60 MG capsule Take 60 mg by mouth 2 (two) times daily.      Marland Kitchen HYDROcodone-acetaminophen (NORCO) 7.5-325 MG per tablet Take 1 tablet by mouth every 4 (four) hours as needed.      Marland Kitchen levothyroxine (SYNTHROID, LEVOTHROID) 137 MCG tablet take 1 tablet by mouth every 24 hours  30 tablet  3  . LORazepam (ATIVAN) 0.5 MG tablet Take 1 mg by mouth 3 (three) times daily.       Marland Kitchen LOVAZA 1 G capsule take 4 capsules by mouth once daily  120 capsule  8  . loxapine (LOXITANE) 10 MG capsule Take 10 mg by mouth. Take two tablets  At night      . mometasone-formoterol (DULERA) 200-5 MCG/ACT AERO Inhale 2 puffs into the lungs 2 (two) times daily.  13 g  2  . Multiple Vitamins-Minerals  (ANTIOXIDANT FORMULA SG) capsule Take 1 capsule by mouth daily.        Marland Kitchen omeprazole-sodium bicarbonate (ZEGERID) 40-1100 MG per capsule take 1 capsule by mouth twice a day  60 capsule  2  . ranitidine (ZANTAC) 300 MG tablet take 1 tablet by mouth at bedtime  30 tablet  3  . simvastatin (ZOCOR) 20 MG tablet take 1 tablet by mouth daily  30 tablet  3  . topiramate (TOPAMAX) 50 MG tablet Take 50 mg by mouth 2 (two) times daily.      Marland Kitchen zolpidem (AMBIEN) 10 MG tablet Take 10 mg by mouth at bedtime as needed. 1 1/2 at bedtime as needed      . [DISCONTINUED] Diclofenac Sodium CR 100 MG 24 hr tablet Take 100 mg by mouth daily.      . [DISCONTINUED] doxycycline (VIBRA-TABS) 100 MG tablet Take 1 tablet (100 mg total) by mouth 2 (two) times daily.  20 tablet  0  . [DISCONTINUED] predniSONE (DELTASONE) 20 MG tablet Take 2 tablets (40 mg total) by mouth daily.  10 tablet  0  . [DISCONTINUED] tizanidine (ZANAFLEX) 2 MG capsule Take 2 mg by mouth 3 (three) times daily.       Facility-Administered Encounter Medications as of 12/11/2012  Medication Dose Route Frequency Provider Last Rate Last Dose  . albuterol (PROVENTIL) (2.5 MG/3ML) 0.083% nebulizer solution 2.5 mg  2.5 mg Nebulization Q6H PRN Agapito Games, MD   2.5 mg at 12/13/11 1004          Objective:   Physical Exam  Constitutional: She is oriented to person, place, and time. She appears well-developed and well-nourished.  HENT:  Head: Normocephalic and atraumatic.  Right Ear: External ear normal.  Left Ear: External ear normal.  Nose: Nose normal.  Mouth/Throat: Oropharynx is clear and moist.  TMs and canals are clear.   Eyes: Conjunctivae and EOM are normal. Pupils are equal, round, and reactive to light.  Neck: Neck supple. No thyromegaly present.  Cardiovascular: Normal rate, regular rhythm and normal heart sounds.   Pulmonary/Chest: Effort normal and breath sounds normal. She has no wheezes.  Abdominal: Soft. Bowel sounds are  normal. She exhibits no distension and no mass. There is no tenderness. There is no rebound and no guarding.  Musculoskeletal: She exhibits no edema.  Lymphadenopathy:    She has no cervical adenopathy.  Neurological: She is alert and oriented to person, place, and time.  Skin: Skin is warm and dry.  Psychiatric: She has a normal mood and affect.          Assessment & Plan:  Anorexia-unclear etiology at this point. Would like to check a CBC to evaluate for anemia and for possible infection. I would also like to check her electrolytes to make sure that they're not out of whack. She's eating so little that would not surprise me if her ketones are elevated. Encouraged her to try to increase fluids that have some electrolyte and calories. I recommend getting boost at her local drug store and at least drinking one a day. She can sit on it and back average or if needed but I want her to get an entire bottle today if possible. She is not having any abdominal discomfort or pain which is reassuring. She is currently on Zantac.  Fatigue-could be related to the anorexia. Consider this could be a virus versus change in her need for thyroid medication. We'll recheck her TSH today.

## 2012-12-12 ENCOUNTER — Telehealth: Payer: Self-pay | Admitting: *Deleted

## 2012-12-12 ENCOUNTER — Other Ambulatory Visit: Payer: Self-pay | Admitting: Family Medicine

## 2012-12-12 DIAGNOSIS — F17200 Nicotine dependence, unspecified, uncomplicated: Secondary | ICD-10-CM

## 2012-12-12 DIAGNOSIS — R5381 Other malaise: Secondary | ICD-10-CM

## 2012-12-12 DIAGNOSIS — R630 Anorexia: Secondary | ICD-10-CM

## 2012-12-12 LAB — COMPLETE METABOLIC PANEL WITH GFR
ALT: 17 U/L (ref 0–35)
Albumin: 4.6 g/dL (ref 3.5–5.2)
CO2: 23 mEq/L (ref 19–32)
Calcium: 10.1 mg/dL (ref 8.4–10.5)
Chloride: 103 mEq/L (ref 96–112)
GFR, Est African American: 41 mL/min — ABNORMAL LOW
Potassium: 4 mEq/L (ref 3.5–5.3)
Sodium: 136 mEq/L (ref 135–145)
Total Protein: 6.8 g/dL (ref 6.0–8.3)

## 2012-12-12 LAB — CBC
Hemoglobin: 16.5 g/dL — ABNORMAL HIGH (ref 12.0–15.0)
MCH: 31.3 pg (ref 26.0–34.0)
MCHC: 33.7 g/dL (ref 30.0–36.0)
MCV: 92.8 fL (ref 78.0–100.0)
Platelets: 189 10*3/uL (ref 150–400)
RBC: 5.28 MIL/uL — ABNORMAL HIGH (ref 3.87–5.11)

## 2012-12-12 NOTE — Telephone Encounter (Signed)
Message copied by Edilia Bo on Tue Dec 12, 2012 10:39 AM ------      Message from: Nani Gasser D      Created: Tue Dec 12, 2012  8:12 AM       Call patient: Her white count is elevated suggesting that she may have a low-grade infection. Though with some mild dehydration this can occur as well. I would like to do a urinalysis as well as a chest x-ray. I will place orders for both. ------

## 2012-12-13 ENCOUNTER — Ambulatory Visit (INDEPENDENT_AMBULATORY_CARE_PROVIDER_SITE_OTHER): Payer: BC Managed Care – PPO

## 2012-12-13 ENCOUNTER — Ambulatory Visit (INDEPENDENT_AMBULATORY_CARE_PROVIDER_SITE_OTHER): Payer: BC Managed Care – PPO | Admitting: Physician Assistant

## 2012-12-13 VITALS — BP 99/69 | HR 104 | Temp 97.4°F | Wt 171.0 lb

## 2012-12-13 DIAGNOSIS — D72829 Elevated white blood cell count, unspecified: Secondary | ICD-10-CM

## 2012-12-13 DIAGNOSIS — F17200 Nicotine dependence, unspecified, uncomplicated: Secondary | ICD-10-CM

## 2012-12-13 DIAGNOSIS — R630 Anorexia: Secondary | ICD-10-CM

## 2012-12-13 DIAGNOSIS — R319 Hematuria, unspecified: Secondary | ICD-10-CM

## 2012-12-13 LAB — POCT URINALYSIS DIPSTICK
Bilirubin, UA: NEGATIVE
Glucose, UA: NEGATIVE
Spec Grav, UA: <=1.005
pH, UA: 7

## 2012-12-13 MED ORDER — CIPROFLOXACIN HCL 500 MG PO TABS: 500.0000 mg | ORAL_TABLET | Freq: Two times a day (BID) | ORAL | Status: DC

## 2012-12-13 NOTE — Progress Notes (Signed)
  Subjective:    Patient ID: Mandy Shaw, female    DOB: [birth year], 52 y.o.   MRN: [id number]  HPI Urine collected by nurse. Dr. Linford Arnold wanted order due to WBC being slightly elevated.    Review of Systems     Objective:   Physical Exam        Assessment & Plan:  Elevated WBC- UA was positive for trace blood and nitrates. Will go ahead and treat with Cipro for 3 days. Will send for culture. Tandy Gaw PA-C

## 2012-12-14 LAB — URINALYSIS
Glucose, UA: NEGATIVE mg/dL
Hgb urine dipstick: NEGATIVE
Ketones, ur: NEGATIVE mg/dL
Protein, ur: NEGATIVE mg/dL
pH: 7.5 (ref 5.0–8.0)

## 2012-12-17 ENCOUNTER — Other Ambulatory Visit: Payer: Self-pay | Admitting: Physician Assistant

## 2012-12-17 LAB — URINE CULTURE: Colony Count: >=100000

## 2012-12-17 NOTE — Progress Notes (Signed)
Dr. Linford Arnold, C and S came back on urine culture for your pt. I had previously given cipro but now that sensitivities came back show that Levaquin or macrobid would work. I just wanted to run this by you because both have side effects. Creatine Clearance is 49 and on a lot of medication that interfere with Levaquin such as QT prolongation. I haven't heard if pt feels better after treatment with cipro. What is your suggestion?

## 2012-12-18 MED ORDER — NITROFURANTOIN MONOHYD MACRO 100 MG PO CAPS: 100.0000 mg | ORAL_CAPSULE | Freq: Two times a day (BID) | ORAL | Status: DC

## 2012-12-18 NOTE — Progress Notes (Signed)
Sent over macrobid. Should be ok since not takign as maintenance therapy.

## 2013-01-02 ENCOUNTER — Encounter: Payer: Self-pay | Admitting: Family Medicine

## 2013-01-02 ENCOUNTER — Ambulatory Visit (INDEPENDENT_AMBULATORY_CARE_PROVIDER_SITE_OTHER): Payer: BC Managed Care – PPO | Admitting: Family Medicine

## 2013-01-02 VITALS — BP 110/80 | Temp 97.7°F | Wt 161.0 lb

## 2013-01-02 DIAGNOSIS — J441 Chronic obstructive pulmonary disease with (acute) exacerbation: Secondary | ICD-10-CM

## 2013-01-02 MED ORDER — PREDNISONE 20 MG PO TABS: ORAL_TABLET | ORAL | Status: AC

## 2013-01-02 MED ORDER — ALBUTEROL SULFATE HFA 108 (90 BASE) MCG/ACT IN AERS: 2.0000 | INHALATION_SPRAY | RESPIRATORY_TRACT | Status: DC | PRN

## 2013-01-02 MED ORDER — DOXYCYCLINE HYCLATE 100 MG PO TABS: ORAL_TABLET | ORAL | Status: AC

## 2013-01-02 NOTE — Progress Notes (Signed)
CC: Mandy Shaw is a 52 y.o. female is here for URI   Subjective: HPI:  Patient complains of cough, fatigue, runny nose. Cough has been present for 5-7 days, worsening on a daily basis. Worst first thing in the morning of moderate severity and remains mild to moderate throughout the day. Has tried Claritin without improvement no other interventions. Continues to smoke. Cough is becoming more and more productive of a thick sputum, unknown color or if blood is present. Complaints of chest tightness only with deep breathing not noticed with exertion. Coughing frequently causes her to feel like she is going to throw up.  Fatigue is described as moderate severity and has been present since onset of cough. Runny nose is clear nasal discharge without facial pain dizziness nor ocular complaints.  Patient denies fevers, chills, abdominal pain, chest pain (other than above), irregular heartbeat, back pain, orthopnea, shortness of breath, peripheral edema, rashes, myalgias, joint pain.   Review Of Systems Outlined In HPI  Past Medical History  Diagnosis Date  . COPD (chronic obstructive pulmonary disease)   . Thyroid disease   . Insomnia   . GERD (gastroesophageal reflux disease)   . Arthritis   . Bipolar 1 disorder   . Personality disorder      Family History  Problem Relation Age of Onset  . Arthritis      family history  . Stroke      family history  . Depression      siblings  . Alcohol abuse      siblings  . Alcohol abuse      father's family     History  Substance Use Topics  . Smoking status: Current Every Day Smoker -- 0.50 packs/day for 40 years    Types: Cigarettes  . Smokeless tobacco: Not on file  . Alcohol Use: Yes     Comment: socially     Objective: Filed Vitals:   01/02/13 1312  BP: 110/80  Temp: 97.7 F (36.5 C)    General: Alert and Oriented, No Acute Distress HEENT: Pupils equal, round, reactive to light. Conjunctivae clear.  External ears unremarkable,  canals clear with intact TMs with appropriate landmarks.  Middle ear appears open without effusion. Moderate purulent discharge in nares with erythematous inferior turbinates..  Moist mucous membranes, pharynx without inflammation nor lesions.  Neck supple without palpable lymphadenopathy nor abnormal masses. Lungs: Moderate central rhonchi with end expiratory wheezing in the periphery without rails nor signs of consolidation Cardiac: Regular rate and rhythm. Normal S1/S2.  No murmurs, rubs, nor gallops.   Extremities: No peripheral edema.  Strong peripheral pulses.  Mental Status: No depression, anxiety, nor agitation. Skin: Warm and dry.  Assessment & Plan: Serrina was seen today for uri.  Diagnoses and associated orders for this visit:  COPD exacerbation - doxycycline (VIBRA-TABS) 100 MG tablet; One by mouth twice a day for ten days. - predniSONE (DELTASONE) 20 MG tablet; Three tabs at once daily for five days. - albuterol (PROVENTIL HFA;VENTOLIN HFA) 108 (90 BASE) MCG/ACT inhaler; Inhale 2 puffs into the lungs every 4 (four) hours as needed for wheezing.  Other Orders - Vortioxetine HBr (BRINTELLIX) 10 MG TABS; Take by mouth. - primidone (MYSOLINE) 50 MG tablet; Take 50 mg by mouth 2 (two) times daily.    COPD exacerbation: Start doxycycline and prednisone, scheduled albuterol 2 puffs every 4 hours for the next 48 hours and then every 6 hours as needed. Encouraged to stop smoking for general health and  wellness. Followup with PCP in one week for recheck.  Return in about 1 week (around 01/09/2013).

## 2013-01-15 ENCOUNTER — Other Ambulatory Visit: Payer: Self-pay | Admitting: Family Medicine

## 2013-02-04 ENCOUNTER — Other Ambulatory Visit: Payer: Self-pay | Admitting: Family Medicine

## 2013-02-05 ENCOUNTER — Other Ambulatory Visit: Payer: Self-pay | Admitting: Family Medicine

## 2013-02-05 MED ORDER — OMEPRAZOLE 40 MG PO CPDR: 40.0000 mg | DELAYED_RELEASE_CAPSULE | Freq: Two times a day (BID) | ORAL | Status: DC

## 2013-02-07 ENCOUNTER — Other Ambulatory Visit: Payer: Self-pay | Admitting: *Deleted

## 2013-02-07 MED ORDER — SIMVASTATIN 20 MG PO TABS: ORAL_TABLET | ORAL | Status: DC

## 2013-02-09 ENCOUNTER — Encounter: Payer: Self-pay | Admitting: Family Medicine

## 2013-02-09 ENCOUNTER — Ambulatory Visit (INDEPENDENT_AMBULATORY_CARE_PROVIDER_SITE_OTHER): Payer: BC Managed Care – PPO | Admitting: Family Medicine

## 2013-02-09 VITALS — BP 96/65 | HR 120 | Wt 151.0 lb

## 2013-02-09 DIAGNOSIS — N390 Urinary tract infection, site not specified: Secondary | ICD-10-CM

## 2013-02-09 DIAGNOSIS — R300 Dysuria: Secondary | ICD-10-CM

## 2013-02-09 LAB — POCT URINALYSIS DIPSTICK
Nitrite, UA: NEGATIVE
Protein, UA: NEGATIVE
Spec Grav, UA: <=1.005
Urobilinogen, UA: 0.2

## 2013-02-09 MED ORDER — LEVOFLOXACIN 250 MG PO TABS: 250.0000 mg | ORAL_TABLET | Freq: Every day | ORAL | Status: AC

## 2013-02-09 NOTE — Progress Notes (Signed)
CC: Mandy Shaw is a 52 y.o. female is here for Dysuria   Subjective: HPI:   Patient reports acute onset dysuria and urinary urgency with frequency upon awakening today. Symptoms persisted since onset, moderate in severity, nothing makes better or worse, no interventions as of yet. Denies fevers, chills, flank pain, nausea, vomiting, night sweats, vaginal discharge, pelvic pain. Pain is localized to the urethra. She had a urinary tract infection found in late March she never took Cipro was prescribed Macrobid only took 4 doses before having swelling of her throat.   Review Of Systems Outlined In HPI  Past Medical History  Diagnosis Date  . COPD (chronic obstructive pulmonary disease)   . Thyroid disease   . Insomnia   . GERD (gastroesophageal reflux disease)   . Arthritis   . Bipolar 1 disorder   . Personality disorder      Family History  Problem Relation Age of Onset  . Arthritis      family history  . Stroke      family history  . Depression      siblings  . Alcohol abuse      siblings  . Alcohol abuse      father's family     History  Substance Use Topics  . Smoking status: Current Every Day Smoker -- 0.50 packs/day for 40 years    Types: Cigarettes  . Smokeless tobacco: Not on file  . Alcohol Use: Yes     Comment: socially     Objective: Filed Vitals:   02/09/13 1427  BP: 96/65  Pulse: 120    Vital signs reviewed. General: Alert and Oriented, No Acute Distress HEENT: Pupils equal, round, reactive to light. Conjunctivae clear.  External ears unremarkable.  Moist mucous membranes. Lungs: Clear and comfortable work of breathing, speaking in full sentences without accessory muscle use. Cardiac: Regular rate and rhythm.  Neuro: CN II-XII grossly intact, gait normal. Extremities: No peripheral edema.  Strong peripheral pulses. No CVA tenderness  Mental Status: No depression, anxiety, nor agitation. Logical though process. Skin: Warm and  dry.  Assessment & Plan: Bruce was seen today for dysuria.  Diagnoses and associated orders for this visit:  Dysuria - Urinalysis Dipstick - Urine Culture  UTI (urinary tract infection) - levofloxacin (LEVAQUIN) 250 MG tablet; Take 1 tablet (250 mg total) by mouth daily.    Symptoms suggestive of UTI and based on sensitivity data and history of not taking complete antibiotic regimen possibility of persistent enteric coccus infection sensitive to levofloxacin. We'll prescribe five-day regimen and collect urine for culture.  Return if symptoms worsen or fail to improve.

## 2013-02-15 ENCOUNTER — Other Ambulatory Visit: Payer: Self-pay | Admitting: Family Medicine

## 2013-02-24 DIAGNOSIS — N318 Other neuromuscular dysfunction of bladder: Secondary | ICD-10-CM | POA: Insufficient documentation

## 2013-02-24 DIAGNOSIS — J449 Chronic obstructive pulmonary disease, unspecified: Secondary | ICD-10-CM | POA: Insufficient documentation

## 2013-02-24 DIAGNOSIS — F609 Personality disorder, unspecified: Secondary | ICD-10-CM | POA: Insufficient documentation

## 2013-03-26 ENCOUNTER — Other Ambulatory Visit: Payer: Self-pay | Admitting: Family Medicine

## 2013-04-16 DIAGNOSIS — R339 Retention of urine, unspecified: Secondary | ICD-10-CM | POA: Insufficient documentation

## 2013-04-16 DIAGNOSIS — N3946 Mixed incontinence: Secondary | ICD-10-CM | POA: Insufficient documentation

## 2013-04-16 DIAGNOSIS — T83721A Exposure of implanted vaginal mesh and other prosthetic materials into vagina, initial encounter: Secondary | ICD-10-CM | POA: Insufficient documentation

## 2013-04-22 ENCOUNTER — Other Ambulatory Visit: Payer: Self-pay | Admitting: Family Medicine

## 2013-05-25 ENCOUNTER — Telehealth: Payer: Self-pay | Admitting: Physician Assistant

## 2013-05-25 NOTE — Telephone Encounter (Signed)
Via insurance notification colonoscopy has not been updated in system. Have you had colonoscopy? Can I refer for you to get one?

## 2013-05-28 ENCOUNTER — Other Ambulatory Visit: Payer: Self-pay | Admitting: Physician Assistant

## 2013-05-28 DIAGNOSIS — Z1211 Encounter for screening for malignant neoplasm of colon: Secondary | ICD-10-CM

## 2013-05-28 NOTE — Telephone Encounter (Signed)
Done

## 2013-05-28 NOTE — Telephone Encounter (Signed)
Pt states that she has never had a colonoscopy.  She is ok with a referral for one.

## 2013-06-19 ENCOUNTER — Other Ambulatory Visit: Payer: Self-pay | Admitting: Family Medicine

## 2013-06-25 ENCOUNTER — Other Ambulatory Visit: Payer: Self-pay | Admitting: Family Medicine

## 2013-07-05 ENCOUNTER — Encounter: Payer: Self-pay | Admitting: Family Medicine

## 2013-07-05 ENCOUNTER — Ambulatory Visit (INDEPENDENT_AMBULATORY_CARE_PROVIDER_SITE_OTHER): Payer: BC Managed Care – PPO | Admitting: Family Medicine

## 2013-07-05 VITALS — BP 103/71 | HR 91 | Wt 142.0 lb

## 2013-07-05 DIAGNOSIS — E785 Hyperlipidemia, unspecified: Secondary | ICD-10-CM

## 2013-07-05 DIAGNOSIS — E039 Hypothyroidism, unspecified: Secondary | ICD-10-CM | POA: Insufficient documentation

## 2013-07-05 DIAGNOSIS — J4489 Other specified chronic obstructive pulmonary disease: Secondary | ICD-10-CM

## 2013-07-05 DIAGNOSIS — J449 Chronic obstructive pulmonary disease, unspecified: Secondary | ICD-10-CM

## 2013-07-05 MED ORDER — UMECLIDINIUM-VILANTEROL 62.5-25 MCG/INH IN AEPB: 1.0000 | INHALATION_SPRAY | Freq: Every day | RESPIRATORY_TRACT | Status: DC
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
## 2013-07-20 ENCOUNTER — Other Ambulatory Visit: Payer: Self-pay | Admitting: Family Medicine

## 2013-07-27 ENCOUNTER — Encounter: Payer: Self-pay | Admitting: Family Medicine

## 2013-07-27 DIAGNOSIS — N183 Chronic kidney disease, stage 3 unspecified: Secondary | ICD-10-CM | POA: Insufficient documentation

## 2013-07-27 LAB — COMPLETE METABOLIC PANEL WITH GFR
Albumin: 4.2 g/dL (ref 3.5–5.2)
BUN: 13 mg/dL (ref 6–23)
CO2: 25 mEq/L (ref 19–32)
Calcium: 9.4 mg/dL (ref 8.4–10.5)
Chloride: 111 mEq/L (ref 96–112)
GFR, Est African American: 53 mL/min — ABNORMAL LOW
GFR, Est Non African American: 46 mL/min — ABNORMAL LOW
Glucose, Bld: 93 mg/dL (ref 70–99)
Potassium: 4.3 mEq/L (ref 3.5–5.3)
Sodium: 144 mEq/L (ref 135–145)
Total Protein: 6.8 g/dL (ref 6.0–8.3)

## 2013-07-27 LAB — LIPID PANEL
HDL: 37 mg/dL — ABNORMAL LOW (ref >39)
LDL Cholesterol: 98 mg/dL (ref 0–99)
Total CHOL/HDL Ratio: 4.4 Ratio
Triglycerides: 135 mg/dL (ref <150)
VLDL: 27 mg/dL (ref 0–40)

## 2013-07-27 LAB — TSH: TSH: 0.612 u[IU]/mL (ref 0.350–4.500)

## 2013-07-30 ENCOUNTER — Other Ambulatory Visit: Payer: Self-pay | Admitting: Family Medicine

## 2013-08-02 ENCOUNTER — Ambulatory Visit (INDEPENDENT_AMBULATORY_CARE_PROVIDER_SITE_OTHER): Payer: BC Managed Care – PPO | Admitting: Family Medicine

## 2013-08-02 ENCOUNTER — Ambulatory Visit: Payer: BC Managed Care – PPO | Admitting: Family Medicine

## 2013-08-02 ENCOUNTER — Encounter: Payer: Self-pay | Admitting: Family Medicine

## 2013-08-02 VITALS — BP 125/82 | HR 103 | Temp 98.6°F | Wt 139.0 lb

## 2013-08-02 DIAGNOSIS — Z23 Encounter for immunization: Secondary | ICD-10-CM

## 2013-08-02 DIAGNOSIS — J449 Chronic obstructive pulmonary disease, unspecified: Secondary | ICD-10-CM

## 2013-08-02 DIAGNOSIS — R21 Rash and other nonspecific skin eruption: Secondary | ICD-10-CM

## 2013-08-02 DIAGNOSIS — G4700 Insomnia, unspecified: Secondary | ICD-10-CM

## 2013-08-02 MED ORDER — TRAZODONE HCL 50 MG PO TABS: 25.0000 mg | ORAL_TABLET | Freq: Every day | ORAL | Status: DC

## 2013-08-02 MED ORDER — BECLOMETHASONE DIPROPIONATE 40 MCG/ACT IN AERS: 1.0000 | INHALATION_SPRAY | Freq: Two times a day (BID) | RESPIRATORY_TRACT | Status: DC

## 2013-08-02 NOTE — Addendum Note (Signed)
Addended by: Deno Etienne on: 08/02/2013 03:54 PM   Modules accepted: Orders

## 2013-08-02 NOTE — Progress Notes (Signed)
Subjective:    Patient ID: Mandy Shaw, female    DOB: 1961-06-19, 52 y.o.   MRN: [id number]  HPI COPD - F/U 1 month on Anoro. Using her rescue inhaler more than before. She has been using up to 4 times a day. No recent upper respiratory infection. No recent wheezing. We have switched her off of tumor which did have an inhaled corticosteroid.  Knot on dorsum of right hands. Started in June (6 months) but is getting larger. Says looks more bumpy at time.  No itching ot pain.  Has been trying to moiturize it.    Her insurance will no longer pay for her ambien. She was taking 15 mg daily for years.   It will be $95 per month.  She ill need to switch to trazodone.   Review of Systems BP 125/82  Pulse 103  Temp(Src) 98.6 F (37 C)  Wt 139 lb (63.05 kg)  SpO2 95%    Allergies  Allergen Reactions  . Macrodantin [Nitrofurantoin Macrocrystal] Itching  . Neurontin [Gabapentin]   . Penicillins     REACTION: Anaphalatic  . Sulfonamide Derivatives     REACTION: Anaphalatic    Past Medical History  Diagnosis Date  . COPD (chronic obstructive pulmonary disease)   . Thyroid disease   . Insomnia   . GERD (gastroesophageal reflux disease)   . Arthritis   . Bipolar 1 disorder   . Personality disorder     Past Surgical History  Procedure Laterality Date  . Cesarean section    . Cholecystectomy    . Thyroidectomy    . Leep    . Incontinence surgery    . Eye surgery    . Induced abortion    . Cardiac surgery      flap oh heart removed  . Laparotomy      exploratory for pain  . Condyloma excision/fulguration      History   Social History  . Marital Status: Married    Spouse Name: N/A    Number of Children: N/A  . Years of Education: N/A   Occupational History  . Not on file.   Social History Main Topics  . Smoking status: Current Every Day Smoker -- 0.50 packs/day for 40 years    Types: Cigarettes  . Smokeless tobacco: Not on file  . Alcohol Use: Yes     Comment:  socially  . Drug Use: No  . Sexual Activity: Yes    Partners: Male     Comment: married, 1 daughter, 1  1/2 yrs college, self-employed, no reg exercise, 3 caffeine drinks daily.   Other Topics Concern  . Not on file   Social History Narrative  . No narrative on file    Family History  Problem Relation Age of Onset  . Arthritis      family history  . Stroke      family history  . Depression      siblings  . Alcohol abuse      siblings  . Alcohol abuse      father's family    Outpatient Encounter Prescriptions as of 08/02/2013  Medication Sig  . ALPRAZolam (XANAX) 1 MG tablet Take 1 mg by mouth 3 (three) times daily.  . Calcium Carbonate (CALCIUM 500 PO) Take by mouth 2 (two) times daily at 10 AM and 5 PM.  . Casanthranol-Docusate Sodium 30-100 MG CAPS Take by mouth at bedtime. Take 2 pills at bedtime.   . DULoxetine (CYMBALTA)  60 MG capsule Take 60 mg by mouth 2 (two) times daily.  Marland Kitchen HYDROcodone-acetaminophen (NORCO) 7.5-325 MG per tablet Take 1 tablet by mouth every 4 (four) hours as needed.  Marland Kitchen levothyroxine (SYNTHROID, LEVOTHROID) 137 MCG tablet take 1 tablet by mouth every 24 hours  . LOVAZA 1 G capsule take 4 capsules by mouth once daily  . loxapine (LOXITANE) 10 MG capsule Take 10 mg by mouth. Take two tablets  At night  . Multiple Vitamins-Minerals (ANTIOXIDANT FORMULA SG) capsule Take 1 capsule by mouth daily.    Marland Kitchen omeprazole (PRILOSEC) 40 MG capsule take 1 capsule by mouth twice a day  . omeprazole-sodium bicarbonate (ZEGERID) 40-1100 MG per capsule take 1 capsule by mouth twice a day  . primidone (MYSOLINE) 50 MG tablet Take 50 mg by mouth 2 (two) times daily.  . ranitidine (ZANTAC) 300 MG tablet take 1 tablet by mouth at bedtime  . simvastatin (ZOCOR) 20 MG tablet take 1 tablet by mouth once daily  . topiramate (TOPAMAX) 50 MG tablet Take 50 mg by mouth 2 (two) times daily.  Marland Kitchen Umeclidinium-Vilanterol (ANORO ELLIPTA) 62.5-25 MCG/INH AEPB Inhale 1 puff into the  lungs daily.  . Vortioxetine HBr (BRINTELLIX) 10 MG TABS Take by mouth.  . [DISCONTINUED] zolpidem (AMBIEN) 10 MG tablet Take 10 mg by mouth at bedtime as needed. 1 1/2 at bedtime as needed  . beclomethasone (QVAR) 40 MCG/ACT inhaler Inhale 1 puff into the lungs 2 (two) times daily.  . traZODone (DESYREL) 50 MG tablet Take 0.5-2 tablets (25-100 mg total) by mouth at bedtime.          Objective:   Physical Exam  Constitutional: She is oriented to person, place, and time. She appears well-developed and well-nourished.  HENT:  Head: Normocephalic and atraumatic.  Cardiovascular: Normal rate, regular rhythm and normal heart sounds.   Pulmonary/Chest: Effort normal and breath sounds normal.  Neurological: She is alert and oriented to person, place, and time.  Skin: Skin is warm and dry.  On the dorsum of the left hand she has an area that is round, approximately 1/2 cm in size with the skin is a little bit thickened. No erythema. There some dry scale on the service. No distinct papules. The skin.  Psychiatric: She has a normal mood and affect. Her behavior is normal.          Assessment & Plan:  COPD- will add inhaled corticosteroid, QVAR 1 puff BID. Continue anora. F/U in 6 weeks. Work on smoking cessation.   Insomnia - will switch to trazodone. Can taper up to 2 tabs. F/U in 6 weeks for insomnia.   Rash on dorsum of the left hand - skin scraping performed. Send for KOH. Will call patient with results when available. If negative we'll treat with topical steroid if not significantly improved or resolved within 2 weeks then consider biopsy.

## 2013-08-02 NOTE — Addendum Note (Signed)
Addended by: Deno Etienne on: 08/02/2013 03:03 PM   Modules accepted: Orders, SmartSet

## 2013-08-02 NOTE — Patient Instructions (Signed)
Please start Qvar. One puff twice a day. Continue the Anoro. We will call you with the results on the skin scraping either tomorrow or Monday.

## 2013-08-03 ENCOUNTER — Other Ambulatory Visit: Payer: Self-pay | Admitting: Family Medicine

## 2013-08-03 MED ORDER — TRIAMCINOLONE ACETONIDE 0.5 % EX OINT: 1.0000 "application " | TOPICAL_OINTMENT | Freq: Two times a day (BID) | CUTANEOUS | Status: DC

## 2013-08-18 ENCOUNTER — Other Ambulatory Visit: Payer: Self-pay | Admitting: Family Medicine

## 2013-08-27 ENCOUNTER — Other Ambulatory Visit: Payer: Self-pay | Admitting: Family Medicine

## 2013-09-20 ENCOUNTER — Other Ambulatory Visit: Payer: Self-pay | Admitting: Family Medicine

## 2013-09-20 HISTORY — PX: SHOULDER SURGERY: SHX246

## 2013-09-26 HISTORY — PX: OTHER SURGICAL HISTORY: SHX169

## 2013-10-02 ENCOUNTER — Other Ambulatory Visit: Payer: Self-pay | Admitting: Family Medicine

## 2013-10-29 ENCOUNTER — Other Ambulatory Visit: Payer: Self-pay | Admitting: Family Medicine

## 2013-11-19 ENCOUNTER — Other Ambulatory Visit: Payer: Self-pay | Admitting: Family Medicine

## 2013-12-19 ENCOUNTER — Other Ambulatory Visit: Payer: Self-pay | Admitting: Family Medicine

## 2013-12-25 ENCOUNTER — Other Ambulatory Visit: Payer: Self-pay | Admitting: Family Medicine

## 2014-01-09 HISTORY — PX: OTHER SURGICAL HISTORY: SHX169

## 2014-01-18 ENCOUNTER — Other Ambulatory Visit: Payer: Self-pay | Admitting: Family Medicine

## 2014-01-21 ENCOUNTER — Other Ambulatory Visit: Payer: Self-pay | Admitting: Family Medicine

## 2014-03-03 ENCOUNTER — Other Ambulatory Visit: Payer: Self-pay | Admitting: Family Medicine

## 2014-03-11 ENCOUNTER — Other Ambulatory Visit: Payer: Self-pay | Admitting: Family Medicine

## 2014-03-21 ENCOUNTER — Other Ambulatory Visit: Payer: Self-pay | Admitting: Family Medicine

## 2014-04-03 ENCOUNTER — Other Ambulatory Visit: Payer: Self-pay | Admitting: Family Medicine

## 2014-04-09 ENCOUNTER — Encounter: Payer: Self-pay | Admitting: Family Medicine

## 2014-04-09 ENCOUNTER — Ambulatory Visit (INDEPENDENT_AMBULATORY_CARE_PROVIDER_SITE_OTHER): Payer: BC Managed Care – PPO | Admitting: Family Medicine

## 2014-04-09 VITALS — BP 97/67 | HR 88 | Wt 114.0 lb

## 2014-04-09 DIAGNOSIS — F329 Major depressive disorder, single episode, unspecified: Secondary | ICD-10-CM

## 2014-04-09 DIAGNOSIS — J449 Chronic obstructive pulmonary disease, unspecified: Secondary | ICD-10-CM

## 2014-04-09 DIAGNOSIS — G4700 Insomnia, unspecified: Secondary | ICD-10-CM | POA: Insufficient documentation

## 2014-04-09 DIAGNOSIS — N912 Amenorrhea, unspecified: Secondary | ICD-10-CM

## 2014-04-09 DIAGNOSIS — N183 Chronic kidney disease, stage 3 unspecified: Secondary | ICD-10-CM

## 2014-04-09 DIAGNOSIS — L708 Other acne: Secondary | ICD-10-CM

## 2014-04-09 DIAGNOSIS — F32A Depression, unspecified: Secondary | ICD-10-CM

## 2014-04-09 DIAGNOSIS — F3289 Other specified depressive episodes: Secondary | ICD-10-CM

## 2014-04-09 MED ORDER — TRAZODONE HCL 100 MG PO TABS: 100.0000 mg | ORAL_TABLET | Freq: Every day | ORAL | Status: DC

## 2014-04-09 MED ORDER — METRONIDAZOLE 1 % EX GEL: Freq: Every day | CUTANEOUS | Status: DC

## 2014-04-09 NOTE — Progress Notes (Signed)
   Subjective:    Patient ID: Mandy Shaw, female    DOB: 1960/10/15, 53 y.o.   MRN: [id number]  HPI F/U COPD - she is on Anoro and Qvar. She is having difficulty with the anoro. Says hard time pulling on the device.  The she feels like she is doing well with the Qvar because it actually has an actuatro in it.  F/U insomnia - on trazodone.  Has been using 2 at night for sleep.  She says it is working well and not causing any sedation. She was previously on Ambien but her insurance quit paying for it.  Says her skin on her face is breaking out. Has had more pimples. She denies any new cleaner, face soaps, lotions.  She thinks may be stress.  Had a period in July. Before that she had a period in October.   Review of Systems     Objective:   Physical Exam  Constitutional: She is oriented to person, place, and time. She appears well-developed and well-nourished.  HENT:  Head: Normocephalic and atraumatic.  Cardiovascular: Normal rate, regular rhythm and normal heart sounds.   Pulmonary/Chest: Effort normal and breath sounds normal.  Neurological: She is alert and oriented to person, place, and time.  Skin: Skin is warm and dry.  Fine papular and pustular acne especially around her chin and cheeks and forehead.  Psychiatric: She has a normal mood and affect. Her behavior is normal.          Assessment & Plan:  COPD - stable. Fortunately she has not had any flares since I last saw her. We reviewed technique with the Anoro. I just encourage her to make sure that she's holding up her low to the ground when she inhales on it. But otherwise she did great shock. She is worried about not being affected because she has difficulty breathing and actually think she did a great job overall and reassured her that she is doing a properly. She is also welcome to take a second reason per inhalation if needed to make sure that she completely got the medicine and. Also she is having difficulty getting a deep  breath and we really do need to schedule her for repeat spirometry. The last I can find was from 2013. We'll schedule this in the next one to 2 weeks.  Insomnia - Will change to 100mg .   she's tolerating it well without any side effects. Followup in 6 months.  Acne - will tx with metronidazolge gel.   Irregular periods-we will check hormone levels. Suspect she is entering menopause.  Depression - hasn't been well controlled. She still continues to see her therapist. She says she doesn't feel like it in her head she feels like it's probably her hormones causing her mood to be off. Will check estradiol, progesterone et Ronney Asters. We will also check her thyroid level.  CKD 3-we will recheck her kidney function every 6 months. Unfortunately she has not been in in quite some time. We'll recheck her levels today as well as urine microalbumin to quantify for protein levels.

## 2014-04-10 ENCOUNTER — Telehealth: Payer: Self-pay | Admitting: *Deleted

## 2014-04-10 ENCOUNTER — Encounter: Payer: Self-pay | Admitting: Family Medicine

## 2014-04-10 ENCOUNTER — Other Ambulatory Visit: Payer: Self-pay | Admitting: Family Medicine

## 2014-04-10 DIAGNOSIS — R809 Proteinuria, unspecified: Secondary | ICD-10-CM

## 2014-04-10 DIAGNOSIS — N183 Chronic kidney disease, stage 3 (moderate): Principal | ICD-10-CM

## 2014-04-10 DIAGNOSIS — N1832 Chronic kidney disease, stage 3b: Secondary | ICD-10-CM

## 2014-04-10 LAB — BASIC METABOLIC PANEL WITH GFR
BUN: 21 mg/dL (ref 6–23)
CHLORIDE: 109 meq/L (ref 96–112)
CO2: 23 mEq/L (ref 19–32)
Calcium: 9.9 mg/dL (ref 8.4–10.5)
Creat: 1.07 mg/dL (ref 0.50–1.10)
GFR, Est African American: 68 mL/min
GFR, Est Non African American: 59 mL/min — ABNORMAL LOW
Glucose, Bld: 110 mg/dL — ABNORMAL HIGH (ref 70–99)
Potassium: 4.6 mEq/L (ref 3.5–5.3)
SODIUM: 144 meq/L (ref 135–145)

## 2014-04-10 LAB — MICROALBUMIN / CREATININE URINE RATIO
Creatinine, Urine: 19.1 mg/dL
MICROALB/CREAT RATIO: 437.7 mg/g — AB (ref 0.0–30.0)
Microalb, Ur: 8.36 mg/dL — ABNORMAL HIGH (ref 0.00–1.89)

## 2014-04-10 LAB — TSH: TSH: 1.249 u[IU]/mL (ref 0.350–4.500)

## 2014-04-10 LAB — LUTEINIZING HORMONE: LH: 19 m[IU]/mL

## 2014-04-10 LAB — ESTRADIOL: Estradiol: 14.8 pg/mL

## 2014-04-10 LAB — PROGESTERONE

## 2014-04-10 LAB — FOLLICLE STIMULATING HORMONE: FSH: 30.1 m[IU]/mL

## 2014-04-10 NOTE — Telephone Encounter (Signed)
Spoke w/tanya and she stated that pt has 2 insurances on file and she only has a $10 copay and will call pt to inform her of this.[name] [place]

## 2014-04-10 NOTE — Telephone Encounter (Signed)
Please call the pharmacy and see if maybe there is a different formulation for the metronidazole gel that might be cheaper. This is usually generic in one of the most cost effective treatments for her rosacea.

## 2014-04-10 NOTE — Telephone Encounter (Signed)
Pt stated that gel for face is $195 she wanted to know if something else can be sent.Mandy Shaw

## 2014-04-11 ENCOUNTER — Other Ambulatory Visit: Payer: Self-pay | Admitting: Family Medicine

## 2014-04-12 ENCOUNTER — Ambulatory Visit (INDEPENDENT_AMBULATORY_CARE_PROVIDER_SITE_OTHER): Payer: BC Managed Care – PPO | Admitting: Family Medicine

## 2014-04-12 ENCOUNTER — Encounter: Payer: Self-pay | Admitting: Family Medicine

## 2014-04-12 VITALS — BP 97/67 | HR 88 | Ht 67.0 in | Wt 114.0 lb

## 2014-04-12 DIAGNOSIS — J449 Chronic obstructive pulmonary disease, unspecified: Secondary | ICD-10-CM | POA: Diagnosis not present

## 2014-04-12 DIAGNOSIS — F17200 Nicotine dependence, unspecified, uncomplicated: Secondary | ICD-10-CM

## 2014-04-12 DIAGNOSIS — J984 Other disorders of lung: Secondary | ICD-10-CM

## 2014-04-12 DIAGNOSIS — J4489 Other specified chronic obstructive pulmonary disease: Secondary | ICD-10-CM | POA: Diagnosis not present

## 2014-04-12 MED ORDER — ALBUTEROL SULFATE (2.5 MG/3ML) 0.083% IN NEBU
2.5000 mg | INHALATION_SOLUTION | Freq: Once | RESPIRATORY_TRACT | Status: AC
  Administered 2014-04-12: 2.5 mg via RESPIRATORY_TRACT

## 2014-04-12 NOTE — Progress Notes (Signed)
   Subjective:    Patient ID: Mandy Shaw, female    DOB: 1961/05/11, 53 y.o.   MRN: [id number]  HPI COPD-here for spirometry today. When she came in for her last visit she noted that she was having difficulty inhaling on her inhalers. I did verify that she was using good technique and I was concerned that she might actually be feeling more short of breath. She is still smoking and we did encourage cessation at last office visit. She mostly complains that she has difficulty taking a deep breath in.   Review of Systems     Objective:   Physical Exam  Constitutional: She is oriented to person, place, and time. She appears well-developed and well-nourished.  HENT:  Head: Normocephalic.  Neurological: She is alert and oriented to person, place, and time.  Skin: Skin is warm and dry.  Psychiatric: She has a normal mood and affect.          Assessment & Plan:  COPD-spirometry results today show  FVC is 86%. This is down from 94% on her spirometry done in 2013. FEV1 is 85%. Previous with 86%. And ratio is 79%. No significant response to albuterol. Since it does appear that she has some mild restriction I would like to get a consult with pulmonology. She will need some additional testing done.  will refer to pulmonolgy.  Reviewed results with her today.   Encouraged smoking cessation.

## 2014-04-25 ENCOUNTER — Encounter: Payer: Self-pay | Admitting: Pulmonary Disease

## 2014-04-25 ENCOUNTER — Ambulatory Visit (INDEPENDENT_AMBULATORY_CARE_PROVIDER_SITE_OTHER): Payer: BC Managed Care – PPO | Admitting: Pulmonary Disease

## 2014-04-25 VITALS — BP 120/70 | HR 100 | Temp 98.7°F | Ht 67.0 in | Wt 110.1 lb

## 2014-04-25 DIAGNOSIS — J449 Chronic obstructive pulmonary disease, unspecified: Secondary | ICD-10-CM

## 2014-04-25 NOTE — Assessment & Plan Note (Addendum)
Although she is significant history of smoking, she does not seem to have significant COPD by spirometry. As such I have asked her to stop Anoro. Also note the paradoxical drop in FEV1, after bronchodilator. This seems to be consistent over a year on 2 different recordings. would focus our efforts at this point on smoking cessation alone If her cough is improved with smoking cessation alone, then she can also quit taking Qvar Take mucinex twice daily x 2 weeks Take the flu shot next month

## 2014-04-25 NOTE — Patient Instructions (Signed)
You have chronic bronchitis from smoking Your lung function is OK Key is to STOP smoking !! You can stop Anoro Take mucinex twice daily x 2 weeks Once cough is decreased, you can stop qvar. Take the flu shot next month

## 2014-04-25 NOTE — Progress Notes (Signed)
Subjective:    Patient ID: Mandy Shaw, female    DOB: [birth year], 53 y.o.   MRN: [id number]  HPI PCP - metheney  53 year old smoker with bipolar depression presents for evaluation of dyspnea. She was told that she has'COPD'. She reports a dry cough on and off for the past 6 months. She is able to walk in the grocery store, denies wheezing or sputum production. She sees her psychiatrist every 5 months (Dr Erling Cruz) and her counselor every week. She was started on Anoro and Qvar, she's not certain that these medications are helping her in anyway. She reports a difficulty breathing 'in'for at least 3 years or more. She is also maintained on Robaxin for fibromyalgia, melatonin for insomnia, Prilosec and Zantac for GERD, and several psychotropic medications. Chest x-ray 11/2012 appears clear without any infiltrates Hemoglobin was 16.5 in 11/2012. Although she is stated as having stage3 CKD, I note a GFR 68 on 04/09/14. She smoked about half pack per day ,more than 20 pack years but has just managed to quit July 31 Spirometry in 04/12/14 showed FEV1 of 2.46-85%, with FVC of 86% and ratio of 79. Paradoxically FEV1 dropped to 70% with bronchodilator. Similarly, in 11/2011, FEV1 was 89% to but dropped to 77% postbronchodilator  Past Medical History  Diagnosis Date  . COPD (chronic obstructive pulmonary disease)   . Thyroid disease   . Insomnia   . GERD (gastroesophageal reflux disease)   . Arthritis   . Bipolar 1 disorder   . Personality disorder   . 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 Cesarean section    . Cholecystectomy    . Thyroidectomy    . Leep    . Incontinence surgery    . Eye surgery    . Induced abortion    . Cardiac surgery      flap oh heart removed  . Laparotomy      exploratory for pain  . Condyloma excision/fulguration    . Right hip surgery  09/26/2013    Dr. Arnette Norris  . Left hip surgery   01/09/14    Dr. Arnette Norris   Allergies  Allergen Reactions  . Other Anaphylaxis    MANGO, PARSLEY.  Clancy Gourd [Nitrofurantoin Macrocrystal] Itching  . Neurontin [Gabapentin]   . Penicillins     REACTION: Anaphalatic  . Sulfonamide Derivatives     REACTION: Anaphalatic    History   Social History  . Marital Status: Married    Spouse Name: N/A    Number of Children: 1  . Years of Education: N/A   Occupational History  . disabled    Social History Main Topics  . Smoking status: Former Smoker -- 1.50 packs/day for 40 years    Types: Cigarettes    Quit date: 04/19/2014  . Smokeless tobacco: Not on file  . Alcohol Use: Yes     Comment: socially  . Drug Use: No  . Sexual Activity: Yes    Partners: Male     Comment: married, 1 daughter, 1  1/2 yrs college, self-employed, no reg exercise, 3 caffeine drinks daily.   Other Topics Concern  . Not on file   Social History Narrative  . No narrative on file    Family History  Problem Relation Age of Onset  . Alcohol abuse Father      Review of Systems  Constitutional: Positive for activity change  and unexpected weight change. Negative for fever.  HENT: Positive for dental problem. Negative for congestion, ear pain, nosebleeds, postnasal drip, rhinorrhea, sinus pressure, sneezing, sore throat and trouble swallowing.   Eyes: Negative for redness and itching.  Respiratory: Positive for cough and shortness of breath. Negative for chest tightness and wheezing.   Cardiovascular: Positive for leg swelling. Negative for palpitations.  Gastrointestinal: Negative for nausea and vomiting.  Genitourinary: Negative for dysuria.  Musculoskeletal: Negative for joint swelling.  Skin: Negative for rash.  Neurological: Positive for headaches.  Hematological: Does not bruise/bleed easily.  Psychiatric/Behavioral: Positive for dysphoric mood. The patient is nervous/anxious.        Objective:   Physical Exam  [place],  thin woman in no distress, flat affect ENT - no lesions, no post nasal drip Neck: No JVD, no thyromegaly, no carotid bruits Lungs: no use of accessory muscles, no dullness to percussion, decreased without rales or rhonchi  Cardiovascular: Rhythm regular, heart sounds  normal, no murmurs or gallops, no peripheral edema Abdomen: soft and non-tender, no hepatosplenomegaly, BS normal. Musculoskeletal: No deformities, no cyanosis or clubbing Neuro:  alert, non focal, slow reaction time       Assessment & Plan:

## 2014-05-01 ENCOUNTER — Other Ambulatory Visit: Payer: Self-pay | Admitting: Family Medicine

## 2014-05-27 ENCOUNTER — Other Ambulatory Visit: Payer: Self-pay | Admitting: Family Medicine

## 2014-06-01 LAB — HEPATIC FUNCTION PANEL
ALK PHOS: 79 U/L (ref 25–125)
ALT: 15 U/L (ref 7–35)
AST: 17 U/L (ref 13–35)

## 2014-06-01 LAB — HEPATITIS PANEL, ACUTE
HBsAg Conf: NEGATIVE
Hep A IgM Interp: NEGATIVE
Hep B C IgM Interp: NEGATIVE

## 2014-06-01 LAB — CMP14+EGFR: Calcium: 10 mg/dL

## 2014-06-01 LAB — BASIC METABOLIC PANEL
Creatinine: 1.2 mg/dL — AB (ref <=1.1)
Potassium: 5 mmol/L (ref 3.4–5.3)
Sodium: 143 mmol/L (ref 137–147)

## 2014-06-01 LAB — PTH, INTACT: PTH Interp: 20

## 2014-06-06 NOTE — Progress Notes (Signed)
A user error has taken place: wrong visit type, closed for administrative reasons

## 2014-06-25 ENCOUNTER — Other Ambulatory Visit: Payer: Self-pay | Admitting: Family Medicine

## 2014-07-07 ENCOUNTER — Other Ambulatory Visit: Payer: Self-pay | Admitting: Family Medicine

## 2014-07-22 ENCOUNTER — Encounter: Payer: Self-pay | Admitting: Pulmonary Disease

## 2014-07-26 ENCOUNTER — Other Ambulatory Visit: Payer: Self-pay | Admitting: Family Medicine

## 2014-07-30 ENCOUNTER — Other Ambulatory Visit: Payer: Self-pay | Admitting: Family Medicine

## 2014-08-01 ENCOUNTER — Other Ambulatory Visit: Payer: Self-pay | Admitting: Family Medicine

## 2014-08-22 ENCOUNTER — Other Ambulatory Visit: Payer: Self-pay | Admitting: Family Medicine

## 2014-08-29 ENCOUNTER — Other Ambulatory Visit: Payer: Self-pay | Admitting: Family Medicine

## 2014-09-17 ENCOUNTER — Ambulatory Visit (INDEPENDENT_AMBULATORY_CARE_PROVIDER_SITE_OTHER): Payer: BC Managed Care – PPO

## 2014-09-17 ENCOUNTER — Ambulatory Visit (INDEPENDENT_AMBULATORY_CARE_PROVIDER_SITE_OTHER): Payer: BC Managed Care – PPO | Admitting: Family Medicine

## 2014-09-17 ENCOUNTER — Encounter: Payer: Self-pay | Admitting: Family Medicine

## 2014-09-17 VITALS — BP 100/67 | HR 118 | Ht 67.0 in | Wt 102.0 lb

## 2014-09-17 DIAGNOSIS — J449 Chronic obstructive pulmonary disease, unspecified: Secondary | ICD-10-CM

## 2014-09-17 DIAGNOSIS — M25512 Pain in left shoulder: Secondary | ICD-10-CM | POA: Diagnosis not present

## 2014-09-17 DIAGNOSIS — R634 Abnormal weight loss: Secondary | ICD-10-CM

## 2014-09-17 DIAGNOSIS — E785 Hyperlipidemia, unspecified: Secondary | ICD-10-CM

## 2014-09-17 DIAGNOSIS — G8929 Other chronic pain: Secondary | ICD-10-CM

## 2014-09-17 DIAGNOSIS — E039 Hypothyroidism, unspecified: Secondary | ICD-10-CM | POA: Diagnosis not present

## 2014-09-17 DIAGNOSIS — N183 Chronic kidney disease, stage 3 (moderate): Secondary | ICD-10-CM

## 2014-09-17 DIAGNOSIS — N1832 Chronic kidney disease, stage 3b: Secondary | ICD-10-CM

## 2014-09-17 DIAGNOSIS — Z87891 Personal history of nicotine dependence: Secondary | ICD-10-CM | POA: Insufficient documentation

## 2014-09-17 DIAGNOSIS — Z720 Tobacco use: Secondary | ICD-10-CM

## 2014-09-17 MED ORDER — BECLOMETHASONE DIPROPIONATE 40 MCG/ACT IN AERS: 1.0000 | INHALATION_SPRAY | Freq: Two times a day (BID) | RESPIRATORY_TRACT | Status: DC

## 2014-09-17 MED ORDER — TRAZODONE HCL 100 MG PO TABS: 100.0000 mg | ORAL_TABLET | Freq: Every day | ORAL | Status: DC

## 2014-09-17 MED ORDER — OMEGA-3-ACID ETHYL ESTERS 1 G PO CAPS: ORAL_CAPSULE | ORAL | Status: DC

## 2014-09-17 MED ORDER — SIMVASTATIN 20 MG PO TABS: ORAL_TABLET | ORAL | Status: DC

## 2014-09-17 NOTE — Progress Notes (Signed)
   Subjective:    Patient ID: Mandy Shaw, female    DOB: November 05, 1960, 53 y.o.   MRN: [id number]  HPI COPD - no recent flares. Not using her albutero She is down to 5 cig per day. Still gets a lot of sputum especially in the mornings..    Hyperlipidemia - doing well on statin. No myalgias. Needs refills.    Abnormal weight loss - She has been really stressed. Has lost 12 lbs in the last 6 months. Her psych is threatening to put her in the hosptial if she doesn't gain weight. She says that her psychiatrist feels that she may have an eating disorder.   Asked for an  Xray of her left shoulder. Pain with reaching back and crossing arm. Pain on the left outer shoulder.  No pain or injury.  Started over the summer. Had similar problems in her right shoulder years ago. Had surgery on that shoulder for arthritis.   Review of Systems     Objective:   Physical Exam  Constitutional: She is oriented to person, place, and time. She appears well-developed and well-nourished.  HENT:  Head: Normocephalic and atraumatic.  Cardiovascular: Normal rate, regular rhythm and normal heart sounds.   Pulmonary/Chest: Effort normal and breath sounds normal.  Musculoskeletal:  Left shoulder with dec ROM  Able to extend to about 75 degree.  Able to externally rotate to about 50 degree. Diffifulty reaching across.  Nontender over the shoulder joint.    Neurological: She is alert and oriented to person, place, and time.  Skin: Skin is warm and dry.  Psychiatric: She has a normal mood and affect. Her behavior is normal.          Assessment & Plan:  COPD- stable. No recent changes or flares. She last saw the pulmonologist in August. We will go ahead and refill her Qvar and albuterol today. Still encouraged her to work on smoking cessation.  Tob abuse - gave her encouragement today. I'm glad that she is down to 5 cigarettes per day. Encouraged her to keep working at it and try to set some goals for  herself.  Abnormal weight loss - Will check TSH just to rule out any problem with her thyroid that could be contributing to the abnormal weight loss. Otherwise recommend that she continue to follow recommendations by her psychiatrist.  Left shoulder pain-most consistent with frozen shoulder. Will get x-rays today and I'll call her with results. She may need to follow back up with her orthopedist to just did surgery on her right shoulder earlier this year.

## 2014-09-24 ENCOUNTER — Other Ambulatory Visit: Payer: Self-pay | Admitting: Family Medicine

## 2014-10-02 ENCOUNTER — Telehealth: Payer: Self-pay

## 2014-10-02 DIAGNOSIS — E870 Hyperosmolality and hypernatremia: Secondary | ICD-10-CM

## 2014-10-02 DIAGNOSIS — R748 Abnormal levels of other serum enzymes: Secondary | ICD-10-CM

## 2014-10-02 LAB — CBC AND DIFFERENTIAL
Hemoglobin: 16.3 g/dL — AB (ref 12.0–16.0)
Platelets: 174 10*3/uL (ref 150–399)
WBC: 6.3 10^3/mL

## 2014-10-02 LAB — HEPATIC FUNCTION PANEL
ALT: 37 U/L — AB (ref 7–35)
AST: 29 U/L (ref 13–35)

## 2014-10-02 LAB — LIPID PANEL
CHOLESTEROL: 201 mg/dL — AB (ref 0–200)
HDL: 55 mg/dL (ref 35–70)
LDL CALC: 123 mg/dL
TRIGLYCERIDES: 116 mg/dL (ref 40–160)

## 2014-10-02 LAB — BASIC METABOLIC PANEL
CREATININE: 1.1 mg/dL (ref 0.5–1.1)
Glucose: 123 mg/dL
Potassium: 4.6 mmol/L (ref 3.4–5.3)
Sodium: 148 mmol/L — AB (ref 137–147)

## 2014-10-02 LAB — TSH: TSH: 1.1 u[IU]/mL (ref 0.41–5.90)

## 2014-10-02 NOTE — Telephone Encounter (Signed)
Laiana called wanting the number to her Neurologist because she needs refills. [place] PA: Boyd Kerbs D MD Address: [address], [place], [place] [address] [phone]

## 2014-10-03 LAB — COMPLETE METABOLIC PANEL WITH GFR: GFR CALC NON AF AMER: 56

## 2014-10-03 NOTE — Telephone Encounter (Signed)
Please also call patient and let her know that I did receive her lab work from [facility] today. Hemoglobin is still elevated at 16.3 but this is been stable over the last year or 2. White blood cells are normal. No sign of acute infection. Her kidney function was mildly elevated at 1.13.Marland Kitchen Sodium was also mildly elevated. One of the liver enzymes was mild elevated at 37. I would like to repeat the sodium and liver enzymes in 2 weeks. Avoid any Tylenol products or excess alcohol products. Total cholesterol and LDL cholesterol are mildly elevated. Triglycerides look great. Make sure getting regular exercise for 30 minutes 5 days per week in addition to low fat diet. Thyroid level looks great.

## 2014-10-04 NOTE — Telephone Encounter (Signed)
Patient advised. Lab order printed for patient to pick up to take with her to [facility]. She is aware.

## 2014-10-27 ENCOUNTER — Encounter: Payer: Self-pay | Admitting: Emergency Medicine

## 2014-10-31 ENCOUNTER — Other Ambulatory Visit: Payer: Self-pay | Admitting: Family Medicine

## 2014-11-13 ENCOUNTER — Other Ambulatory Visit: Payer: Self-pay | Admitting: Family Medicine

## 2014-11-21 ENCOUNTER — Other Ambulatory Visit: Payer: Self-pay | Admitting: Family Medicine

## 2014-11-28 ENCOUNTER — Encounter: Payer: Self-pay | Admitting: Family Medicine

## 2014-12-03 ENCOUNTER — Encounter: Payer: Self-pay | Admitting: Family Medicine

## 2015-01-15 DIAGNOSIS — F319 Bipolar disorder, unspecified: Secondary | ICD-10-CM | POA: Diagnosis not present

## 2015-01-17 ENCOUNTER — Encounter: Payer: Self-pay | Admitting: Family Medicine

## 2015-03-18 ENCOUNTER — Other Ambulatory Visit: Payer: Self-pay | Admitting: Family Medicine

## 2015-03-20 ENCOUNTER — Other Ambulatory Visit: Payer: Self-pay | Admitting: Family Medicine

## 2015-03-26 ENCOUNTER — Other Ambulatory Visit: Payer: Self-pay | Admitting: Family Medicine

## 2015-04-16 DIAGNOSIS — F319 Bipolar disorder, unspecified: Secondary | ICD-10-CM | POA: Diagnosis not present

## 2015-05-14 DIAGNOSIS — F319 Bipolar disorder, unspecified: Secondary | ICD-10-CM | POA: Diagnosis not present

## 2015-06-10 ENCOUNTER — Telehealth: Payer: Self-pay | Admitting: Family Medicine

## 2015-06-10 NOTE — Telephone Encounter (Signed)
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 States she just got some information that the paperwork was not filled out and sent in. Will route to PCP's assistant for review on if the paperwork was completed.

## 2015-06-11 NOTE — Telephone Encounter (Signed)
Looked thru pt forms and found these forms and re-faxed to [facility].Mandy Shaw [place]

## 2015-07-01 ENCOUNTER — Other Ambulatory Visit: Payer: Self-pay | Admitting: Family Medicine

## 2015-07-08 ENCOUNTER — Encounter: Payer: Self-pay | Admitting: Family Medicine

## 2015-07-08 ENCOUNTER — Ambulatory Visit (INDEPENDENT_AMBULATORY_CARE_PROVIDER_SITE_OTHER): Payer: BLUE CROSS/BLUE SHIELD | Admitting: Family Medicine

## 2015-07-08 ENCOUNTER — Ambulatory Visit (HOSPITAL_BASED_OUTPATIENT_CLINIC_OR_DEPARTMENT_OTHER)
Admission: RE | Admit: 2015-07-08 | Discharge: 2015-07-08 | Disposition: A | Discharge status: Home / Self Care | Payer: BLUE CROSS/BLUE SHIELD | Source: Ambulatory Visit | Attending: Family Medicine | Admitting: Family Medicine

## 2015-07-08 VITALS — BP 107/76 | HR 96 | Temp 98.6°F | Resp 16 | Wt 100.7 lb

## 2015-07-08 DIAGNOSIS — N261 Atrophy of kidney (terminal): Secondary | ICD-10-CM | POA: Diagnosis not present

## 2015-07-08 DIAGNOSIS — F319 Bipolar disorder, unspecified: Secondary | ICD-10-CM | POA: Diagnosis not present

## 2015-07-08 DIAGNOSIS — Z720 Tobacco use: Secondary | ICD-10-CM

## 2015-07-08 DIAGNOSIS — T7840XA Allergy, unspecified, initial encounter: Secondary | ICD-10-CM | POA: Diagnosis not present

## 2015-07-08 DIAGNOSIS — R1013 Epigastric pain: Secondary | ICD-10-CM | POA: Insufficient documentation

## 2015-07-08 DIAGNOSIS — R93421 Abnormal radiologic findings on diagnostic imaging of right kidney: Secondary | ICD-10-CM | POA: Insufficient documentation

## 2015-07-08 DIAGNOSIS — M79643 Pain in unspecified hand: Secondary | ICD-10-CM

## 2015-07-08 DIAGNOSIS — I7090 Unspecified atherosclerosis: Secondary | ICD-10-CM | POA: Insufficient documentation

## 2015-07-08 DIAGNOSIS — L309 Dermatitis, unspecified: Secondary | ICD-10-CM

## 2015-07-08 LAB — POCT URINALYSIS DIPSTICK
Bilirubin, UA: NEGATIVE
Glucose, UA: NEGATIVE
KETONES UA: NEGATIVE
Nitrite, UA: POSITIVE
PH UA: 7
PROTEIN UA: NEGATIVE
SPEC GRAV UA: 1.01
Urobilinogen, UA: 0.2

## 2015-07-08 MED ORDER — DIPHENHYDRAMINE HCL 25 MG PO CAPS
25.0000 mg | ORAL_CAPSULE | Freq: Once | ORAL | Status: AC
  Administered 2015-07-08: 25 mg via ORAL

## 2015-07-08 MED ORDER — CIPROFLOXACIN HCL 500 MG PO TABS
500.0000 mg | ORAL_TABLET | Freq: Two times a day (BID) | ORAL | Status: AC
Start: 2015-07-08 — End: 2015-07-11

## 2015-07-08 MED ORDER — TRIAMCINOLONE ACETONIDE 0.5 % EX OINT: 1.0000 "application " | TOPICAL_OINTMENT | Freq: Every day | CUTANEOUS | Status: DC | PRN

## 2015-07-08 MED ORDER — IOHEXOL 300 MG/ML  SOLN
100.0000 mL | Freq: Once | INTRAMUSCULAR | Status: AC | PRN
  Administered 2015-07-08: 100 mL via INTRAVENOUS

## 2015-07-08 NOTE — Progress Notes (Signed)
Subjective:    Patient ID: Mandy Shaw, female    DOB: [birth year], 54 y.o.   MRN: [id number]  HPI Upper abdominal pain for 2 months with eating or drinking. She is continuing to lose weight.  She is on omeprazole 40mg  BID.  She had a normal colonoscopy 2 years ago. She is drinking caffeine free diet mountain dew all day long.  No vomiting.  + nausea.  No dyspagia.  No appetite. Feels like a cramp after she eats. She does feel like she has a UTI.  Has been more constipated.  No blood in the stool o rurine.    Has had a rash on her lower legs for 2 weeks. The skin has been very dry and itchy.     tob abuse - she is smoking just a little less than a pack a day.    Fingers hurt a lot for about a year.  Pain is mostly over her knuckles.  Worse on left hand.  She feels they ar swollen. Has a sister with JRA.  Has pain with flexion.    Review of Systems Last period was a year ago.  Has been having hot flashes.    BP 107/76 mmHg  Pulse 96  Temp(Src) 98.6 F (37 C) (Oral)  Resp 16  Wt 100 lb 11.2 oz (45.677 kg)  SpO2 97%  LMP 06/20/2014    Allergies  Allergen Reactions  . Other Anaphylaxis    MANGO, PARSLEY.  Clancy Gourd [Nitrofurantoin Macrocrystal] Itching  . Neurontin [Gabapentin]   . Penicillins     REACTION: Anaphalatic  . Sulfonamide Derivatives     REACTION: Anaphalatic    Past Medical History  Diagnosis Date  . COPD (chronic obstructive pulmonary disease) ([place])   . Thyroid disease   . Insomnia   . GERD (gastroesophageal reflux disease)   . Arthritis   . Bipolar 1 disorder ([place])   . Personality disorder   . 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 Cesarean section    . Cholecystectomy    . Thyroidectomy    . Leep    . Incontinence surgery    . Eye surgery    . Induced abortion    . Cardiac surgery      flap on heart removed  . Laparotomy      exploratory for pain  . Condyloma  excision/fulguration    . Right hip surgery  09/26/2013    Dr. Arnette Norris  . Left hip surgery  01/09/14    Dr. Arnette Norris  . Shoulder surgery Right 2015    Dr. Margretta Sidle    Social History   Social History  . Marital Status: Married    Spouse Name: N/A  . Number of Children: 1  . Years of Education: N/A   Occupational History  . disabled    Social History Main Topics  . Smoking status: Current Every Day Smoker -- 1.50 packs/day for 40 years    Types: Cigarettes  . Smokeless tobacco: Not on file  . Alcohol Use: 0.0 oz/week    0 Standard drinks or equivalent per week     Comment: socially  . Drug Use: No  . Sexual Activity:    Partners: Male     Comment: married, 1 daughter, 1  1/2 yrs college, self-employed, no reg exercise, 3 caffeine drinks daily.   Other Topics Concern  . Not  on file   Social History Narrative    Family History  Problem Relation Age of Onset  . Alcohol abuse Father     Outpatient Encounter Prescriptions as of 07/08/2015  Medication Sig  . ALPRAZolam (XANAX) 1 MG tablet Take 1 mg by mouth 3 (three) times daily.  . beclomethasone (QVAR) 40 MCG/ACT inhaler Inhale 1 puff into the lungs 2 (two) times daily.  . Calcium Carbonate (CALCIUM 500 PO) Take 2 tablets by mouth daily.   Sarajane Marek Sodium 30-100 MG CAPS Take by mouth at bedtime. Take 3 pills in pm  . diphenhydrAMINE (BENADRYL) 25 mg capsule Take 25 mg by mouth at bedtime and may repeat dose one time if needed.   . DULoxetine (CYMBALTA) 60 MG capsule Take 120 mg by mouth daily.   Marland Kitchen eletriptan (RELPAX) 40 MG tablet 40 mg 2 (two) times daily.   Marland Kitchen HYDROcodone-acetaminophen (NORCO) 7.5-325 MG per tablet Take 1 tablet by mouth 4 (four) times daily - after meals and at bedtime.   . levETIRAcetam (KEPPRA) 750 MG tablet Take 750 mg by mouth at bedtime.  Marland Kitchen levothyroxine (SYNTHROID, LEVOTHROID) 137 MCG tablet Take 1 tablet (137 mcg total) by mouth daily. Needs labs  . loxapine (LOXITANE) 10  MG capsule Take 10 mg by mouth. Take two tablets  At night  . methocarbamol (ROBAXIN) 750 MG tablet Take 750 mg by mouth 3 (three) times daily.   Marland Kitchen omega-3 acid ethyl esters (LOVAZA) 1 G capsule take 4 capsules by mouth once daily  . omeprazole (PRILOSEC) 40 MG capsule take 1 capsule by mouth twice a day  . primidone (MYSOLINE) 50 MG tablet Take 50 mg by mouth 2 (two) times daily.  . simvastatin (ZOCOR) 20 MG tablet take 1 tablet by mouth daily  . Topiramate ER (TROKENDI XR) 200 MG CP24 1 capsule 2 (two) times daily.   . traZODone (DESYREL) 100 MG tablet Take 1 tablet (100 mg total) by mouth at bedtime.  . Vitamins/Minerals TABS Take 1 tablet by mouth.  . Vortioxetine HBr (BRINTELLIX) 10 MG TABS Take 1 tablet by mouth daily.   . ciprofloxacin (CIPRO) 500 MG tablet Take 1 tablet (500 mg total) by mouth 2 (two) times daily.  Marland Kitchen triamcinolone ointment (KENALOG) 0.5 % Apply 1 application topically daily as needed.  . diphenhydrAMINE (BENADRYL) capsule 25 mg    No facility-administered encounter medications on file as of 07/08/2015.          Objective:   Physical Exam  Constitutional: She is oriented to person, place, and time. She appears well-developed and well-nourished.  HENT:  Head: Normocephalic and atraumatic.  Eyes: Conjunctivae are normal. Pupils are equal, round, and reactive to light.  Neck: Neck supple. No thyromegaly present.  Cardiovascular: Normal rate, regular rhythm and normal heart sounds.   Pulmonary/Chest: Effort normal and breath sounds normal.  Abdominal: Soft. Bowel sounds are normal. She exhibits no distension and no mass. There is tenderness. There is no rebound and no guarding.  Very tender in the epigastric area and mildly tender in the LUQ. LUQ palpation caused pain to radiate to below the umbilicus.  Mild rebound tenderness.  No guarding.    Musculoskeletal:  Hand with NROM of fingers.  No disinct swelling.    Lymphadenopathy:    She has no cervical  adenopathy.  Neurological: She is alert and oriented to person, place, and time.  Skin: Skin is warm and dry.  Very dry skin with excoriations on her lower legs below  the knees. I don't see a distinct rash.  Psychiatric: She has a normal mood and affect. Her behavior is normal.          Assessment & Plan:  Epigastric pain - will get stat CT with epigastric and LUQ pain.  eval for pancreatitis, mass, tumor, hepatitis.  I am very concerned about her weight loss.  Her BMI ius now 15 and she is underweight.    Tobacco abuse - encouraged cessation.   Hand pain - will do rheumatoid/autoimmune panel and hand xray on left since the worse.   Dry skin/eczema - will tx with a topical steroid cream.

## 2015-07-09 ENCOUNTER — Other Ambulatory Visit: Payer: Self-pay | Admitting: Family Medicine

## 2015-07-09 DIAGNOSIS — F5000 Anorexia nervosa, unspecified: Secondary | ICD-10-CM

## 2015-07-09 DIAGNOSIS — R1013 Epigastric pain: Secondary | ICD-10-CM

## 2015-07-09 LAB — CBC AND DIFFERENTIAL
HEMOGLOBIN: 14 g/dL (ref 12.0–16.0)
Platelets: 158 10*3/uL (ref 150–399)
WBC: 6 10*3/mL

## 2015-07-09 LAB — BASIC METABOLIC PANEL
BUN: 21 mg/dL (ref 4–21)
Creatinine: 1.2 mg/dL — AB (ref 0.5–1.1)
GLUCOSE: 83 mg/dL
Potassium: 4.5 mmol/L (ref 3.4–5.3)
Sodium: 141 mmol/L (ref 137–147)

## 2015-07-09 LAB — HEPATIC FUNCTION PANEL
ALT: 27 U/L (ref 7–35)
AST: 29 U/L (ref 13–35)
Alkaline Phosphatase: 80 U/L (ref 25–125)
BILIRUBIN, TOTAL: 0.2 mg/dL

## 2015-07-10 LAB — CHLORIDE
CHLORIDE: 105 mmol/L
CRP: 0.8
Protein: 6.2
Sedimentation Rate-Westergren: 10
TSH: 1.21

## 2015-07-10 LAB — CBC/DIFF AMBIGUOUS DEFAULT: MCV: 100

## 2015-07-10 LAB — AMYLASE: Amylase, Serum: 125

## 2015-07-10 LAB — URIC ACID: Uric Acid, Serum: 4.6

## 2015-07-10 LAB — LIPASE: LIPASE: 51

## 2015-07-10 LAB — ESTIMATED GFR: GFR CALC NON AF AMER: 50

## 2015-07-11 ENCOUNTER — Telehealth: Payer: Self-pay | Admitting: Family Medicine

## 2015-07-11 LAB — URINE CULTURE: Colony Count: >=100000

## 2015-07-11 NOTE — Telephone Encounter (Signed)
Please call patient and make sure that she is actually gone for her blood work. I have not seen the results come through. I think she was going to go to [facility]

## 2015-07-14 ENCOUNTER — Telehealth: Payer: Self-pay | Admitting: Family Medicine

## 2015-07-14 DIAGNOSIS — R748 Abnormal levels of other serum enzymes: Secondary | ICD-10-CM

## 2015-07-14 NOTE — Telephone Encounter (Signed)
Call pt:  Blood count is Ok, no sign of acute  Infection.  Kidney function is mildly elevated.  recheck in 1-2 weeks. Make sure hydrating well. Thyroid is normal. inflamamatory markers are normal.  Some labs are still pending.

## 2015-07-15 NOTE — Telephone Encounter (Signed)
Spoke with pt she was given results.Mandy Shaw [place]

## 2015-07-15 NOTE — Telephone Encounter (Signed)
Pt informed of result and recommendations she stated that she will these labs ordered through [facility]. Order placed up front for p/u.[name] [place]

## 2015-07-29 DIAGNOSIS — K5900 Constipation, unspecified: Secondary | ICD-10-CM | POA: Diagnosis not present

## 2015-07-29 DIAGNOSIS — R6881 Early satiety: Secondary | ICD-10-CM | POA: Diagnosis not present

## 2015-07-29 DIAGNOSIS — R634 Abnormal weight loss: Secondary | ICD-10-CM | POA: Diagnosis not present

## 2015-07-29 DIAGNOSIS — R1013 Epigastric pain: Secondary | ICD-10-CM | POA: Diagnosis not present

## 2015-08-05 ENCOUNTER — Ambulatory Visit (INDEPENDENT_AMBULATORY_CARE_PROVIDER_SITE_OTHER): Payer: BLUE CROSS/BLUE SHIELD | Admitting: Physician Assistant

## 2015-08-05 ENCOUNTER — Encounter: Payer: Self-pay | Admitting: Physician Assistant

## 2015-08-05 VITALS — BP 99/69 | HR 84 | Temp 98.4°F | Ht 67.0 in | Wt 102.0 lb

## 2015-08-05 DIAGNOSIS — R350 Frequency of micturition: Secondary | ICD-10-CM

## 2015-08-05 DIAGNOSIS — I959 Hypotension, unspecified: Secondary | ICD-10-CM

## 2015-08-05 DIAGNOSIS — J069 Acute upper respiratory infection, unspecified: Secondary | ICD-10-CM | POA: Diagnosis not present

## 2015-08-05 DIAGNOSIS — J441 Chronic obstructive pulmonary disease with (acute) exacerbation: Secondary | ICD-10-CM | POA: Diagnosis not present

## 2015-08-05 LAB — POCT URINALYSIS DIPSTICK
Bilirubin, UA: NEGATIVE
GLUCOSE UA: NEGATIVE
Ketones, UA: NEGATIVE
LEUKOCYTES UA: NEGATIVE
NITRITE UA: NEGATIVE
PH UA: 6
Protein, UA: NEGATIVE
RBC UA: NEGATIVE
Spec Grav, UA: <=1.005
UROBILINOGEN UA: 0.2

## 2015-08-05 MED ORDER — ALBUTEROL SULFATE HFA 108 (90 BASE) MCG/ACT IN AERS: 2.0000 | INHALATION_SPRAY | Freq: Four times a day (QID) | RESPIRATORY_TRACT | Status: DC | PRN

## 2015-08-05 MED ORDER — DOXYCYCLINE HYCLATE 100 MG PO TABS: 100.0000 mg | ORAL_TABLET | Freq: Two times a day (BID) | ORAL | Status: DC

## 2015-08-05 NOTE — Patient Instructions (Addendum)
Mucinex(guafenasin) OTC.  Cough-delsym/honey  Doxycycline for 10 days.

## 2015-08-05 NOTE — Progress Notes (Signed)
   Subjective:    Patient ID: Mandy Shaw, female    DOB: 1961-08-16, 54 y.o.   MRN: [id number]  HPI  A shunt is a 54 year old female who presents to the clinic with 2 days of sore throat, body aches, ear and sinus pressure. She went to see her grand baby in the hospital and soon after she developed these symptoms. She's tried Alka-Seltzer plus last night with no relief. She has a history of COPD and has started to fill her lungs feel tight and wheezing. She also complains of urinary frequency but has not resolved. She was seen about 2 weeks ago by Dr. Charise Carwin And treated for UTI. She denies any fever or abdominal pain.      Review of Systems  All other systems reviewed and are negative.      Objective:   Physical Exam  Constitutional: She is oriented to person, place, and time. She appears well-developed and well-nourished.  HENT:  Head: Normocephalic and atraumatic.  Right Ear: External ear normal.  Left Ear: External ear normal.  Nose: Nose normal.  Mouth/Throat: Oropharynx is clear and moist. No oropharyngeal exudate.  Eyes: Conjunctivae are normal. Right eye exhibits no discharge. Left eye exhibits no discharge.  Neck: Normal range of motion. Neck supple.  Cardiovascular: Normal rate, regular rhythm and normal heart sounds.   Pulmonary/Chest:  Coarse breath sounds with bilateral rhonchi.   Abdominal: Soft. Bowel sounds are normal. She exhibits no distension and no mass. There is no tenderness. There is no rebound and no guarding.  Lymphadenopathy:    She has no cervical adenopathy.  Neurological: She is alert and oriented to person, place, and time.  Skin: Skin is dry.  Psychiatric: She has a normal mood and affect. Her behavior is normal.          Assessment & Plan:  Acute upper respiratory infection/COPD exacerbation- more prone to infections with lung disease. Sent doxycycline for 10 days. Discussed other symptomatic care. Continue albuterol 2 puffs every 4 hours.  Will hold on prednisone today.   Hypotension- rechecked blood pressure and had improved. Discuss proper hydration. We'll continue to monitor.  Urinary frequency-.. Results for orders placed or performed in visit on 08/05/15  POCT urinalysis dipstick  Result Value Ref Range   Color, UA yellow    Clarity, UA clear    Glucose, UA neg    Bilirubin, UA neg    Ketones, UA neg    Spec Grav, UA <=1.005    Blood, UA neg    pH, UA 6.0    Protein, UA neg    Urobilinogen, UA 0.2    Nitrite, UA neg    Leukocytes, UA Negative Negative   Will culture. No signs of infection today. Could be some OAB symptoms. Follow up with PCP.To note patient complains of urinary frequency but she was in our office for almost an hour and did not urinate.

## 2015-08-11 ENCOUNTER — Other Ambulatory Visit: Payer: Self-pay | Admitting: Family Medicine

## 2015-08-12 ENCOUNTER — Ambulatory Visit (INDEPENDENT_AMBULATORY_CARE_PROVIDER_SITE_OTHER): Payer: BLUE CROSS/BLUE SHIELD | Admitting: Family Medicine

## 2015-08-12 ENCOUNTER — Encounter: Payer: Self-pay | Admitting: Family Medicine

## 2015-08-12 VITALS — BP 90/60 | HR 106 | Temp 97.9°F | Resp 18 | Wt 99.0 lb

## 2015-08-12 DIAGNOSIS — R0789 Other chest pain: Secondary | ICD-10-CM

## 2015-08-12 DIAGNOSIS — J441 Chronic obstructive pulmonary disease with (acute) exacerbation: Secondary | ICD-10-CM | POA: Diagnosis not present

## 2015-08-12 DIAGNOSIS — J209 Acute bronchitis, unspecified: Secondary | ICD-10-CM | POA: Diagnosis not present

## 2015-08-12 MED ORDER — PREDNISONE 20 MG PO TABS: 40.0000 mg | ORAL_TABLET | Freq: Every day | ORAL | Status: DC

## 2015-08-12 MED ORDER — AZITHROMYCIN 250 MG PO TABS: ORAL_TABLET | ORAL | Status: AC

## 2015-08-12 NOTE — Progress Notes (Addendum)
   Subjective:    Patient ID: Mandy Shaw, female    DOB: Sep 20, 1961, 54 y.o.   MRN: [id number]  HPI Saw Mandy Shaw about 7 days ago for URI/COPD excacerbation.  She was started on doxy and says she started to feel some better but the last few days she has been getting worse with ST, cough, bilat ear pain. NO fever, chills or sweats. Taking mucinex but not really helping to move the mucous in her chest.  Also has a dull pain in her chest, mid sternum, that she describes as constant.  Started 2 weeks ago.  Occ feels like a deep stabbing pain. Eating doesn't aggrevate her pain.  Having endoscopy on 07/30/2015.    Review of Systems     Objective:   Physical Exam  Constitutional: She is oriented to person, place, and time. She appears well-developed and well-nourished.  HENT:  Head: Normocephalic and atraumatic.  Right Ear: External ear normal.  Left Ear: External ear normal.  Nose: Nose normal.  Mouth/Throat: Oropharynx is clear and moist.  TMs and canals are clear.   Eyes: Conjunctivae and EOM are normal. Pupils are equal, round, and reactive to light.  Neck: Neck supple. No thyromegaly present.  Cardiovascular: Normal rate, regular rhythm and normal heart sounds.   Pulmonary/Chest: Effort normal. She has wheezes. She has no rales. She exhibits no tenderness.  Slight inspiratory wheezing.  Lymphadenopathy:    She has no cervical adenopathy.  Neurological: She is alert and oriented to person, place, and time.  Skin: Skin is warm and dry.  Psychiatric: She has a normal mood and affect.          Assessment & Plan:   acute bronchitis with COPD exacerbation- we'll switch her 2 azithromycin and put her on a 5 day course of prednisone. Call if not significantly better in one week. If he's not improving after the second round of antibiotic since she will need a chest x-ray for further evaluation. Next  Atypical chest pain-since the pain is constant I really do not thin it's cardiac but we'll  get an EKG today. She does have endoscopy set up for about 2 weeks from now. If this comes back completely normal then we could consider a stress test for further evaluation She is no prior history of coronary artery disease but certainly he is high risk with her smoking history and hyperlipidemia.   EKG shows rate of 85 bpm, normal sinus rhythm with no acute ST-T wave changes. There is some rightward axis deviation.

## 2015-08-14 ENCOUNTER — Other Ambulatory Visit: Payer: Self-pay | Admitting: Family Medicine

## 2015-08-21 ENCOUNTER — Emergency Department (INDEPENDENT_AMBULATORY_CARE_PROVIDER_SITE_OTHER)
Admission: EM | Admit: 2015-08-21 | Discharge: 2015-08-21 | Disposition: A | Discharge status: Home / Self Care | Payer: BLUE CROSS/BLUE SHIELD | Source: Home / Self Care | Attending: Family Medicine | Admitting: Family Medicine

## 2015-08-21 ENCOUNTER — Encounter: Payer: Self-pay | Admitting: *Deleted

## 2015-08-21 ENCOUNTER — Emergency Department (INDEPENDENT_AMBULATORY_CARE_PROVIDER_SITE_OTHER): Payer: BLUE CROSS/BLUE SHIELD

## 2015-08-21 DIAGNOSIS — R634 Abnormal weight loss: Secondary | ICD-10-CM | POA: Diagnosis not present

## 2015-08-21 DIAGNOSIS — R0789 Other chest pain: Secondary | ICD-10-CM | POA: Diagnosis not present

## 2015-08-21 DIAGNOSIS — R079 Chest pain, unspecified: Secondary | ICD-10-CM

## 2015-08-21 DIAGNOSIS — M940 Chondrocostal junction syndrome [Tietze]: Secondary | ICD-10-CM | POA: Diagnosis not present

## 2015-08-21 LAB — POCT CBC W AUTO DIFF (K'VILLE URGENT CARE)

## 2015-08-21 LAB — TROPONIN I

## 2015-08-21 MED ORDER — HYDROCODONE-ACETAMINOPHEN 5-325 MG PO TABS: 1.0000 | ORAL_TABLET | Freq: Four times a day (QID) | ORAL | Status: DC | PRN

## 2015-08-21 MED ORDER — KETOROLAC TROMETHAMINE 30 MG/ML IJ SOLN
30.0000 mg | Freq: Once | INTRAMUSCULAR | Status: AC
  Administered 2015-08-21: 30 mg via INTRAMUSCULAR

## 2015-08-21 NOTE — ED Notes (Signed)
Pt c/o 3-4 weeks of central CP, heaviness. It has become worse in the last week. Saw PCP 1 week ago, EKG essentially normal. Pain has worsened 10/10 and been constant today associated with right side pain, SOB and tremors, worse than her normal. EKG done and given to Dr. Assunta Found.

## 2015-08-21 NOTE — ED Provider Notes (Addendum)
CSN: [id number]     Arrival date & time 08/21/15  1617 History   First MD Initiated Contact with Patient 08/21/15 1625     Chief Complaint  Patient presents with  . Chest Pain  . Shortness of Breath      HPI Comments: Patient complains of one month history of non-radiating anterior sternal chest pain.  Initially the pain occurred occasionally.  The pain gradually became more frequent until about 2 weeks ago when it began to occur daily, but never related to activity or food intake.  The pain gradually lasted longer each day, until today when it became constant.  She does have a history of fibromyalgia, but that pain is different. Patient was treated 16 days ago for a URI and exacerbation of COPD with doxycycline.  Nine days ago she returned to her PCP for follow-up reporting that she had not improved significantly.  Her EKG at that time showed no acute changes. She was prescribed azithromycin and a course of prednisone. Patient presents today reporting that her cough is better, but she still has sinus congestion.  No fever. Her appetite is poor but she denies nausea/vomiting.  She states that she feels more shortness of breath than usual.  She notes that her pain is not worse with activity but limits her activity.  Her pain is worse at night when she is supine on her left side. Patient has been losing weight steadily, with chronic abdominal pain, and is scheduled for endoscopy.  The history is provided by the patient and the spouse.    Past Medical History  Diagnosis Date  . COPD (chronic obstructive pulmonary disease) ([place])   . Thyroid disease   . Insomnia   . GERD (gastroesophageal reflux disease)   . Arthritis   . Bipolar 1 disorder ([place])   . Personality disorder   . 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 Cesarean section    . Cholecystectomy    . Thyroidectomy    . Leep    . Incontinence surgery    . Eye  surgery    . Induced abortion    . Cardiac surgery      flap on heart removed  . Laparotomy      exploratory for pain  . Condyloma excision/fulguration    . Right hip surgery  09/26/2013    Dr. Arnette Norris  . Left hip surgery  01/09/14    Dr. Arnette Norris  . Shoulder surgery Right 2015    Dr. Margretta Sidle   Family History  Problem Relation Age of Onset  . Alcohol abuse Father    Social History  Substance Use Topics  . Smoking status: Current Every Day Smoker -- 1.50 packs/day for 40 years    Types: Cigarettes  . Smokeless tobacco: None  . Alcohol Use: 0.0 oz/week    0 Standard drinks or equivalent per week     Comment: socially   OB History    Gravida Para Term Preterm AB TAB SAB Ectopic Multiple Living   2 0  0 1 1    1      Review of Systems  Constitutional: Positive for activity change, appetite change and fatigue. Negative for fever, chills and diaphoresis.  HENT: Negative.   Eyes: Negative.   Respiratory: Positive for chest tightness and shortness of breath. Negative for cough, wheezing and stridor.   Cardiovascular: Negative.   Gastrointestinal: Positive for  abdominal pain. Negative for nausea, vomiting, diarrhea, constipation and blood in stool.  Genitourinary: Negative.   Musculoskeletal: Negative.   Skin: Negative.   Neurological: Negative for headaches.  All other systems reviewed and are negative.   Allergies  Other; Macrodantin; Neurontin; Penicillins; and Sulfonamide derivatives  Home Medications   Prior to Admission medications   Medication Sig Start Date End Date Taking? Authorizing Provider  albuterol (PROVENTIL HFA;VENTOLIN HFA) 108 (90 BASE) MCG/ACT inhaler Inhale 2 puffs into the lungs every 6 (six) hours as needed for wheezing or shortness of breath. 08/05/15   Jade L Breeback, PA-C  Calcium Carbonate (CALCIUM 500 PO) Take 2 tablets by mouth daily.     Historical Provider, MD  Casanthranol-Docusate Sodium 30-100 MG CAPS Take by mouth at bedtime.  Take 3 pills in pm    Hali Marry, MD  diazepam (VALIUM) 5 MG tablet Take 5 mg by mouth 3 (three) times daily. 2 tabs qhs prn    Historical Provider, MD  diphenhydrAMINE (BENADRYL) 25 mg capsule Take 25 mg by mouth at bedtime and may repeat dose one time if needed.     Historical Provider, MD  DULoxetine (CYMBALTA) 60 MG capsule Take 120 mg by mouth daily.     Historical Provider, MD  eletriptan (RELPAX) 40 MG tablet 40 mg 2 (two) times daily.  01/20/14   Historical Provider, MD  HYDROcodone-acetaminophen (NORCO/VICODIN) 5-325 MG tablet Take 1 tablet by mouth every 6 (six) hours as needed for severe pain. 08/21/15   Kandra Nicolas, MD  levothyroxine (SYNTHROID, LEVOTHROID) 137 MCG tablet take 1 tablet by mouth ONCE DAILY; NEED LABS 08/11/15   Hali Marry, MD  omega-3 acid ethyl esters (LOVAZA) 1 G capsule take 4 capsules by mouth once daily 11/13/14   Hali Marry, MD  omeprazole (PRILOSEC) 40 MG capsule take 1 capsule by mouth twice a day 08/18/15   Hali Marry, MD  primidone (MYSOLINE) 50 MG tablet Take 50 mg by mouth 2 (two) times daily.    Historical Provider, MD  simvastatin (ZOCOR) 20 MG tablet take 1 tablet by mouth daily 03/18/15   Hali Marry, MD  Topiramate ER (TROKENDI XR) 200 MG CP24 1 capsule 2 (two) times daily.  01/20/14   Historical Provider, MD  traZODone (DESYREL) 100 MG tablet Take 1 tablet (100 mg total) by mouth at bedtime. 09/17/14   Hali Marry, MD  triamcinolone ointment (KENALOG) 0.5 % Apply 1 application topically daily as needed. 07/08/15   Hali Marry, MD  Vitamins/Minerals TABS Take 1 tablet by mouth.    Historical Provider, MD   Meds Ordered and Administered this Visit   Medications  ketorolac (TORADOL) 30 MG/ML injection 30 mg (30 mg Intramuscular Given 08/21/15 1818)    BP 119/81 mmHg  Pulse 110  Resp 20  Wt 97 lb 1.9 oz (44.053 kg)  SpO2 96%  LMP 06/20/2014 No data found.   Physical Exam    Pulmonary/Chest:      Patient has pronounced chest and sternum tenderness to palpation as noted on diagram.     Nursing notes and Vital Signs reviewed. Appearance:  Patient appears cachectic and older than age, but in no acute distress Eyes:  Pupils are equal, round, and reactive to light and accomodation.  Extraocular movement is intact.  Conjunctivae are not inflamed  Ears:  Canals normal.  Tympanic membranes normal.  Nose:   Normal turbinates.  No sinus tenderness.    Pharynx:  Normal  Neck:  Supple.  No adenopathy.  Note tender right supraclavicular node Lungs:   Tubular breath sounds; no rales, rhonchi, wheezes.   Moving air well. Chest:  Distinct tenderness to palpation over mid-sternum; palpation there recreates her chest pain.  There is also tenderness to palpation posterior inferior ribs bilaterally. Heart:  Regular rate and rhythm without murmurs, rubs, or gallops.  Abdomen:   Diffuse tenderness without masses or hepatosplenomegaly.  Bowel sounds are present.  Bilateral flank tenderness is present. Extremities:  No edema.  No calf tenderness Skin:  No rash present.   ED Course  Procedures none    Labs Reviewed  TROPONIN I  POCT CBC W AUTO DIFF ([facility]):  WBC 10.1; LY 35.9; MO 2.4; GR 61.7; Hgb 15.8; Platelets 161     EKG: Rate:  95 BPM PR:  130 msec QT:  354 msec QTcH:  415 msec QRSD:  96 msec QRS axis:  90 degrees Interpretation:  Sinus rhythm; no acute changes   Imaging Review Dg Chest 2 View  08/21/2015  CLINICAL DATA:  Chest pain for 1 month EXAM: CHEST  2 VIEW COMPARISON:  12/13/2012 FINDINGS: Lungs are hyperaerated and clear. Normal heart size. No pneumothorax or pleural effusion. Apical pleural scarring. IMPRESSION: No active cardiopulmonary disease. Electronically Signed   By: Marybelle Killings M.D.   On: 08/21/2015 17:15       MDM   1. Other chest pain; appears to be musculoskeletal   2. Costochondritis   3. Loss of weight     Administered Toradol 30mg  IM Rx for Lortab 5-325, Q6hr prn  Although patient's anterior chest pain appears to be costochondritis, will stratify risk by obtaining stat troponin. Followup with PCP as soon as possible for further intervention.    Kandra Nicolas, MD 08/21/15 1930  Addendum: Telephone call from lab:  Unable to run troponin on blood specimen received. Contacted patient:  She reports that she has had significant pain relief after Toradol injection.  Has not yet taken Lortab. Recommend that she proceed to [facility] ER if pain increases.  Followup with PCP tomorrow.  Kandra Nicolas, MD 08/21/15 [phone]

## 2015-08-21 NOTE — Discharge Instructions (Signed)
If symptoms become significantly worse during the night or over the weekend, proceed to the local emergency room.    Costochondritis Costochondritis, sometimes called Tietze syndrome, is a swelling and irritation (inflammation) of the tissue (cartilage) that connects your ribs with your breastbone (sternum). It causes pain in the chest and rib area. Costochondritis usually goes away on its own over time. It can take up to 6 weeks or longer to get better, especially if you are unable to limit your activities. CAUSES  Some cases of costochondritis have no known cause. Possible causes include:  Injury (trauma).  Exercise or activity such as lifting.  Severe coughing. SIGNS AND SYMPTOMS  Pain and tenderness in the chest and rib area.  Pain that gets worse when coughing or taking deep breaths.  Pain that gets worse with specific movements. DIAGNOSIS  Your health care provider will do a physical exam and ask about your symptoms. Chest X-rays or other tests may be done to rule out other problems. TREATMENT  Costochondritis usually goes away on its own over time. Your health care provider may prescribe medicine to help relieve pain. HOME CARE INSTRUCTIONS   Avoid exhausting physical activity. Try not to strain your ribs during normal activity. This would include any activities using chest, abdominal, and side muscles, especially if heavy weights are used.  Apply ice to the affected area for the first 2 days after the pain begins.  Put ice in a plastic bag.  Place a towel between your skin and the bag.  Leave the ice on for 20 minutes, 2-3 times a day.  Only take over-the-counter or prescription medicines as directed by your health care provider. SEEK MEDICAL CARE IF:  You have redness or swelling at the rib joints. These are signs of infection.  Your pain does not go away despite rest or medicine. SEEK IMMEDIATE MEDICAL CARE IF:   Your pain increases or you are very  uncomfortable.  You have shortness of breath or difficulty breathing.  You cough up blood.  You have worse chest pains, sweating, or vomiting.  You have a fever or persistent symptoms for more than 2-3 days.  You have a fever and your symptoms suddenly get worse. MAKE SURE YOU:   Understand these instructions.  Will watch your condition.  Will get help right away if you are not doing well or get worse.   This information is not intended to replace advice given to you by your health care provider. Make sure you discuss any questions you have with your health care provider.   Document Released: 06/16/2005 Document Revised: 06/27/2013 Document Reviewed: 04/10/2013 Elsevier Interactive Patient Education [employer].

## 2015-08-22 ENCOUNTER — Encounter: Payer: Self-pay | Admitting: Family Medicine

## 2015-08-22 ENCOUNTER — Ambulatory Visit (INDEPENDENT_AMBULATORY_CARE_PROVIDER_SITE_OTHER): Payer: BLUE CROSS/BLUE SHIELD | Admitting: Family Medicine

## 2015-08-22 VITALS — BP 106/64 | HR 86 | Temp 97.7°F | Resp 16 | Wt 98.9 lb

## 2015-08-22 DIAGNOSIS — R0982 Postnasal drip: Secondary | ICD-10-CM

## 2015-08-22 DIAGNOSIS — R0789 Other chest pain: Secondary | ICD-10-CM

## 2015-08-22 DIAGNOSIS — R1013 Epigastric pain: Secondary | ICD-10-CM | POA: Diagnosis not present

## 2015-08-22 DIAGNOSIS — R636 Underweight: Secondary | ICD-10-CM | POA: Diagnosis not present

## 2015-08-22 DIAGNOSIS — Z1231 Encounter for screening mammogram for malignant neoplasm of breast: Secondary | ICD-10-CM | POA: Diagnosis not present

## 2015-08-22 DIAGNOSIS — R410 Disorientation, unspecified: Secondary | ICD-10-CM

## 2015-08-22 DIAGNOSIS — R809 Proteinuria, unspecified: Secondary | ICD-10-CM | POA: Insufficient documentation

## 2015-08-22 LAB — POCT UA - MICROALBUMIN
CREATININE, POC: 100 mg/dL
MICROALBUMIN (UR) POC: 80 mg/L

## 2015-08-22 LAB — POCT GLUCOSE (DEVICE FOR HOME USE): POC GLUCOSE: 87 mg/dL (ref 70–99)

## 2015-08-22 MED ORDER — SUCRALFATE 1 GM/10ML PO SUSP: 1.0000 g | Freq: Three times a day (TID) | ORAL | Status: DC

## 2015-08-22 NOTE — Progress Notes (Signed)
   Subjective:    Patient ID: Mandy Shaw, female    DOB: November 08, 1960, 54 y.o.   MRN: [id number]  HPI  Seen last night at the [facility] for chest pain and SOB.  Had normal CXR and EKG.  Told likely costochondritis.  She does have some tenderness over the chest wall itself.  Still having some cough and drainage down the back of her throat.  No fever or chills. He saw her about 2 weeks ago for an upper respiratory infection complicated by COPD exacerbation. She was put on azithromycin as well as 5 days of prednisone. She is still continued to have pressure and fullness in both ears.  She is schedule for endoscopy next week because of her abdminal pain.   She does"t use any NSAIDs. Uses tylenol PM at night.   Feeling foggy mentally today. Says almost forgot how to get here today. She had a banana at 6AM. Had chx nuggest  last night for dinner.     Review of Systems     Objective:   Physical Exam  Constitutional: She is oriented to person, place, and time. She appears well-developed and well-nourished.  HENT:  Head: Normocephalic and atraumatic.  Right Ear: External ear normal.  Left Ear: External ear normal.  Nose: Nose normal.  Mouth/Throat: Oropharynx is clear and moist.  TMs and canals are clear.   Eyes: Conjunctivae and EOM are normal. Pupils are equal, round, and reactive to light.  Neck: Neck supple. No thyromegaly present.  Cardiovascular: Normal rate, regular rhythm and normal heart sounds.   Pulmonary/Chest: Effort normal and breath sounds normal. She has no wheezes.  Lymphadenopathy:    She has no cervical adenopathy.  Neurological: She is alert and oriented to person, place, and time.  Skin: Skin is warm and dry.  Psychiatric: She has a normal mood and affect.          Assessment & Plan:  Atypical chest pain-she did have a normal chest x-ray and EKG with normal blood work. I gave her reassurance. She is definitely extremely tender over the chest wall so I agree most likely  diagnosis is consistent with costochondritis. I do want her to avoid anti-inflammatory symptoms because of her current GI issues. Okay to use Tylenol and she was given a small quantity of pain medication at urgent care last night.  Epigastric pain/gastritis-she is scheduled in 7 days for her upper endoscopy. I am concerned that she may have some gastritis or gastric ulcers. She is on a PPI. She denies taking any aspirin or NSAIDs. Strongly encouraged her to eat more than twice a day. For example she had breakfast today and it was a banana. Explained that if she does a bit anything on her stomach that it's mostly just stomach acid) create more problems. Offered to write a perception for Carafate to coat her stomach before meals and at bedtime. Prescription sent to the pharmacy.  Mental status change today-we'll check for hypoglycemia. Her glucose was normal here in the office. She seems fairly clear and with it during the office visit. Will get some blood work today.  Underweight-most likely secondary to malnutrition. Would like to refer and get her in with a nutritionist for further treatment and evaluation. She had a normal CT of the abdomen and pelvis about a month ago.

## 2015-08-24 ENCOUNTER — Telehealth: Payer: Self-pay

## 2015-08-28 DIAGNOSIS — R809 Proteinuria, unspecified: Secondary | ICD-10-CM | POA: Diagnosis not present

## 2015-08-28 DIAGNOSIS — R339 Retention of urine, unspecified: Secondary | ICD-10-CM | POA: Diagnosis not present

## 2015-08-28 DIAGNOSIS — N183 Chronic kidney disease, stage 3 (moderate): Secondary | ICD-10-CM | POA: Diagnosis not present

## 2015-08-28 NOTE — Addendum Note (Signed)
Addended by: Huel Cote on: 08/28/2015 04:49 PM   Modules accepted: Orders

## 2015-08-29 DIAGNOSIS — K5900 Constipation, unspecified: Secondary | ICD-10-CM | POA: Diagnosis not present

## 2015-08-29 DIAGNOSIS — Z880 Allergy status to penicillin: Secondary | ICD-10-CM | POA: Diagnosis not present

## 2015-08-29 DIAGNOSIS — R6881 Early satiety: Secondary | ICD-10-CM | POA: Diagnosis not present

## 2015-08-29 DIAGNOSIS — E039 Hypothyroidism, unspecified: Secondary | ICD-10-CM | POA: Diagnosis not present

## 2015-08-29 DIAGNOSIS — Z79899 Other long term (current) drug therapy: Secondary | ICD-10-CM | POA: Diagnosis not present

## 2015-08-29 DIAGNOSIS — R1013 Epigastric pain: Secondary | ICD-10-CM | POA: Diagnosis not present

## 2015-08-29 DIAGNOSIS — Z888 Allergy status to other drugs, medicaments and biological substances status: Secondary | ICD-10-CM | POA: Diagnosis not present

## 2015-08-29 DIAGNOSIS — F419 Anxiety disorder, unspecified: Secondary | ICD-10-CM | POA: Diagnosis not present

## 2015-08-29 DIAGNOSIS — J449 Chronic obstructive pulmonary disease, unspecified: Secondary | ICD-10-CM | POA: Diagnosis not present

## 2015-08-29 DIAGNOSIS — Z882 Allergy status to sulfonamides status: Secondary | ICD-10-CM | POA: Diagnosis not present

## 2015-08-29 DIAGNOSIS — M797 Fibromyalgia: Secondary | ICD-10-CM | POA: Diagnosis not present

## 2015-08-29 DIAGNOSIS — E785 Hyperlipidemia, unspecified: Secondary | ICD-10-CM | POA: Diagnosis not present

## 2015-08-29 DIAGNOSIS — K219 Gastro-esophageal reflux disease without esophagitis: Secondary | ICD-10-CM | POA: Diagnosis not present

## 2015-08-29 DIAGNOSIS — N183 Chronic kidney disease, stage 3 (moderate): Secondary | ICD-10-CM | POA: Diagnosis not present

## 2015-08-29 DIAGNOSIS — F17200 Nicotine dependence, unspecified, uncomplicated: Secondary | ICD-10-CM | POA: Diagnosis not present

## 2015-08-29 DIAGNOSIS — R634 Abnormal weight loss: Secondary | ICD-10-CM | POA: Diagnosis not present

## 2015-09-03 ENCOUNTER — Ambulatory Visit (INDEPENDENT_AMBULATORY_CARE_PROVIDER_SITE_OTHER): Payer: BLUE CROSS/BLUE SHIELD

## 2015-09-03 DIAGNOSIS — Z1231 Encounter for screening mammogram for malignant neoplasm of breast: Secondary | ICD-10-CM

## 2015-09-07 ENCOUNTER — Other Ambulatory Visit: Payer: Self-pay | Admitting: Family Medicine

## 2015-09-08 ENCOUNTER — Telehealth: Payer: Self-pay

## 2015-09-08 DIAGNOSIS — L600 Ingrowing nail: Secondary | ICD-10-CM

## 2015-09-08 NOTE — Telephone Encounter (Signed)
Patient was told she has an ingrown toe and needs referred to a podiatrist.  Referral placed.

## 2015-09-09 ENCOUNTER — Other Ambulatory Visit: Payer: Self-pay | Admitting: Family Medicine

## 2015-09-09 ENCOUNTER — Telehealth: Payer: Self-pay | Admitting: Family Medicine

## 2015-09-09 NOTE — Telephone Encounter (Signed)
She is already seeing GI and is supposed to have endoscopy. They should be able to decide if needs feeding tube, etc.

## 2015-09-09 NOTE — Telephone Encounter (Signed)
Received call from [employer]anch [facility]) regarding Pt. Was informed there was question regarding what could be done for the Pt at their office. According to Blanch Media, the Pt experiences pain when she eats therefore she does not want too. Did a trial run to see if lactose was a contributing factor, it was not. Blanch Media believed Pt may be a candidate for a feeding tube if she does not have any progress on eating. Recommended referral to a feeding specialist? Will route to PCP for review.

## 2015-09-17 ENCOUNTER — Ambulatory Visit (INDEPENDENT_AMBULATORY_CARE_PROVIDER_SITE_OTHER): Payer: BLUE CROSS/BLUE SHIELD | Admitting: Podiatry

## 2015-09-17 ENCOUNTER — Encounter: Payer: Self-pay | Admitting: Podiatry

## 2015-09-17 VITALS — BP 95/68 | HR 97 | Ht 67.0 in | Wt 95.0 lb

## 2015-09-17 DIAGNOSIS — M79606 Pain in leg, unspecified: Secondary | ICD-10-CM

## 2015-09-17 DIAGNOSIS — M79673 Pain in unspecified foot: Secondary | ICD-10-CM

## 2015-09-17 DIAGNOSIS — L600 Ingrowing nail: Secondary | ICD-10-CM | POA: Diagnosis not present

## 2015-09-17 DIAGNOSIS — M5416 Radiculopathy, lumbar region: Secondary | ICD-10-CM | POA: Insufficient documentation

## 2015-09-17 NOTE — Patient Instructions (Signed)
Seen for ingrown nails. All debrided. Return in 3 months or sooner if needed.

## 2015-09-17 NOTE — Progress Notes (Signed)
2 ingrown nails  For long time. Can not handle them to get any relief.   Pain in stomach x 1 year. No finding with endoscope.  Fibromyalgia x 6-7 years, hurts everywhere. Neuropathy on feet x 2 years. Carpal tunnel on right,  Stage III liver disease (poor function), Bipolar, Personality disorder, chest pain, urinary problem unable to empty bladder.  SUBJECTIVE: 54 y.o. year old female presents comlaining of painful toe nails x 2 months. Too painful and unable to handle them by herself.  REVIEW OF SYSTEMS: Constitutional: negative for chills, fevers, night sweats and sweats Eyes: negative Ears, nose, mouth, throat, and face: negative Respiratory: negative Cardiovascular: Frequent chest pain. Gastrointestinal: Stomach pain with unknown etiology, stage III liver disease. Genitourinary:Difficulty amptying bladder. Integument/breast: negative Musculoskeletal:Pain all over with fibromyalgia. Neurological: Positive for Bipolar disorder. Behavioral/Psych: Positive for personality disorder Endocrine: Negative. Allergic/Immunologic: negative  OBJECTIVE: DERMATOLOGIC EXAMINATION: Nails: Dystrophic nail at distal 1/2 with ingrown nail on medial and lateral borders of both great toes, symptomatic. No open skin or drainage noted.  VASCULAR EXAMINATION OF LOWER LIMBS: Pedal pulses: All pedal pulses are faintly palpable. Capillary Filling times within 3 seconds in all digits.  Temperature gradient from tibial crest to dorsum of foot is within normal bilateral. NEUROLOGIC EXAMINATION OF THE LOWER LIMBS: Failed to respond to Monofilament (Semmes-Weinstein 10-gm) sensory testing 6 out of 6, bilateral. Vibratory sensations(128Hz  turning fork) intact at medial and lateral forefoot bilateral.  Sharp and Dull discriminatory sensations at the plantar ball of hallux is intact bilateral.  MUSCULOSKELETAL EXAMINATION: No gross deformities noted.  ASSESSMENT: 1. Peripheral neuropathy. 2. Symptomatic  ingrown nails both great toes.  PLAN: Reviewed clinical findings and available treatment options. All nails debrided. Removed ingrown part of nail both great toes.

## 2015-10-09 DIAGNOSIS — R1013 Epigastric pain: Secondary | ICD-10-CM | POA: Diagnosis not present

## 2015-10-10 DIAGNOSIS — N289 Disorder of kidney and ureter, unspecified: Secondary | ICD-10-CM | POA: Diagnosis not present

## 2015-10-10 DIAGNOSIS — Z8679 Personal history of other diseases of the circulatory system: Secondary | ICD-10-CM | POA: Diagnosis not present

## 2015-10-10 DIAGNOSIS — N281 Cyst of kidney, acquired: Secondary | ICD-10-CM | POA: Diagnosis not present

## 2015-10-10 DIAGNOSIS — R109 Unspecified abdominal pain: Secondary | ICD-10-CM | POA: Diagnosis not present

## 2015-10-14 ENCOUNTER — Telehealth: Payer: Self-pay | Admitting: Family Medicine

## 2015-10-14 DIAGNOSIS — F4310 Post-traumatic stress disorder, unspecified: Secondary | ICD-10-CM

## 2015-10-14 DIAGNOSIS — F3170 Bipolar disorder, currently in remission, most recent episode unspecified: Secondary | ICD-10-CM

## 2015-10-14 DIAGNOSIS — F603 Borderline personality disorder: Secondary | ICD-10-CM

## 2015-10-14 NOTE — Telephone Encounter (Signed)
Pt called stating she doesn't like her current Psychiatrist and wants a referral to the Psych downstairs. Will route to PCP.

## 2015-10-15 ENCOUNTER — Other Ambulatory Visit: Payer: Self-pay | Admitting: Family Medicine

## 2015-10-15 NOTE — Telephone Encounter (Signed)
Referral placed, Pt notified.

## 2015-10-15 NOTE — Telephone Encounter (Signed)
Okay to place referral

## 2015-10-22 ENCOUNTER — Encounter: Payer: Self-pay | Admitting: Family Medicine

## 2015-10-22 ENCOUNTER — Ambulatory Visit (INDEPENDENT_AMBULATORY_CARE_PROVIDER_SITE_OTHER): Payer: BLUE CROSS/BLUE SHIELD | Admitting: Family Medicine

## 2015-10-22 VITALS — BP 83/59 | HR 95 | Wt 90.0 lb

## 2015-10-22 DIAGNOSIS — R636 Underweight: Secondary | ICD-10-CM | POA: Diagnosis not present

## 2015-10-22 DIAGNOSIS — Z681 Body mass index (BMI) 19 or less, adult: Secondary | ICD-10-CM

## 2015-10-22 DIAGNOSIS — R634 Abnormal weight loss: Secondary | ICD-10-CM

## 2015-10-22 DIAGNOSIS — Z91018 Allergy to other foods: Secondary | ICD-10-CM

## 2015-10-22 DIAGNOSIS — K5901 Slow transit constipation: Secondary | ICD-10-CM

## 2015-10-22 DIAGNOSIS — E039 Hypothyroidism, unspecified: Secondary | ICD-10-CM | POA: Diagnosis not present

## 2015-10-22 DIAGNOSIS — E46 Unspecified protein-calorie malnutrition: Secondary | ICD-10-CM

## 2015-10-22 LAB — TSH
TSH: 0.34 u[IU]/mL — AB (ref 0.41–5.90)
TSH: 0.34 u[IU]/mL — AB (ref 0.41–5.90)

## 2015-10-22 MED ORDER — LEVOTHYROXINE SODIUM 125 MCG PO TABS: ORAL_TABLET | ORAL | Status: DC

## 2015-10-22 NOTE — Progress Notes (Signed)
   Subjective:    Patient ID: Mandy Shaw, female    DOB: 27-Dec-1960, 55 y.o.   MRN: [id number]  HPI She has been seeing a nutritionist and has been for 3 sessions. Says she is getting some itching in the back of her throat and mouth when she eats. Says many foods are triggering this for here. Says occurs almost every time she eats. She has eliminated nuts and dairy and gluten.   Hypothyroid - has had some increased shakiness and palpitations. No pain over the gland.  Says occ has a hard time swallowing.  Says occ chocking and food is getting lodged. occ pills get stuck too. Says happens rarely.   Just had endoscopy last month that was normal.    GI had recommended miralax BID. She started that but then started having lower abdominal pain -   Review of Systems     Objective:   Physical Exam  Constitutional: She is oriented to person, place, and time. She appears well-developed and well-nourished.  HENT:  Head: Normocephalic and atraumatic.  Neck: Neck supple. No thyromegaly present.  Cardiovascular: Normal rate, regular rhythm and normal heart sounds.   Pulmonary/Chest: Effort normal and breath sounds normal.  Lymphadenopathy:    She has no cervical adenopathy.  Neurological: She is alert and oriented to person, place, and time.  Skin: Skin is warm and dry.  Psychiatric: She has a normal mood and affect. Her behavior is normal.          Assessment & Plan:  Underweight - she is now working with a nutritionist. Though she continues to lose weight even with the nutritionist. It sounds like now she has some concerns that she could be getting some food allergies. That she sometimes gets the itching in the back of her throat even when she doesn't eat so I'm not convinced this is necessarily food allergy. Though I did recommend further workup.  Abnormal weight loss- continue to work with nutritionist. Also continue to work with her psychiatrist. I do feel like some of this could be  related to her mental health.  Food allergy - offered to do allergy testing or refer her.  She decided to start with the blood test. We will order that today and called results once available. There are wondering if the itching in her throat is just related to allergic rhinitis more so than food allergy. I recommended a trial of an over-the-counter antihistamine such as Claritin, Zyrtec or Allegra.   Hypothyroid - we will need to call to get the recent thyroid level performed [facility]. I tried to find a result in care everywhere but could not find a recent thyroid level. The one that we did approximately 3 months ago, in October looked great at 1.2. I did go ahead and decrease her thyroid medication regimen 225 g daily and we will recheck her level in 6-8 weeks. She does not have any tenderness on exam to suspect thyroiditis.  Constipation-discussed working on hydrating well and that some people do have slow transit through the colon and may need to rely on something daily such as a softener or something like MiraLAX to keep her bowels moving.

## 2015-10-27 ENCOUNTER — Other Ambulatory Visit: Payer: Self-pay | Admitting: Family Medicine

## 2015-10-28 ENCOUNTER — Telehealth: Payer: Self-pay | Admitting: Family Medicine

## 2015-10-28 ENCOUNTER — Encounter: Payer: Self-pay | Admitting: Family Medicine

## 2015-10-28 NOTE — Telephone Encounter (Signed)
Pt advised of negative allergy test results. Verbalized understanding.

## 2015-10-29 ENCOUNTER — Telehealth: Payer: Self-pay | Admitting: Family Medicine

## 2015-10-29 ENCOUNTER — Encounter: Payer: Self-pay | Admitting: Family Medicine

## 2015-10-29 NOTE — Telephone Encounter (Signed)
I called patient and let her know she needs to come and get labs done before her next appointment. I printed labs and left at front desk drawer for her. Thanks, [employer]

## 2015-11-04 LAB — BASIC METABOLIC PANEL: CREATININE: 1.2 mg/dL — AB (ref 0.5–1.1)

## 2015-11-05 ENCOUNTER — Telehealth: Payer: Self-pay | Admitting: Family Medicine

## 2015-11-05 NOTE — Telephone Encounter (Signed)
Please call patient: Kidney function is stable from October.

## 2015-11-06 NOTE — Telephone Encounter (Signed)
Notified patient.

## 2015-11-07 ENCOUNTER — Encounter: Payer: Self-pay | Admitting: Family Medicine

## 2015-11-10 ENCOUNTER — Ambulatory Visit (HOSPITAL_COMMUNITY): Payer: BLUE CROSS/BLUE SHIELD | Admitting: Psychiatry

## 2015-11-10 NOTE — Telephone Encounter (Signed)
Patient states she also had a TSH check but it has not been resulted. She is waiting because she is out of thyroid medication. Please advise.

## 2015-11-10 NOTE — Telephone Encounter (Signed)
She should have a script for 172mcg. In the computer it was sent to [facility] on 10/22/15.  It was a 30 days upply with 1 RF.

## 2015-11-10 NOTE — Telephone Encounter (Signed)
Patient advised.

## 2015-11-21 ENCOUNTER — Ambulatory Visit (INDEPENDENT_AMBULATORY_CARE_PROVIDER_SITE_OTHER): Payer: 59 | Admitting: Psychiatry

## 2015-11-21 ENCOUNTER — Encounter (HOSPITAL_COMMUNITY): Payer: Self-pay | Admitting: Psychiatry

## 2015-11-21 VITALS — BP 92/56 | HR 65 | Ht 67.0 in | Wt 90.0 lb

## 2015-11-21 DIAGNOSIS — F4310 Post-traumatic stress disorder, unspecified: Secondary | ICD-10-CM

## 2015-11-21 DIAGNOSIS — F319 Bipolar disorder, unspecified: Secondary | ICD-10-CM | POA: Diagnosis not present

## 2015-11-21 DIAGNOSIS — F411 Generalized anxiety disorder: Secondary | ICD-10-CM

## 2015-11-21 DIAGNOSIS — G4700 Insomnia, unspecified: Secondary | ICD-10-CM

## 2015-11-21 MED ORDER — TRAZODONE HCL 100 MG PO TABS: 100.0000 mg | ORAL_TABLET | Freq: Every day | ORAL | Status: DC

## 2015-11-21 MED ORDER — BUPROPION HCL ER (SR) 100 MG PO TB12: 100.0000 mg | ORAL_TABLET | Freq: Every day | ORAL | Status: DC
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
## 2015-11-21 NOTE — Patient Instructions (Signed)
Start tapering down valium 5mg  per week. Discussed risk and tolerance Start wellbutrin 100mg  qd for depression Increase trazadone 100mg  for sleep Avoid naps during the day or sleep during the day Consider alternative to topomax as it may cause weight loss

## 2015-11-27 ENCOUNTER — Ambulatory Visit: Payer: Self-pay | Admitting: Family Medicine

## 2015-12-02 ENCOUNTER — Encounter: Payer: Self-pay | Admitting: Family Medicine

## 2015-12-02 ENCOUNTER — Ambulatory Visit (INDEPENDENT_AMBULATORY_CARE_PROVIDER_SITE_OTHER): Payer: BLUE CROSS/BLUE SHIELD | Admitting: Family Medicine

## 2015-12-02 VITALS — BP 79/42 | HR 87 | Wt 92.0 lb

## 2015-12-02 DIAGNOSIS — R195 Other fecal abnormalities: Secondary | ICD-10-CM

## 2015-12-02 DIAGNOSIS — E038 Other specified hypothyroidism: Secondary | ICD-10-CM | POA: Diagnosis not present

## 2015-12-02 DIAGNOSIS — Z720 Tobacco use: Secondary | ICD-10-CM | POA: Diagnosis not present

## 2015-12-02 DIAGNOSIS — R636 Underweight: Secondary | ICD-10-CM

## 2015-12-02 LAB — TSH: TSH: 1.21 u[IU]/mL (ref 0.41–5.90)

## 2015-12-02 NOTE — Patient Instructions (Signed)
Recommend an OTC for Ex -Lax ( stimulant laxative ) for 2 days to get the bowels moving.

## 2015-12-02 NOTE — Progress Notes (Signed)
   Subjective:    Patient ID: Mandy Shaw, female    DOB: 1961/02/18, 55 y.o.   MRN: [id number]  HPI  Hypothyroid - we dec her dose about 6 weeks and she has noticed she is not cold all the time.  She does feel ike the adjustement has helped. She is up 2 lbs.    She has been constipated. She takes Miralax BID and she is still struggling.  No having some lower pelvic pain.  Stool is soft, but just not going daily.  She says she has noticed something "swarming" in her stool last week. She feels like it sstarted after she ate some Organic Bananas.  No blood in the stool. Some tiching after BM.  No camping.  No travel outside the country.    She has noticed an itchy spot on the top of her throat and has itching in her nose and her right eye.  Occ has pain behind that eye and occ has pressure in 2 teeth on the upper right jaw.  She really doesn't go outside.    She quit smoking in February.    Review of Systems     Objective:   Physical Exam  Constitutional: She is oriented to person, place, and time. She appears well-developed and well-nourished.  HENT:  Head: Normocephalic and atraumatic.  Neck: Neck supple. No thyromegaly present.  Cardiovascular: Normal rate, regular rhythm and normal heart sounds.   Pulmonary/Chest: Effort normal and breath sounds normal.  Lymphadenopathy:    She has no cervical adenopathy.  Neurological: She is alert and oriented to person, place, and time.  Skin: Skin is warm and dry.  Psychiatric: She has a normal mood and affect. Her behavior is normal.          Assessment & Plan:  Marland Kitchen Hypothyroidism-we decreased her dose about 6 weeks ago. Due to recheck today and hopefully looks great this time. She is feeling better which is great. Next  Underweight-she's been getting with the nutritionist and she is up 2 pounds today which is fantastic.   tobacco abuse-she quit smoking in February and so far is doing great with that.   constipation-she is moving her  bowels some distal sinus attaches she was previously. Continue with MiraLAX and encouraged her to add a stimulant laxative for a couple days to get things moving. Next  Abnormal stool-she is really low risk overall for parasites or ova. We'll start with a stool culture.

## 2015-12-05 ENCOUNTER — Telehealth: Payer: Self-pay | Admitting: Family Medicine

## 2015-12-05 ENCOUNTER — Encounter: Payer: Self-pay | Admitting: Family Medicine

## 2015-12-05 NOTE — Telephone Encounter (Signed)
Pt advised of results. Verbalized understanding.   Pt also states she is taking the MiraLAX BID with a stool softener but still unable to have a BM. Will route to PCP for further recommendation.

## 2015-12-05 NOTE — Telephone Encounter (Signed)
Please call patient: Her TSH/thyroid level was 1.2 which is absolutely perfect.

## 2015-12-06 DIAGNOSIS — E785 Hyperlipidemia, unspecified: Secondary | ICD-10-CM | POA: Diagnosis not present

## 2015-12-06 DIAGNOSIS — K219 Gastro-esophageal reflux disease without esophagitis: Secondary | ICD-10-CM | POA: Diagnosis not present

## 2015-12-06 DIAGNOSIS — Z882 Allergy status to sulfonamides status: Secondary | ICD-10-CM | POA: Diagnosis not present

## 2015-12-06 DIAGNOSIS — Z91018 Allergy to other foods: Secondary | ICD-10-CM | POA: Diagnosis not present

## 2015-12-06 DIAGNOSIS — J449 Chronic obstructive pulmonary disease, unspecified: Secondary | ICD-10-CM | POA: Diagnosis not present

## 2015-12-06 DIAGNOSIS — Z886 Allergy status to analgesic agent status: Secondary | ICD-10-CM | POA: Diagnosis not present

## 2015-12-06 DIAGNOSIS — K5900 Constipation, unspecified: Secondary | ICD-10-CM | POA: Diagnosis not present

## 2015-12-06 DIAGNOSIS — R1111 Vomiting without nausea: Secondary | ICD-10-CM | POA: Diagnosis not present

## 2015-12-06 DIAGNOSIS — K2970 Gastritis, unspecified, without bleeding: Secondary | ICD-10-CM | POA: Diagnosis not present

## 2015-12-06 DIAGNOSIS — Z883 Allergy status to other anti-infective agents status: Secondary | ICD-10-CM | POA: Diagnosis not present

## 2015-12-06 DIAGNOSIS — Z880 Allergy status to penicillin: Secondary | ICD-10-CM | POA: Diagnosis not present

## 2015-12-06 DIAGNOSIS — Z79899 Other long term (current) drug therapy: Secondary | ICD-10-CM | POA: Diagnosis not present

## 2015-12-07 DIAGNOSIS — Z888 Allergy status to other drugs, medicaments and biological substances status: Secondary | ICD-10-CM | POA: Diagnosis not present

## 2015-12-07 DIAGNOSIS — E860 Dehydration: Secondary | ICD-10-CM | POA: Diagnosis not present

## 2015-12-07 DIAGNOSIS — R51 Headache: Secondary | ICD-10-CM | POA: Diagnosis not present

## 2015-12-07 DIAGNOSIS — K5909 Other constipation: Secondary | ICD-10-CM | POA: Diagnosis not present

## 2015-12-07 DIAGNOSIS — R1013 Epigastric pain: Secondary | ICD-10-CM | POA: Diagnosis not present

## 2015-12-07 DIAGNOSIS — K8050 Calculus of bile duct without cholangitis or cholecystitis without obstruction: Secondary | ICD-10-CM | POA: Diagnosis not present

## 2015-12-07 DIAGNOSIS — K5900 Constipation, unspecified: Secondary | ICD-10-CM | POA: Diagnosis not present

## 2015-12-07 DIAGNOSIS — K831 Obstruction of bile duct: Secondary | ICD-10-CM | POA: Diagnosis not present

## 2015-12-07 DIAGNOSIS — M797 Fibromyalgia: Secondary | ICD-10-CM | POA: Diagnosis not present

## 2015-12-07 DIAGNOSIS — R945 Abnormal results of liver function studies: Secondary | ICD-10-CM | POA: Diagnosis not present

## 2015-12-07 DIAGNOSIS — K838 Other specified diseases of biliary tract: Secondary | ICD-10-CM | POA: Diagnosis not present

## 2015-12-07 DIAGNOSIS — R109 Unspecified abdominal pain: Secondary | ICD-10-CM | POA: Diagnosis not present

## 2015-12-07 DIAGNOSIS — Z87891 Personal history of nicotine dependence: Secondary | ICD-10-CM | POA: Diagnosis not present

## 2015-12-07 DIAGNOSIS — Z79899 Other long term (current) drug therapy: Secondary | ICD-10-CM | POA: Diagnosis not present

## 2015-12-07 DIAGNOSIS — K8030 Calculus of bile duct with cholangitis, unspecified, without obstruction: Secondary | ICD-10-CM | POA: Diagnosis not present

## 2015-12-07 DIAGNOSIS — E039 Hypothyroidism, unspecified: Secondary | ICD-10-CM | POA: Diagnosis not present

## 2015-12-07 DIAGNOSIS — J449 Chronic obstructive pulmonary disease, unspecified: Secondary | ICD-10-CM | POA: Diagnosis not present

## 2015-12-07 NOTE — Telephone Encounter (Signed)
Recommend OTC magnesium citrate. Store brand is fine. Follow instructions on the bottle.

## 2015-12-08 NOTE — Telephone Encounter (Signed)
Called and spoke with Pt. She is currently inpatient at [facility]. Pt went to [facility] ED (records were given to PCP) on Saturday, then Pt went to [facility] ED on Sunday. Pt was able to pass some stool on Saturday but when her abdominal pain return she went to be evaluated. Advised Pt I would get information to PCP and she can look via Care Everywhere at the imaging completed at [place] and possibly [place]. No further questions.

## 2015-12-10 ENCOUNTER — Other Ambulatory Visit: Payer: Self-pay | Admitting: Family Medicine

## 2015-12-12 ENCOUNTER — Telehealth: Payer: Self-pay

## 2015-12-12 DIAGNOSIS — R748 Abnormal levels of other serum enzymes: Secondary | ICD-10-CM | POA: Insufficient documentation

## 2015-12-12 NOTE — Telephone Encounter (Signed)
Mandy Shaw is in the hospital and her TSH was elevated. So they increased the medication to 150 mcg. She is concerned that this is too much for her. She states she just had her TSH check recently and it was normal.

## 2015-12-12 NOTE — Telephone Encounter (Signed)
Patient advised of recommendations.  

## 2015-12-12 NOTE — Telephone Encounter (Signed)
I agree, it looked perfect about a week ago. I would stick with the 125 g for now we can always recheck her thyroid in 3-4 weeks.

## 2015-12-16 ENCOUNTER — Ambulatory Visit (INDEPENDENT_AMBULATORY_CARE_PROVIDER_SITE_OTHER): Payer: Medicare Other | Admitting: Podiatry

## 2015-12-16 ENCOUNTER — Encounter: Payer: Self-pay | Admitting: Podiatry

## 2015-12-16 DIAGNOSIS — B351 Tinea unguium: Secondary | ICD-10-CM | POA: Diagnosis not present

## 2015-12-16 DIAGNOSIS — L600 Ingrowing nail: Secondary | ICD-10-CM

## 2015-12-16 DIAGNOSIS — M79673 Pain in unspecified foot: Secondary | ICD-10-CM

## 2015-12-16 DIAGNOSIS — M79606 Pain in leg, unspecified: Secondary | ICD-10-CM

## 2015-12-16 NOTE — Progress Notes (Signed)
Was in hospital for stomach issues. SUBJECTIVE: 55 y.o. year old female presents comlaining of painful toe nails and requests trimming.   OBJECTIVE: DERMATOLOGIC EXAMINATION: Nails: Dystrophic nail at distal 1/2 with ingrown nail on medial and lateral borders of both great toes, symptomatic. No open skin or drainage noted.  VASCULAR EXAMINATION OF LOWER LIMBS: Pedal pulses: All pedal pulses are faintly palpable. Capillary Filling times within 3 seconds in all digits.  Temperature gradient from tibial crest to dorsum of foot is within normal bilateral. NEUROLOGIC EXAMINATION OF THE LOWER LIMBS: Failed to respond to Monofilament (Semmes-Weinstein 10-gm) sensory testing 6 out of 6, bilateral. Vibratory sensations(128Hz  turning fork) intact at medial and lateral forefoot bilateral.  Sharp and Dull discriminatory sensations at the plantar ball of hallux is intact bilateral.  MUSCULOSKELETAL EXAMINATION: No gross deformities noted.  ASSESSMENT: 1. Peripheral neuropathy. 2. Symptomatic ingrown nails both great toes.  PLAN: Reviewed clinical findings and available treatment options. All nails debrided. Removed ingrown part of nail both great toes.

## 2015-12-16 NOTE — Patient Instructions (Signed)
Seen for hypertrophic nails. All nails debrided. Return in 3 months or as needed.  

## 2015-12-22 ENCOUNTER — Ambulatory Visit (INDEPENDENT_AMBULATORY_CARE_PROVIDER_SITE_OTHER): Payer: 59 | Admitting: Psychiatry

## 2015-12-22 ENCOUNTER — Encounter (HOSPITAL_COMMUNITY): Payer: Self-pay | Admitting: Psychiatry

## 2015-12-22 VITALS — BP 90/62 | HR 68 | Ht 67.0 in | Wt 93.6 lb

## 2015-12-22 DIAGNOSIS — F411 Generalized anxiety disorder: Secondary | ICD-10-CM | POA: Diagnosis not present

## 2015-12-22 DIAGNOSIS — F319 Bipolar disorder, unspecified: Secondary | ICD-10-CM | POA: Diagnosis not present

## 2015-12-22 DIAGNOSIS — G4700 Insomnia, unspecified: Secondary | ICD-10-CM | POA: Diagnosis not present

## 2015-12-22 DIAGNOSIS — F4310 Post-traumatic stress disorder, unspecified: Secondary | ICD-10-CM

## 2015-12-22 MED ORDER — TRAZODONE HCL 100 MG PO TABS: 100.0000 mg | ORAL_TABLET | Freq: Every day | ORAL | Status: DC

## 2015-12-22 MED ORDER — DULOXETINE HCL 60 MG PO CPEP: 120.0000 mg | ORAL_CAPSULE | Freq: Every day | ORAL | Status: DC

## 2015-12-22 MED ORDER — LAMOTRIGINE 25 MG PO TABS: 25.0000 mg | ORAL_TABLET | Freq: Every day | ORAL | Status: DC
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
## 2015-12-23 ENCOUNTER — Ambulatory Visit (INDEPENDENT_AMBULATORY_CARE_PROVIDER_SITE_OTHER): Payer: BLUE CROSS/BLUE SHIELD | Admitting: Family Medicine

## 2015-12-23 ENCOUNTER — Telehealth: Payer: Self-pay | Admitting: Family Medicine

## 2015-12-23 ENCOUNTER — Encounter: Payer: Self-pay | Admitting: Family Medicine

## 2015-12-23 VITALS — BP 87/50 | HR 79 | Wt 92.4 lb

## 2015-12-23 DIAGNOSIS — I809 Phlebitis and thrombophlebitis of unspecified site: Secondary | ICD-10-CM | POA: Diagnosis not present

## 2015-12-23 DIAGNOSIS — F3170 Bipolar disorder, currently in remission, most recent episode unspecified: Secondary | ICD-10-CM

## 2015-12-23 DIAGNOSIS — K5900 Constipation, unspecified: Secondary | ICD-10-CM | POA: Diagnosis not present

## 2015-12-23 DIAGNOSIS — G43909 Migraine, unspecified, not intractable, without status migrainosus: Secondary | ICD-10-CM

## 2015-12-23 DIAGNOSIS — E46 Unspecified protein-calorie malnutrition: Secondary | ICD-10-CM | POA: Diagnosis not present

## 2015-12-23 DIAGNOSIS — K5909 Other constipation: Secondary | ICD-10-CM

## 2015-12-23 MED ORDER — LUBIPROSTONE 8 MCG PO CAPS: 8.0000 ug | ORAL_CAPSULE | Freq: Two times a day (BID) | ORAL | Status: DC

## 2015-12-23 MED ORDER — TOPIRAMATE ER 25 MG PO CAP24: ORAL_CAPSULE | ORAL | Status: DC

## 2015-12-23 MED ORDER — OMEPRAZOLE 40 MG PO CPDR: 40.0000 mg | DELAYED_RELEASE_CAPSULE | Freq: Two times a day (BID) | ORAL | Status: DC

## 2015-12-23 NOTE — Progress Notes (Signed)
Subjective:    Patient ID: Mandy Shaw, female    DOB: 12-07-1960, 55 y.o.   MRN: [id number]  HPI  Patient comes in today for hospital follow-up. She had an episode of severe constipation and went to the emergency department. She has a history of chronic constipation. He presented on March 19 with upper abdominal pain radiating to her back. Her liver functions were mildly elevated at that time. She had a CT MRI which showed some biliary dilatation. She had an ERCP with Dr. weeks there was some papillary stenosis noted. She otherwise did well and only complained of some intermittent crampy abdominal pain while there. They put her on a regimen of MiraLAX which she had taken previously. She says that she takes it twice a day it does not cause a bowel movement. She has to take a third dose before she sees any results. He also discharged her home on Bentyl. She says she doesn't like how it's making her feel. She says it makes her feel more confused mentally. Is causing facial flushing and feels like it's increasing her irritability. If she could have irritable bowel syndrome, constipation predominant. Next  Migraine headaches-her psychiatrist and recommended that she come off of the Topamax because of excessive weight loss. She also complains today of numbness and tingling in her fingertips and hands that becomes painful at times. Next  She also complains of soreness in her forearms. She had blood draws and IVs placed about 3 weeks ago while she was in the hospital and says it has been very tender and sore since then. She feels like she's getting some stiffness in her hands. She says when she puts pressure on the tips of her fingers it causes a shooting pain that radiates from her forearms up into her shoulder and her neck all the way up to her ears bilaterally.  Bipolar disorder-they have stopped her Wellbutrin and she started Lamictal this morning.  Review of Systems  BP 87/50 mmHg  Pulse 79  Wt 92  lb 6.4 oz (41.912 kg)  SpO2 100%  LMP 06/20/2014    Allergies  Allergen Reactions  . Other Anaphylaxis    MANGO, PARSLEY.  Clancy Gourd [Nitrofurantoin Macrocrystal] Itching  . Neurontin [Gabapentin]   . Penicillins     REACTION: Anaphalatic  . Sulfonamide Derivatives     REACTION: Anaphalatic    Past Medical History  Diagnosis Date  . COPD (chronic obstructive pulmonary disease) ([place])   . Thyroid disease   . Insomnia   . GERD (gastroesophageal reflux disease)   . Arthritis   . Bipolar 1 disorder ([place])   . Personality disorder   . 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 Cesarean section    . Cholecystectomy    . Thyroidectomy    . Leep    . Incontinence surgery    . Eye surgery    . Induced abortion    . Cardiac surgery      flap on heart removed  . Laparotomy      exploratory for pain  . Condyloma excision/fulguration    . Right hip surgery  09/26/2013    Dr. Arnette Norris  . Left hip surgery  01/09/14    Dr. Arnette Norris  . Shoulder surgery Right 2015    Dr. Margretta Sidle    Social History   Social History  . Marital Status: Married  Spouse Name: N/A  . Number of Children: 1  . Years of Education: N/A   Occupational History  . disabled    Social History Main Topics  . Smoking status: Former Smoker -- 1.50 packs/day for 40 years    Types: Cigarettes    Quit date: 10/22/2015  . Smokeless tobacco: Never Used  . Alcohol Use: 0.0 oz/week    0 Standard drinks or equivalent per week     Comment: socially  . Drug Use: No  . Sexual Activity:    Partners: Male     Comment: married, 1 daughter, 1  1/2 yrs college, self-employed, no reg exercise, 3 caffeine drinks daily.   Other Topics Concern  . Not on file   Social History Narrative    Family History  Problem Relation Age of Onset  . Alcohol abuse Father     Outpatient Encounter Prescriptions as of 12/23/2015  Medication  Sig  . BIOTIN PO Take by mouth.  . Calcium Carb-Cholecalciferol (CALCIUM 600 + D PO) Take by mouth.  Sarajane Marek Sodium 30-100 MG CAPS Take by mouth at bedtime. Take 3 pills in pm  . diphenhydrAMINE (BENADRYL) 25 mg capsule Take 25 mg by mouth at bedtime and may repeat dose one time if needed.   . diphenhydramine-acetaminophen (TYLENOL PM) 25-500 MG TABS tablet Take 2 tablets by mouth at bedtime.   . DULoxetine (CYMBALTA) 60 MG capsule Take 2 capsules (120 mg total) by mouth daily.  Marland Kitchen eletriptan (RELPAX) 40 MG tablet 40 mg 2 (two) times daily.   Marland Kitchen lamoTRIgine (LAMICTAL) 25 MG tablet Take 1 tablet (25 mg total) by mouth daily. Take one tablet daily for a week and then start taking 2 tablets.  Marland Kitchen levothyroxine (SYNTHROID, LEVOTHROID) 125 MCG tablet take 1 tablet by mouth once daily  . omega-3 acid ethyl esters (LOVAZA) 1 g capsule take 4 capsules by mouth once daily  . omeprazole (PRILOSEC) 40 MG capsule Take 1 capsule (40 mg total) by mouth 2 (two) times daily.  . primidone (MYSOLINE) 50 MG tablet Take 50 mg by mouth 2 (two) times daily.  . simvastatin (ZOCOR) 20 MG tablet take 1 tablet by mouth daily  . traZODone (DESYREL) 100 MG tablet Take 100 mg by mouth at bedtime.  . Vitamins/Minerals TABS Take 1 tablet by mouth.  . [DISCONTINUED] dicyclomine (BENTYL) 10 MG capsule Take by mouth.  . [DISCONTINUED] omeprazole (PRILOSEC) 40 MG capsule take 1 capsule by mouth twice a day  . [DISCONTINUED] Topiramate ER (TROKENDI XR) 200 MG CP24 1 capsule 2 (two) times daily.   . [DISCONTINUED] traZODone (DESYREL) 50 MG tablet Take 50 mg by mouth at bedtime.  Marland Kitchen lubiprostone (AMITIZA) 8 MCG capsule Take 1 capsule (8 mcg total) by mouth 2 (two) times daily with a meal.  . Topiramate ER 25 MG CP24 2 tabs po QD x 5 days, then one a day x 5 days.  . [DISCONTINUED] diazepam (VALIUM) 5 MG tablet Take 5 mg by mouth 3 (three) times daily. 2 tabs qhs prn  . [DISCONTINUED] traZODone (DESYREL) 100 MG tablet  Take 1 tablet (100 mg total) by mouth at bedtime.   No facility-administered encounter medications on file as of 12/23/2015.          Objective:   Physical Exam  Constitutional: She is oriented to person, place, and time. She appears well-developed.  Very thin female.   HENT:  Head: Normocephalic and atraumatic.  Cardiovascular: Normal rate, regular rhythm and normal heart sounds.  Pulmonary/Chest: Effort normal and breath sounds normal.  Neurological: She is alert and oriented to person, place, and time.  Skin: Skin is warm and dry.  Extensive bruising on the forearms bilaterally. No inc warmth or redness.  Tender over the veins.   Psychiatric: She has a normal mood and affect. Her behavior is normal.          Assessment & Plan:  Chronic constipation-consider one of the IBS see drugs such as Linzess or Amitiza. Discussed how these medications work.  Migraine headaches-we'll work on weaning her Topamax. I think this could definitely be contributing to her weight loss and the numbness and tingling that she is experiencing in her fingertips and hands. She is currently on trach can be. We'll decrease down to 1 tab per day and then we'll switch to 50 mg and then down to 25 mg and then discontinue. She wants to be on something in its place. Not a candidate for TCA and intolerance to gabapentin.    Phlebitis of bilateral forearms-we'll treat with compression an anti-inflammatory.  bipolar 1 disorder-following with psychiatry. Recent medication change. Off Wellbutrin and starting Lamictal.

## 2015-12-23 NOTE — Patient Instructions (Addendum)
Okay to take naproxen, one twice a day for 5 days for inflammation in the blood vessels in your arms.Keep wrapped for 10 days.   Decrease Topamax to once a day for 7 days. I will send over new perception for 25 mg to take 2 for 5 days and then 1 for 5 days and then stop. Ok to stop Bentyl.

## 2015-12-23 NOTE — Telephone Encounter (Signed)
Call patient: Currently reviewed all the current options on the market for migraines. I am not able to come up with anything right now that would work well to take place of the Topamax. I would like to refer her to [facility]. 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 I like to get her to do a consult with you to see what we can come up with. If she is okay with the referral. Okay to place referral.

## 2015-12-24 NOTE — Telephone Encounter (Signed)
Pt informed and would like to move forward with referral.Mandy Shaw, Mandy Shaw

## 2015-12-25 ENCOUNTER — Telehealth: Payer: Self-pay

## 2015-12-25 NOTE — Telephone Encounter (Signed)
Mandy Shaw states she ace bandages makes her arms hurt worse. Please advise.

## 2015-12-25 NOTE — Telephone Encounter (Signed)
Ok to stop them .

## 2015-12-25 NOTE — Telephone Encounter (Signed)
Patient advised of recommendations.  

## 2015-12-29 ENCOUNTER — Telehealth (HOSPITAL_COMMUNITY): Payer: Self-pay | Admitting: Psychiatry

## 2015-12-29 ENCOUNTER — Encounter: Payer: Self-pay | Admitting: Osteopathic Medicine

## 2015-12-29 ENCOUNTER — Telehealth: Payer: Self-pay

## 2015-12-29 ENCOUNTER — Ambulatory Visit (INDEPENDENT_AMBULATORY_CARE_PROVIDER_SITE_OTHER): Payer: BLUE CROSS/BLUE SHIELD | Admitting: Osteopathic Medicine

## 2015-12-29 VITALS — BP 88/60 | HR 92 | Ht 67.0 in | Wt 96.0 lb

## 2015-12-29 DIAGNOSIS — M62838 Other muscle spasm: Secondary | ICD-10-CM | POA: Diagnosis not present

## 2015-12-29 LAB — BASIC METABOLIC PANEL
BUN: 24 mg/dL — AB (ref 4–21)
Creatinine: 1.2 mg/dL — AB (ref <=1.1)
Glucose: 87 mg/dL
POTASSIUM: 4.4 mmol/L (ref 3.4–5.3)
SODIUM: 141 mmol/L (ref 137–147)

## 2015-12-29 LAB — CBC AND DIFFERENTIAL
HEMATOCRIT: 39 % (ref 36–46)
HEMOGLOBIN: 12.8 g/dL (ref 12.0–16.0)
Platelets: 180 10*3/uL (ref 150–399)
WBC: 5.1 10^3/mL

## 2015-12-29 LAB — HEPATIC FUNCTION PANEL
ALT: 47 U/L — AB (ref 7–35)
AST: 28 U/L (ref 13–35)
Alkaline Phosphatase: 93 U/L (ref 25–125)

## 2015-12-29 LAB — TSH: TSH: 0.94 u[IU]/mL (ref <=5.90)

## 2015-12-29 NOTE — Telephone Encounter (Signed)
Tylenol pm already had benadryl in it so she has been taking 100mg  of benadryl. She need to not take more than 50mg  total. Ok to use the tylenol PM but not the extra Benadryl.

## 2015-12-29 NOTE — Patient Instructions (Addendum)
PLEASE FOLLOW UP WITH YOUR PSYCHIATRIST REGARDING YOUR MEDICATIONS AND POSSIBLE SIDE EFFECTS.  SEE DR. Madilyn Fireman IN 1 WEEK TO RECHECK YOUR SYMPTOMS AND FOLLOW UP ON THIS ISSUE RETURN TO CLINIC SOONER IF NEEDED. IN EMERGENCY, GO TO ER ASAP!   These are symptoms of a stroke -  SEEK IMMEDIATE MEDICAL CARE IF:   You have sudden weakness or numbness of the face, arm, or leg, especially on one side of the body.  Your face or eyelid droops to one side.  You have sudden confusion.  You have trouble speaking (aphasia) or understanding.  You have sudden trouble seeing in one or both eyes.  You have sudden trouble walking.  You have dizziness.  You have a loss of balance or coordination.  You have a sudden, severe headache with no known cause.  You have new chest pain or an irregular heartbeat. Any of these symptoms may represent a serious problem that is an emergency. Do not wait to see if the symptoms will go away. Get medical help at once. Call your local emergency services (911 in [place] Do not drive yourself to the hospital.   This information is not intended to replace advice given to you by your health care provider. Make sure you discuss any questions you have with your health care provider.   Document Released: 10/14/2004 Document Revised: 09/27/2014 Document Reviewed: 03/09/2013 Elsevier Interactive Patient Education [employer].

## 2015-12-29 NOTE — Progress Notes (Signed)
HPI: Mandy Shaw is a 55 y.o. female who presents to [facility] today for chief complaint of:  Chief Complaint  Patient presents with  . Muscle Pain    Patient stated that she took some herbal tea last night and her muscles started twicting afterwards      Location: generalized Quality: muscle tightness and discomfort. Felt like corners of her mouth were being pulled down - muscles were tense all over her body. R eye feels numb.  Duration: 1 day Timing: noticed after drinking some herbal tea Context: quit sleep supplements (was on total Benadryl 100mg  w/ Benadryl plus Tylenol PM) Modifying factors: She quit the Tylenol PM and then couldn't sleep so she tried some herbal tea to help, as well as "natual sleep aid" (with Tryptophan, Glycine and Melatonin, also had Valerian Root, Chamomile, Hope, Passionflower, Skullcap Root extract, and GABA). Also took some herbal tea (tea contains Valerian, passionflower, Licorice, [place], Chamomile, Skullcap, Cardamom, Cinnamon, [employer], Rose hip, [place], [place]). She also recently started Lamictal per psych.  Assoc signs/symptoms: no unilateral weakness, no slurred speech, no vision changes  Records reviewed and discussed this patient with Dr. Madilyn Fireman who last saw her 12/23/15, recently put on Lamictal per Psych, concern for weight loss and backing off on Topamax per Dr. Madilyn Fireman. "She is recently experiencing some numbness of the hands and also some jaw slow movements for the last few days no other involuntary movements noticeable." Per psych note 12/22/15.    Past medical, social and family history reviewed: Past Medical History  Diagnosis Date  . COPD (chronic obstructive pulmonary disease) ([place])   . Thyroid disease   . Insomnia   . GERD (gastroesophageal reflux disease)   . Arthritis   . Bipolar 1 disorder ([place])   . Personality disorder   . 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 Cesarean section    . Cholecystectomy    . Thyroidectomy    . Leep    . Incontinence surgery    . Eye surgery    . Induced abortion    . Cardiac surgery      flap on heart removed  . Laparotomy      exploratory for pain  . Condyloma excision/fulguration    . Right hip surgery  09/26/2013    Dr. Arnette Norris  . Left hip surgery  01/09/14    Dr. Arnette Norris  . Shoulder surgery Right 2015    Dr. Margretta Sidle   Social History  Substance Use Topics  . Smoking status: Former Smoker -- 1.50 packs/day for 40 years    Types: Cigarettes    Quit date: 10/22/2015  . Smokeless tobacco: Never Used  . Alcohol Use: 0.0 oz/week    0 Standard drinks or equivalent per week     Comment: socially   Family History  Problem Relation Age of Onset  . Alcohol abuse Father     Current Outpatient Prescriptions  Medication Sig Dispense Refill  . BIOTIN PO Take by mouth.    . Calcium Carb-Cholecalciferol (CALCIUM 600 + D PO) Take by mouth.    Sarajane Marek Sodium 30-100 MG CAPS Take by mouth at bedtime. Take 3 pills in pm    . diphenhydrAMINE (BENADRYL) 25 mg capsule Take 25 mg by mouth at bedtime and may repeat dose one time if needed.     . diphenhydramine-acetaminophen (TYLENOL PM)  25-500 MG TABS tablet Take 2 tablets by mouth at bedtime.     . DULoxetine (CYMBALTA) 60 MG capsule Take 2 capsules (120 mg total) by mouth daily. 60 capsule 0  . eletriptan (RELPAX) 40 MG tablet 40 mg 2 (two) times daily.     Marland Kitchen lamoTRIgine (LAMICTAL) 25 MG tablet Take 1 tablet (25 mg total) by mouth daily. Take one tablet daily for a week and then start taking 2 tablets. 60 tablet 0  . levothyroxine (SYNTHROID, LEVOTHROID) 125 MCG tablet take 1 tablet by mouth once daily 30 tablet 1  . lubiprostone (AMITIZA) 8 MCG capsule Take 1 capsule (8 mcg total) by mouth 2 (two) times daily with a meal. 60 capsule 1  . omega-3 acid ethyl esters (LOVAZA) 1 g  capsule take 4 capsules by mouth once daily 120 capsule 11  . omeprazole (PRILOSEC) 40 MG capsule Take 1 capsule (40 mg total) by mouth 2 (two) times daily. 60 capsule 4  . primidone (MYSOLINE) 50 MG tablet Take 50 mg by mouth 2 (two) times daily.    . simvastatin (ZOCOR) 20 MG tablet take 1 tablet by mouth daily 90 tablet 1  . Topiramate ER 25 MG CP24 2 tabs po QD x 5 days, then one a day x 5 days. 15 capsule 0  . traZODone (DESYREL) 100 MG tablet Take 100 mg by mouth at bedtime.  0  . Vitamins/Minerals TABS Take 1 tablet by mouth.    . [DISCONTINUED] buPROPion (WELLBUTRIN SR) 100 MG 12 hr tablet Take 1 tablet (100 mg total) by mouth daily. 30 tablet 0  . [DISCONTINUED] calcium carbonate (OS-CAL) 600 MG TABS Take by mouth. Take 2 tabs daily    . [DISCONTINUED] propranolol (INDERAL) 40 MG tablet Take 1 tablet (40 mg total) by mouth 2 (two) times daily. 60 tablet 1  . [DISCONTINUED] QUEtiapine (SEROQUEL) 400 MG tablet Take 400 mg by mouth at bedtime.      . [DISCONTINUED] SUMAtriptan (IMITREX) 100 MG tablet Take 100 mg by mouth every 2 (two) hours as needed.     . [DISCONTINUED] thiothixene (NAVANE) 2 MG capsule Take 2 mg by mouth 3 (three) times daily.      . [DISCONTINUED] ziprasidone (GEODON) 60 MG capsule Take 60 mg by mouth 3 (three) times daily.       No current facility-administered medications for this visit.   Allergies  Allergen Reactions  . Other Anaphylaxis    MANGO, PARSLEY.  Clancy Gourd [Nitrofurantoin Macrocrystal] Itching  . Neurontin [Gabapentin]   . Penicillins     REACTION: Anaphalatic  . Sulfonamide Derivatives     REACTION: Anaphalatic      Review of Systems: CONSTITUTIONAL:  No  fever, no chills, HEAD/EYES/EARS/NOSE/THROAT: (+) headache, no vision change, no hearing change, No  sore throat, No  sinus pressure CARDIAC: No  chest pain, No  pressure, No palpitations RESPIRATORY: No  cough, No  shortness of breath/wheeze GASTROINTESTINAL: No  nausea, No   vomiting, No  abdominal pain, MUSCULOSKELETAL: (+) muscle tension, no myalgia/arthralgia NEUROLOGIC: No  weakness, No  dizziness, No  slurred speech PSYCHIATRIC: (+) concerns with depression, (+) concerns with anxiety, (+) sleep problems  Exam:  BP 88/60 mmHg  Pulse 92  Ht 5\' 7"  (1.702 m)  Wt 96 lb (43.545 kg)  BMI 15.03 kg/m2  LMP 06/20/2014 Constitutional: VS see above. General Appearance: alert, thin/frail, NAD Eyes: Normal lids and conjunctive, non-icteric sclera, PERRLA, EOMI Ears, Nose, Mouth, Throat: MMM, Normal external inspection ears/nares/mouth/lips/gums,  TM normal bilaterally. Pharynx no erythema, no exudate.  Neck: No masses, trachea midline. No thyroid enlargement/tenderness/mass appreciated. No lymphadenopathy Respiratory: Normal respiratory effort. no wheeze, no rhonchi, no rales Cardiovascular: S1/S2 normal, no murmur, no rub/gallop auscultated. RRR.  Musculoskeletal: Gait normal. No clubbing/cyanosis of digits.  Neurological: No cranial nerve deficit on limited exam. Motor and sensation intact and symmetric. Cerebellar reflexes intact. Full muscle strength, no tremor, no fasciculations Skin: warm, dry, intact. No rash/ulcer.  Psychiatric: Fair judgment/insight. Normal mood and affect. Oriented x3.    No results found for this or any previous visit (from the past 72 hour(s)).    ASSESSMENT/PLAN: At this point, I would suspect that the most likely cause of the patient's vague symptoms of muscle spasm/hyperkinesia would be side effect of lamictal (reviewed side effects possible, including hyperkinesia, confusion, sleep disturbance). Neuro exam normal and no real concern for CVA however pt given printed CVA precautions and RTC/ER precautions. Followup with PCP 1 week, sooner if needed. Advised contact psychiatry and see if she can get office visit sooner than her usual followup to get second opinion on possibility of side effects of medication and discuss alternatives if she  would like, also further address insomnia problems.   Muscle spasm - Plan: CBC with Differential/Platelet, TSH, Comprehensive Metabolic Panel (CMET), Magnesium, CANCELED: CBC with Differential/Platelet, CANCELED: COMPLETE METABOLIC PANEL WITH GFR, CANCELED: TSH, CANCELED: Magnesium     Return in about 1 week (around 01/05/2016), or sooner if needed, for recheck with PCP.  Total time spent 40 minutes, greater than 50% of the visit was counseling and coordinating care for diagnosis of muscle spasm/medication side effect.

## 2015-12-29 NOTE — Telephone Encounter (Signed)
Return telephone call to pt. Pt was informed she will need to f/u with Dr. De Nurse. Informed pt provider is out of the office this week. Pt will need to proceed to urgent care or her local emergency room. PT verbalizes understanding. Pt is schedule to f/u with Dr. De Nurse 01/06/16.

## 2015-12-29 NOTE — Telephone Encounter (Signed)
Notified patient that she can either take the tylenol PM or Benadryl, but not both.  50 mg is all she should be taking.

## 2015-12-29 NOTE — Telephone Encounter (Signed)
Pt is out of the appt with PCP. Wants to talk to nurse, pt feels like she is going manic.

## 2015-12-29 NOTE — Telephone Encounter (Signed)
Return telephone call to pt. Pt states she has appt with primary care provider. Pt will follow up with clinic after appt.

## 2015-12-30 LAB — CBC WITH DIFFERENTIAL/PLATELET
CHLORIDE: 104 mmol/L
CO2: 21
Calcium, Ser: 9
MAGNESIUM: 1.9
MCV: 99
Total Protein: 6.6 g/dL

## 2015-12-31 ENCOUNTER — Telehealth: Payer: Self-pay | Admitting: Osteopathic Medicine

## 2015-12-31 NOTE — Telephone Encounter (Signed)
Please call patient: Lab cores sent me her results. No concerns on labs. Kidney function is low but is stable from previous measurements so I wouldn't worry about it. Thyroid, blood counts, electrolytes, and liver are normal. Would still recommend follow-up with Dr. Madilyn Fireman as discussed at her visit.

## 2016-01-01 NOTE — Telephone Encounter (Signed)
Pt notified and voiced understanding 

## 2016-01-06 ENCOUNTER — Ambulatory Visit (INDEPENDENT_AMBULATORY_CARE_PROVIDER_SITE_OTHER): Payer: BLUE CROSS/BLUE SHIELD | Admitting: Family Medicine

## 2016-01-06 ENCOUNTER — Encounter: Payer: Self-pay | Admitting: Family Medicine

## 2016-01-06 ENCOUNTER — Ambulatory Visit (INDEPENDENT_AMBULATORY_CARE_PROVIDER_SITE_OTHER): Payer: BLUE CROSS/BLUE SHIELD | Admitting: Psychiatry

## 2016-01-06 ENCOUNTER — Encounter (HOSPITAL_COMMUNITY): Payer: Self-pay | Admitting: Psychiatry

## 2016-01-06 VITALS — BP 90/60 | HR 72 | Ht 67.0 in | Wt 97.6 lb

## 2016-01-06 VITALS — BP 87/52 | HR 74 | Ht 67.0 in | Wt 98.4 lb

## 2016-01-06 DIAGNOSIS — M6289 Other specified disorders of muscle: Secondary | ICD-10-CM

## 2016-01-06 DIAGNOSIS — R252 Cramp and spasm: Secondary | ICD-10-CM | POA: Diagnosis not present

## 2016-01-06 DIAGNOSIS — F411 Generalized anxiety disorder: Secondary | ICD-10-CM

## 2016-01-06 DIAGNOSIS — T887XXA Unspecified adverse effect of drug or medicament, initial encounter: Secondary | ICD-10-CM | POA: Diagnosis not present

## 2016-01-06 DIAGNOSIS — G4700 Insomnia, unspecified: Secondary | ICD-10-CM | POA: Diagnosis not present

## 2016-01-06 DIAGNOSIS — F319 Bipolar disorder, unspecified: Secondary | ICD-10-CM

## 2016-01-06 DIAGNOSIS — T50905A Adverse effect of unspecified drugs, medicaments and biological substances, initial encounter: Secondary | ICD-10-CM

## 2016-01-06 DIAGNOSIS — F4310 Post-traumatic stress disorder, unspecified: Secondary | ICD-10-CM | POA: Diagnosis not present

## 2016-01-06 DIAGNOSIS — G729 Myopathy, unspecified: Secondary | ICD-10-CM

## 2016-01-06 MED ORDER — OXCARBAZEPINE 150 MG PO TABS: 300.0000 mg | ORAL_TABLET | Freq: Every day | ORAL | Status: DC

## 2016-01-06 MED ORDER — DULOXETINE HCL 60 MG PO CPEP: 120.0000 mg | ORAL_CAPSULE | Freq: Every day | ORAL | Status: DC

## 2016-01-06 MED ORDER — TRAZODONE HCL 100 MG PO TABS: 100.0000 mg | ORAL_TABLET | Freq: Every day | ORAL | Status: DC
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
## 2016-01-06 NOTE — Progress Notes (Signed)
   Subjective:    Patient ID: Mandy Shaw, female    DOB: 1961-04-06, 55 y.o.   MRN: [id number]  HPI Here today to follow-up for recent symptoms. She's complained of feeling like her muscles are very tight. She also feels like her face is drawing downward. She's also noticed some numbness in her face as well as some numbness and tickling in her hands up to her mid forearms. No worsening or alleviating factors. She saw my partner last week and they did some blood work to rule out abnormal calcium, magnesium, potassium and electrolytes. Everything came back normal. She did recently start on Lamictal with her psychiatrist about 3 weeks ago and there was some concern about whether or not this could actually be causing some of her side effects.   Review of Systems     Objective:   Physical Exam  Constitutional: She is oriented to person, place, and time. She appears well-developed and well-nourished.  HENT:  Head: Normocephalic and atraumatic.  Neck: Neck supple. No thyromegaly present.  Cardiovascular: Normal rate, regular rhythm and normal heart sounds.   Pulmonary/Chest: Effort normal and breath sounds normal.  Lymphadenopathy:    She has no cervical adenopathy.  Neurological: She is alert and oriented to person, place, and time. She has normal reflexes. She displays normal reflexes. No cranial nerve deficit. She exhibits normal muscle tone. Coordination normal.  No hyperreflexia.  Skin: Skin is warm and dry.  Psychiatric: She has a normal mood and affect. Her behavior is normal.          Assessment & Plan:  Muscle tightness/drawing-unclear etiology. It does not seem to be triggered by an electrolyte disturbance. She is tapering off of her Topamax but has been doing this slowly over the last couple of weeks. She actually will complete the Topamax on Thursday. I did encourage her to discuss whether or not the Lamictal could be causing some side effects at her appointment this afternoon  with him, Dr. De Nurse. If he feels strongly that it's not the medication and we can do some additional blood work including CK and sedimentation rate and CRP.   Medication side effect - see above.

## 2016-01-16 ENCOUNTER — Encounter: Payer: Self-pay | Admitting: Family Medicine

## 2016-01-16 ENCOUNTER — Ambulatory Visit (HOSPITAL_COMMUNITY): Payer: Self-pay | Admitting: Psychiatry

## 2016-01-27 ENCOUNTER — Ambulatory Visit (INDEPENDENT_AMBULATORY_CARE_PROVIDER_SITE_OTHER): Payer: BLUE CROSS/BLUE SHIELD | Admitting: Psychiatry

## 2016-01-27 ENCOUNTER — Encounter (HOSPITAL_COMMUNITY): Payer: Self-pay | Admitting: Psychiatry

## 2016-01-27 VITALS — BP 88/62 | HR 77 | Ht 67.0 in | Wt 102.0 lb

## 2016-01-27 DIAGNOSIS — F411 Generalized anxiety disorder: Secondary | ICD-10-CM

## 2016-01-27 DIAGNOSIS — F319 Bipolar disorder, unspecified: Secondary | ICD-10-CM

## 2016-01-27 DIAGNOSIS — G4700 Insomnia, unspecified: Secondary | ICD-10-CM

## 2016-01-27 DIAGNOSIS — F4310 Post-traumatic stress disorder, unspecified: Secondary | ICD-10-CM | POA: Diagnosis not present

## 2016-01-27 MED ORDER — DULOXETINE HCL 60 MG PO CPEP: 120.0000 mg | ORAL_CAPSULE | Freq: Every day | ORAL | Status: DC

## 2016-01-27 MED ORDER — TRAZODONE HCL 100 MG PO TABS: 100.0000 mg | ORAL_TABLET | Freq: Every day | ORAL | Status: DC

## 2016-01-27 MED ORDER — LAMOTRIGINE 25 MG PO TABS: 25.0000 mg | ORAL_TABLET | Freq: Every day | ORAL | Status: DC

## 2016-01-27 MED ORDER — MIRTAZAPINE 7.5 MG PO TABS: 7.5000 mg | ORAL_TABLET | Freq: Every day | ORAL | Status: DC
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
## 2016-02-04 ENCOUNTER — Other Ambulatory Visit: Payer: Self-pay | Admitting: Family Medicine

## 2016-02-04 DIAGNOSIS — R636 Underweight: Secondary | ICD-10-CM | POA: Diagnosis not present

## 2016-02-06 ENCOUNTER — Encounter: Payer: Self-pay | Admitting: Family Medicine

## 2016-02-06 ENCOUNTER — Other Ambulatory Visit (HOSPITAL_COMMUNITY)
Admission: RE | Admit: 2016-02-06 | Discharge: 2016-02-06 | Disposition: A | Discharge status: Home / Self Care | Payer: BLUE CROSS/BLUE SHIELD | Source: Ambulatory Visit | Attending: Family Medicine | Admitting: Family Medicine

## 2016-02-06 ENCOUNTER — Ambulatory Visit (INDEPENDENT_AMBULATORY_CARE_PROVIDER_SITE_OTHER): Payer: BLUE CROSS/BLUE SHIELD | Admitting: Family Medicine

## 2016-02-06 VITALS — BP 98/55 | HR 60 | Wt 107.0 lb

## 2016-02-06 DIAGNOSIS — Z124 Encounter for screening for malignant neoplasm of cervix: Secondary | ICD-10-CM | POA: Diagnosis not present

## 2016-02-06 DIAGNOSIS — R252 Cramp and spasm: Secondary | ICD-10-CM

## 2016-02-06 DIAGNOSIS — E039 Hypothyroidism, unspecified: Secondary | ICD-10-CM | POA: Diagnosis not present

## 2016-02-06 DIAGNOSIS — K5901 Slow transit constipation: Secondary | ICD-10-CM

## 2016-02-06 DIAGNOSIS — R102 Pelvic and perineal pain: Secondary | ICD-10-CM

## 2016-02-06 DIAGNOSIS — R140 Abdominal distension (gaseous): Secondary | ICD-10-CM

## 2016-02-06 DIAGNOSIS — Z01419 Encounter for gynecological examination (general) (routine) without abnormal findings: Secondary | ICD-10-CM | POA: Diagnosis present

## 2016-02-06 DIAGNOSIS — Z1151 Encounter for screening for human papillomavirus (HPV): Secondary | ICD-10-CM | POA: Insufficient documentation

## 2016-02-06 DIAGNOSIS — R635 Abnormal weight gain: Secondary | ICD-10-CM

## 2016-02-06 DIAGNOSIS — N949 Unspecified condition associated with female genital organs and menstrual cycle: Secondary | ICD-10-CM

## 2016-02-06 NOTE — Progress Notes (Signed)
Subjective:    Patient ID: Mandy Shaw, female    DOB: 01-Nov-1960, 55 y.o.   MRN: [id number]  HPI Hypothyroid -  Since coming off of the trochanteric she has started to gain weight again but she feels like it's way too rapid. She's actually gained about 12 pounds in the last month. She says it's really made her uncomfortable and she can notice it when she walks.  Constipation-she's been very bloated over the last month. She says as soon as she eats breakfast she starts to bloat and then stays bloated all day. She says it can be quite uncomfortable at times. She had a plain film x-ray done with her GI office about 2 weeks ago and they said that she was full of stool. They started her on Linzess and then yesterday she also added what sounds like a stimulant medication to the Linzess to help move her bowels.  She's also here today for her Pap smear. She denies any pelvic pain or abnormal discharge. Her last Pap smear was in January 2013 so she is overdue.   Review of Systems BP 98/55 mmHg  Pulse 60  Wt 107 lb (48.535 kg)  SpO2 100%  LMP 06/20/2014    Allergies  Allergen Reactions  . Albuterol Anaphylaxis  . Other Anaphylaxis    MANGO, PARSLEY.  [name] Seed Anaphylaxis  . Macrodantin [Nitrofurantoin Macrocrystal] Itching  . Neurontin [Gabapentin]   . Penicillins     REACTION: Anaphalatic  . Sulfonamide Derivatives     REACTION: Anaphalatic    Past Medical History  Diagnosis Date  . COPD (chronic obstructive pulmonary disease) ([place])   . Thyroid disease   . Insomnia   . GERD (gastroesophageal reflux disease)   . Arthritis   . Bipolar 1 disorder ([place])   . Personality disorder   . 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 Cesarean section    . Cholecystectomy    . Thyroidectomy    . Leep    . Incontinence surgery    . Eye surgery    . Induced abortion    . Cardiac surgery      flap on heart  removed  . Laparotomy      exploratory for pain  . Condyloma excision/fulguration    . Right hip surgery  09/26/2013    Dr. Arnette Norris  . Left hip surgery  01/09/14    Dr. Arnette Norris  . Shoulder surgery Right 2015    Dr. Margretta Sidle    Social History   Social History  . Marital Status: Married    Spouse Name: N/A  . Number of Children: 1  . Years of Education: N/A   Occupational History  . disabled    Social History Main Topics  . Smoking status: Former Smoker -- 1.50 packs/day for 40 years    Types: Cigarettes    Quit date: 10/22/2015  . Smokeless tobacco: Never Used  . Alcohol Use: 0.0 oz/week    0 Standard drinks or equivalent per week     Comment: socially  . Drug Use: No  . Sexual Activity:    Partners: Male     Comment: married, 1 daughter, 1  1/2 yrs college, self-employed, no reg exercise, 3 caffeine drinks daily.   Other Topics Concern  . Not on file   Social History Narrative    Family History  Problem Relation Age  of Onset  . Alcohol abuse Father     Outpatient Encounter Prescriptions as of 02/06/2016  Medication Sig  . LINZESS 290 MCG CAPS capsule   . propranolol ER (INDERAL LA) 60 MG 24 hr capsule take 1 capsule by mouth every evening for 7 days then take 2 caps...  (REFER TO PRESCRIPTION NOTES).  . RA SENNA 8.6 MG tablet take 2 tablets by mouth every morning and take 2 tablets by mouth every evening  . SUMAtriptan (IMITREX) 100 MG tablet TAKE 1 TABLET BY MOUTH AT ONSET OF HEADACHE. MAY REPEAT 2 HOURS L...  (REFER TO PRESCRIPTION NOTES).  . topiramate (TOPAMAX) 50 MG tablet TAKE 1/2 TABLET BY MOUTH EVERY EVENING FOR 7 DAYS THEN 1 TABLET E...  (REFER TO PRESCRIPTION NOTES).  Marland Kitchen BIOTIN PO Take by mouth.  . Calcium Carb-Cholecalciferol (CALCIUM 600 + D PO) Take by mouth.  Sarajane Marek Sodium 30-100 MG CAPS Take by mouth at bedtime. Reported on 01/06/2016  . diphenhydramine-acetaminophen (TYLENOL PM) 25-500 MG TABS tablet Take 2 tablets by  mouth at bedtime.   . DULoxetine (CYMBALTA) 60 MG capsule Take 2 capsules (120 mg total) by mouth daily.  Marland Kitchen lamoTRIgine (LAMICTAL) 25 MG tablet Take 1 tablet (25 mg total) by mouth daily. Take one tablet daily for a week and then start taking 2 tablets.  Marland Kitchen levothyroxine (SYNTHROID, LEVOTHROID) 125 MCG tablet take 1 tablet by mouth daily  . mirtazapine (REMERON) 7.5 MG tablet Take 1 tablet (7.5 mg total) by mouth at bedtime.  Marland Kitchen omega-3 acid ethyl esters (LOVAZA) 1 g capsule take 4 capsules by mouth once daily  . omeprazole (PRILOSEC) 40 MG capsule Take 1 capsule (40 mg total) by mouth 2 (two) times daily.  . primidone (MYSOLINE) 50 MG tablet Take 50 mg by mouth 2 (two) times daily.  . simvastatin (ZOCOR) 20 MG tablet take 1 tablet by mouth daily  . traZODone (DESYREL) 100 MG tablet Take 1 tablet (100 mg total) by mouth at bedtime.  . Vitamins/Minerals TABS Take 1 tablet by mouth.  . [DISCONTINUED] famotidine (PEPCID) 20 MG tablet   . [DISCONTINUED] Topiramate ER 25 MG CP24 2 tabs po QD x 5 days, then one a day x 5 days.   No facility-administered encounter medications on file as of 02/06/2016.          Objective:   Physical Exam  Constitutional: She is oriented to person, place, and time. She appears well-developed and well-nourished.  HENT:  Head: Normocephalic and atraumatic.  Eyes: Conjunctivae and EOM are normal.  Cardiovascular: Normal rate.   Pulmonary/Chest: Effort normal.  Abdominal: Hernia confirmed negative in the right inguinal area and confirmed negative in the left inguinal area.  Genitourinary: Vagina normal. Uterus is tender. Cervix exhibits no motion tenderness, no discharge and no friability. Right adnexum displays tenderness and fullness. Right adnexum displays no mass. Left adnexum displays tenderness and fullness. Left adnexum displays no mass.  Lymphadenopathy:       Right: No inguinal adenopathy present.       Left: No inguinal adenopathy present.  Neurological:  She is alert and oriented to person, place, and time.  Skin: Skin is dry. No pallor.  Psychiatric: She has a normal mood and affect. Her behavior is normal.  Vitals reviewed.         Assessment & Plan:  Hypothyroidism-with rapid weight gain consider that it could be that her thyroid medication needs to be adjusted. We'll recheck her level today. Next  Abnormal weight  gain-I think part of it may be that the true kidney was just really suppressing her appetite as it is known to cause weight loss. Next  Constipation-continue with current regimen of Linzess and stimulant as prescribed by her GI doctor. Next  Bloating-because she was tender on her pelvic exam today I would like to order a pelvic ultrasound for further evaluation.  Pelvic tenderness on exam today-recommend ultrasound just for further evaluation especially with her recent weight gain and abnormal bloating.

## 2016-02-07 LAB — BASIC METABOLIC PANEL
BUN: 24 mg/dL — AB (ref 4–21)
CREATININE: 1.5 mg/dL — AB (ref 0.5–1.1)
Glucose: 78 mg/dL
Potassium: 5.2 mmol/L (ref 3.4–5.3)
SODIUM: 137 mmol/L (ref 137–147)

## 2016-02-07 LAB — TSH: TSH: 0.51 u[IU]/mL (ref 0.41–5.90)

## 2016-02-07 LAB — HEPATIC FUNCTION PANEL
ALK PHOS: 80 U/L (ref 25–125)
ALT: 45 U/L — AB (ref 7–35)
AST: 32 U/L (ref 13–35)

## 2016-02-07 LAB — HEMOGLOBIN A1C: Hemoglobin A1C: 5

## 2016-02-10 LAB — CYTOLOGY - PAP

## 2016-02-11 NOTE — Progress Notes (Signed)
Quick Note:  Call patient: Your Pap smear is normal. Repeat in 5 years. ______ 

## 2016-02-12 ENCOUNTER — Telehealth: Payer: Self-pay | Admitting: Family Medicine

## 2016-02-12 NOTE — Telephone Encounter (Signed)
Call pt: lab are all normal. Nothing to explain her cramping.

## 2016-02-13 NOTE — Telephone Encounter (Signed)
Pt informed.Mandy Shaw  

## 2016-02-17 ENCOUNTER — Other Ambulatory Visit: Payer: Self-pay | Admitting: Family Medicine

## 2016-02-17 DIAGNOSIS — R102 Pelvic and perineal pain: Secondary | ICD-10-CM

## 2016-02-17 DIAGNOSIS — IMO0001 Reserved for inherently not codable concepts without codable children: Secondary | ICD-10-CM

## 2016-02-17 NOTE — Progress Notes (Signed)
Order required per imaging.

## 2016-02-18 ENCOUNTER — Ambulatory Visit (INDEPENDENT_AMBULATORY_CARE_PROVIDER_SITE_OTHER): Payer: BLUE CROSS/BLUE SHIELD | Admitting: Psychiatry

## 2016-02-18 ENCOUNTER — Encounter (HOSPITAL_COMMUNITY): Payer: Self-pay | Admitting: Psychiatry

## 2016-02-18 VITALS — BP 94/60 | HR 59 | Ht 67.0 in | Wt 106.0 lb

## 2016-02-18 DIAGNOSIS — F411 Generalized anxiety disorder: Secondary | ICD-10-CM

## 2016-02-18 DIAGNOSIS — G4700 Insomnia, unspecified: Secondary | ICD-10-CM | POA: Diagnosis not present

## 2016-02-18 DIAGNOSIS — F4310 Post-traumatic stress disorder, unspecified: Secondary | ICD-10-CM | POA: Diagnosis not present

## 2016-02-18 DIAGNOSIS — F319 Bipolar disorder, unspecified: Secondary | ICD-10-CM | POA: Diagnosis not present

## 2016-02-18 MED ORDER — LAMOTRIGINE 100 MG PO TABS: 100.0000 mg | ORAL_TABLET | Freq: Every day | ORAL | Status: DC

## 2016-02-18 MED ORDER — DULOXETINE HCL 60 MG PO CPEP: 120.0000 mg | ORAL_CAPSULE | Freq: Every day | ORAL | Status: DC

## 2016-02-18 MED ORDER — MIRTAZAPINE 7.5 MG PO TABS: 7.5000 mg | ORAL_TABLET | Freq: Every day | ORAL | Status: DC
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
## 2016-02-20 ENCOUNTER — Ambulatory Visit (INDEPENDENT_AMBULATORY_CARE_PROVIDER_SITE_OTHER): Payer: BLUE CROSS/BLUE SHIELD

## 2016-02-20 ENCOUNTER — Encounter: Payer: Self-pay | Admitting: Family Medicine

## 2016-02-20 DIAGNOSIS — IMO0001 Reserved for inherently not codable concepts without codable children: Secondary | ICD-10-CM

## 2016-02-20 DIAGNOSIS — R102 Pelvic and perineal pain: Secondary | ICD-10-CM

## 2016-02-20 DIAGNOSIS — R140 Abdominal distension (gaseous): Secondary | ICD-10-CM

## 2016-02-22 ENCOUNTER — Other Ambulatory Visit: Payer: Self-pay | Admitting: Family Medicine

## 2016-02-24 ENCOUNTER — Telehealth: Payer: Self-pay

## 2016-02-25 NOTE — Telephone Encounter (Signed)
Patient has been on omeprazole. When she picked up her medications from the pharmacy there was Pepcid instead of omeprazole. She was wanting to switch to a low tier and the Pepcid is cheaper. Did you want her to switch?

## 2016-02-25 NOTE — Telephone Encounter (Signed)
Not intentionally. We got a request for the pepcid from her pharmacy so I assumed that is what she wanted so we approved it.

## 2016-02-26 DIAGNOSIS — R339 Retention of urine, unspecified: Secondary | ICD-10-CM | POA: Diagnosis not present

## 2016-02-26 DIAGNOSIS — N281 Cyst of kidney, acquired: Secondary | ICD-10-CM | POA: Diagnosis not present

## 2016-02-26 DIAGNOSIS — R801 Persistent proteinuria, unspecified: Secondary | ICD-10-CM | POA: Diagnosis not present

## 2016-02-26 DIAGNOSIS — N183 Chronic kidney disease, stage 3 (moderate): Secondary | ICD-10-CM | POA: Diagnosis not present

## 2016-03-01 ENCOUNTER — Telehealth: Payer: Self-pay

## 2016-03-01 ENCOUNTER — Other Ambulatory Visit: Payer: Self-pay | Admitting: Family Medicine

## 2016-03-01 DIAGNOSIS — E785 Hyperlipidemia, unspecified: Secondary | ICD-10-CM

## 2016-03-01 MED ORDER — SIMVASTATIN 20 MG PO TABS: 20.0000 mg | ORAL_TABLET | Freq: Every day | ORAL | Status: DC

## 2016-03-01 NOTE — Telephone Encounter (Signed)
Okay to refill her Zocor but she does need to come in for lipid panel. No she does not need to make an appointment with me for that. As far as the Prilosec versus the pelvis Prevacid please see the prior phone note.

## 2016-03-01 NOTE — Telephone Encounter (Signed)
Notified patient of recommendation.  I will order lipid panel from [facility] as [facility] does not file it with her insurance.

## 2016-03-02 DIAGNOSIS — N179 Acute kidney failure, unspecified: Secondary | ICD-10-CM | POA: Diagnosis not present

## 2016-03-02 LAB — LIPID PANEL
CHOLESTEROL: 189 mg/dL (ref 0–200)
HDL: 90 mg/dL — AB (ref 35–70)
LDL Cholesterol: 89 mg/dL
TRIGLYCERIDES: 52 mg/dL (ref 40–160)

## 2016-03-10 ENCOUNTER — Ambulatory Visit (HOSPITAL_COMMUNITY): Payer: Self-pay | Admitting: Psychiatry

## 2016-03-10 ENCOUNTER — Encounter: Payer: Self-pay | Admitting: Family Medicine

## 2016-03-11 ENCOUNTER — Telehealth (HOSPITAL_COMMUNITY): Payer: Self-pay | Admitting: Psychiatry

## 2016-03-11 ENCOUNTER — Ambulatory Visit (INDEPENDENT_AMBULATORY_CARE_PROVIDER_SITE_OTHER): Payer: BLUE CROSS/BLUE SHIELD | Admitting: Psychiatry

## 2016-03-11 ENCOUNTER — Encounter (HOSPITAL_COMMUNITY): Payer: Self-pay | Admitting: Psychiatry

## 2016-03-11 VITALS — BP 98/64 | HR 53 | Ht 67.0 in | Wt 105.0 lb

## 2016-03-11 DIAGNOSIS — F4310 Post-traumatic stress disorder, unspecified: Secondary | ICD-10-CM

## 2016-03-11 DIAGNOSIS — F319 Bipolar disorder, unspecified: Secondary | ICD-10-CM | POA: Diagnosis not present

## 2016-03-11 DIAGNOSIS — G4700 Insomnia, unspecified: Secondary | ICD-10-CM | POA: Diagnosis not present

## 2016-03-11 DIAGNOSIS — F411 Generalized anxiety disorder: Secondary | ICD-10-CM

## 2016-03-11 MED ORDER — MIRTAZAPINE 7.5 MG PO TABS: 7.5000 mg | ORAL_TABLET | Freq: Every day | ORAL | Status: DC

## 2016-03-11 MED ORDER — LAMOTRIGINE 150 MG PO TABS: 150.0000 mg | ORAL_TABLET | Freq: Every day | ORAL | Status: DC

## 2016-03-11 MED ORDER — MIRTAZAPINE 15 MG PO TABS: 15.0000 mg | ORAL_TABLET | Freq: Every day | ORAL | Status: DC

## 2016-03-11 MED ORDER — DULOXETINE HCL 60 MG PO CPEP: 120.0000 mg | ORAL_CAPSULE | Freq: Every day | ORAL | Status: DC
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
## 2016-03-11 NOTE — Telephone Encounter (Signed)
Return telephone call to patient. Informed pt rx for Remeron 15mg , #30 was faxed to [facility] at [phone]. Pt is schedule for a f/u appt on 04/01/16.

## 2016-03-17 ENCOUNTER — Ambulatory Visit (INDEPENDENT_AMBULATORY_CARE_PROVIDER_SITE_OTHER): Payer: BLUE CROSS/BLUE SHIELD | Admitting: Podiatry

## 2016-03-17 ENCOUNTER — Encounter: Payer: Self-pay | Admitting: Podiatry

## 2016-03-17 VITALS — BP 125/72 | HR 56

## 2016-03-17 DIAGNOSIS — L600 Ingrowing nail: Secondary | ICD-10-CM

## 2016-03-17 DIAGNOSIS — M79673 Pain in unspecified foot: Secondary | ICD-10-CM | POA: Diagnosis not present

## 2016-03-17 DIAGNOSIS — M79606 Pain in leg, unspecified: Secondary | ICD-10-CM

## 2016-03-17 NOTE — Progress Notes (Signed)
SUBJECTIVE: 55 y.o. year old female presents requesting toe nails trimmed and check on ingrown nails.   OBJECTIVE: DERMATOLOGIC EXAMINATION: Nails: Both great toe nails been done for ingrown toe nails on both borders in past.  Dystrophic nail at distal 1/2 with ingrown nail on medial and lateral borders of both great toes, symptomatic.  No open skin or drainage noted.  VASCULAR EXAMINATION OF LOWER LIMBS: Pedal pulses: All pedal pulses are faintly palpable. Capillary Filling times within 3 seconds in all digits.  Temperature gradient from tibial crest to dorsum of foot is within normal bilateral. NEUROLOGIC EXAMINATION OF THE LOWER LIMBS: Failed to respond to Monofilament (Semmes-Weinstein 10-gm) sensory testing 6 out of 6, bilateral. Vibratory sensations(128Hz  turning fork) intact at medial and lateral forefoot bilateral.  Sharp and Dull discriminatory sensations at the plantar ball of hallux is intact bilateral.  MUSCULOSKELETAL EXAMINATION: No gross deformities noted.  ASSESSMENT: 1. Peripheral neuropathy. 2. Symptomatic ingrown nails both great toes.  PLAN: Reviewed clinical findings and available treatment options. Both great toe nails debrided and grinded.

## 2016-03-17 NOTE — Patient Instructions (Signed)
Seen for painful big toe nails. Both big toe nails debrided and grinded. Return in 3 months or as needed.

## 2016-03-19 ENCOUNTER — Telehealth (HOSPITAL_COMMUNITY): Payer: Self-pay | Admitting: *Deleted

## 2016-03-19 MED ORDER — TRAZODONE HCL 50 MG PO TABS: 50.0000 mg | ORAL_TABLET | Freq: Every day | ORAL | Status: DC

## 2016-03-19 NOTE — Telephone Encounter (Signed)
Pt would like to restart Trazodone. Pt states she is not sleeping since the increasing Remeron 7.5mg  to 15mg . Informed pt will speak with Dr. De Nurse and return call. Per Dr. Evalina Field verbal order, pt may restart Trazodone 50mg  at bedtime. Rx for Trazodone 50mg , #30 was sent to [facility]. Return telephone call to pt, informed pt she may take Trazodone 50mg  at bedtime. Informed pt if symptoms worsen, pt may contact the office for an earlier appt or proceed to local emergency room. Pt is schedule for a f/u visit on 04/01/16. Pt verbalizes understanding.

## 2016-03-24 ENCOUNTER — Other Ambulatory Visit: Payer: Self-pay | Admitting: Family Medicine

## 2016-03-24 ENCOUNTER — Encounter (HOSPITAL_COMMUNITY): Payer: Self-pay | Admitting: Psychiatry

## 2016-03-24 ENCOUNTER — Ambulatory Visit (INDEPENDENT_AMBULATORY_CARE_PROVIDER_SITE_OTHER): Payer: BLUE CROSS/BLUE SHIELD | Admitting: Psychiatry

## 2016-03-24 VITALS — BP 94/60 | HR 58 | Ht 67.0 in | Wt 113.0 lb

## 2016-03-24 DIAGNOSIS — F319 Bipolar disorder, unspecified: Secondary | ICD-10-CM | POA: Diagnosis not present

## 2016-03-24 DIAGNOSIS — G4700 Insomnia, unspecified: Secondary | ICD-10-CM | POA: Diagnosis not present

## 2016-03-24 DIAGNOSIS — F4310 Post-traumatic stress disorder, unspecified: Secondary | ICD-10-CM

## 2016-03-24 DIAGNOSIS — F411 Generalized anxiety disorder: Secondary | ICD-10-CM | POA: Diagnosis not present

## 2016-03-24 MED ORDER — LAMOTRIGINE 200 MG PO TABS: 200.0000 mg | ORAL_TABLET | Freq: Every day | ORAL | Status: DC

## 2016-03-24 MED ORDER — MIRTAZAPINE 7.5 MG PO TABS: 7.5000 mg | ORAL_TABLET | Freq: Every day | ORAL | Status: DC

## 2016-03-24 MED ORDER — TRAZODONE HCL 100 MG PO TABS: 100.0000 mg | ORAL_TABLET | Freq: Every day | ORAL | Status: DC
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
## 2016-03-26 DIAGNOSIS — K5900 Constipation, unspecified: Secondary | ICD-10-CM | POA: Diagnosis not present

## 2016-03-27 ENCOUNTER — Other Ambulatory Visit (HOSPITAL_COMMUNITY): Payer: Self-pay | Admitting: Psychiatry

## 2016-04-01 ENCOUNTER — Ambulatory Visit (HOSPITAL_COMMUNITY): Payer: Self-pay | Admitting: Psychiatry

## 2016-04-05 NOTE — Telephone Encounter (Signed)
Received fax from [facility] requesting a refill for Lamictal 150mg . Per Dr. De Nurse, refill request is denied. Per provider note on 03/24/16, dosage was increase to Lamictal 200mg , #30. Rx was printed on 03/24/16. Pt is schedule for a f/u appt 04/20/16.

## 2016-04-08 DIAGNOSIS — G43109 Migraine with aura, not intractable, without status migrainosus: Secondary | ICD-10-CM | POA: Diagnosis not present

## 2016-04-08 DIAGNOSIS — G5601 Carpal tunnel syndrome, right upper limb: Secondary | ICD-10-CM | POA: Diagnosis not present

## 2016-04-08 DIAGNOSIS — G603 Idiopathic progressive neuropathy: Secondary | ICD-10-CM | POA: Diagnosis not present

## 2016-04-08 DIAGNOSIS — G5602 Carpal tunnel syndrome, left upper limb: Secondary | ICD-10-CM | POA: Diagnosis not present

## 2016-04-09 ENCOUNTER — Encounter: Payer: Self-pay | Admitting: Family Medicine

## 2016-04-09 ENCOUNTER — Ambulatory Visit (INDEPENDENT_AMBULATORY_CARE_PROVIDER_SITE_OTHER): Payer: BLUE CROSS/BLUE SHIELD | Admitting: Family Medicine

## 2016-04-09 VITALS — BP 100/52 | HR 63 | Wt 111.0 lb

## 2016-04-09 DIAGNOSIS — G4700 Insomnia, unspecified: Secondary | ICD-10-CM | POA: Diagnosis not present

## 2016-04-09 DIAGNOSIS — E039 Hypothyroidism, unspecified: Secondary | ICD-10-CM | POA: Diagnosis not present

## 2016-04-09 DIAGNOSIS — R636 Underweight: Secondary | ICD-10-CM

## 2016-04-09 DIAGNOSIS — M6530 Trigger finger, unspecified finger: Secondary | ICD-10-CM | POA: Diagnosis not present

## 2016-04-09 MED ORDER — RAMELTEON 8 MG PO TABS: 8.0000 mg | ORAL_TABLET | Freq: Every day | ORAL | Status: DC

## 2016-04-09 NOTE — Progress Notes (Signed)
Subjective:    CC:   HPI: Two-month follow-up for hypothyroidism- she was losing excessive amounts of weight but we think it may have actually been from the toe. May. She is now off that medication in my last saw her in May she actually gained about 12 pounds. She said she felt very uncomfortable with the weight gain that she had experienced but overall she actually looks much healthier and her BMI was then normal at 16. Last TSH in May was 0.51.    Insomnia-she did try some of the over-the-counter supplements including the Valerian root and found it really not helpful for sleep. She is Artie taking 50 mg of Benadryl and says that only helps a little bit.  She is also getting some triggering of her middle finger on her left hand. And she feels like her ring finger starting to do the same thing.   Past medical history, Surgical history, Family history not pertinant except as noted below, Social history, Allergies, and medications have been entered into the medical record, reviewed, and corrections made.   Review of Systems: No fevers, chills, night sweats, weight loss, chest pain, or shortness of breath.   Objective:    General: Well Developed, well nourished, and in no acute distress.  Neuro: Alert and oriented x3, extra-ocular muscles intact, sensation grossly intact.  HEENT: Normocephalic, atraumatic  Skin: Warm and dry, no rashes. Cardiac: Regular rate and rhythm, no murmurs rubs or gallops, no lower extremity edema.  Respiratory: Clear to auscultation bilaterally. Not using accessory muscles, speaking in full sentences. Hand, left : She has visible triggering of the middle finger on the left hand. She is able to move the left but it does seem a little stiff. I was able to palpate a small nodule on the tendon on the middle finger.   Impression and Recommendations:   Hypothyroidism-TSH in May was a little too suppressed. . I think her weight has normalized little bit so now might be a  good time to recheck her thyroid before we adjust her dose.  Underweight - she has actually lost 2 lbs.  She had rapidly gained 12 pounds and was extremely concerned about it when I saw her previously. I do think her weight has leveled off.  Insomnia -  discussed options. Initially also start her on doxepin but this can evidently cause QT prolongation in combination with her medications. Will try Rozerem. New prescription sent to the pharmacy. Discontinue Benadryl.  Trigger finger-encourage her to schedule with one of our sports medicine doctors for an injection for treatment.

## 2016-04-09 NOTE — Patient Instructions (Signed)
Will try rozerem for sleep. Stop you benadryl for a couple of nights before starting the Rozerem.

## 2016-04-12 ENCOUNTER — Telehealth: Payer: Self-pay | Admitting: Family Medicine

## 2016-04-12 ENCOUNTER — Other Ambulatory Visit (HOSPITAL_COMMUNITY): Payer: Self-pay | Admitting: Psychiatry

## 2016-04-12 DIAGNOSIS — E039 Hypothyroidism, unspecified: Secondary | ICD-10-CM

## 2016-04-12 NOTE — Telephone Encounter (Signed)
Fax received from [facility] requesting refills for Remeron and Lamictal. Per Dr. De Nurse, refill requests are denied. Pt request refills too early. Pt received a printed rx for Remeron 7.5mg ,#30 and Lamictal 200mg , #30 on 03/24/16. Spoke w/ Melissa at [facility], pt pick up rx on 7/5. Pt is schedule for a f/u appt on 7/27. Lvm for pt to contact office.

## 2016-04-12 NOTE — Telephone Encounter (Signed)
Pt called to see what labs she was supposed to get completed. Pt wants to pick up lab slip and take to [facility] for completion. The only lab she knows of is to recheck her TSH. Will route.

## 2016-04-13 NOTE — Telephone Encounter (Signed)
I think just TSH

## 2016-04-13 NOTE — Telephone Encounter (Signed)
Order placed, Pt notified. Will pick up lab slip this week.

## 2016-04-15 ENCOUNTER — Encounter: Payer: Self-pay | Admitting: Osteopathic Medicine

## 2016-04-15 ENCOUNTER — Ambulatory Visit (INDEPENDENT_AMBULATORY_CARE_PROVIDER_SITE_OTHER): Payer: BLUE CROSS/BLUE SHIELD | Admitting: Family Medicine

## 2016-04-15 ENCOUNTER — Institutional Professional Consult (permissible substitution): Payer: Self-pay | Admitting: Family Medicine

## 2016-04-15 ENCOUNTER — Ambulatory Visit (INDEPENDENT_AMBULATORY_CARE_PROVIDER_SITE_OTHER): Payer: BLUE CROSS/BLUE SHIELD | Admitting: Osteopathic Medicine

## 2016-04-15 VITALS — BP 116/69 | HR 73 | Temp 97.9°F | Wt 112.0 lb

## 2016-04-15 DIAGNOSIS — M65341 Trigger finger, right ring finger: Secondary | ICD-10-CM

## 2016-04-15 DIAGNOSIS — M65322 Trigger finger, left index finger: Secondary | ICD-10-CM

## 2016-04-15 DIAGNOSIS — M6530 Trigger finger, unspecified finger: Secondary | ICD-10-CM

## 2016-04-15 DIAGNOSIS — M7989 Other specified soft tissue disorders: Secondary | ICD-10-CM

## 2016-04-15 DIAGNOSIS — N3090 Cystitis, unspecified without hematuria: Secondary | ICD-10-CM

## 2016-04-15 DIAGNOSIS — M65352 Trigger finger, left little finger: Secondary | ICD-10-CM

## 2016-04-15 DIAGNOSIS — M65312 Trigger thumb, left thumb: Secondary | ICD-10-CM

## 2016-04-15 DIAGNOSIS — M65351 Trigger finger, right little finger: Secondary | ICD-10-CM

## 2016-04-15 DIAGNOSIS — R350 Frequency of micturition: Secondary | ICD-10-CM | POA: Diagnosis not present

## 2016-04-15 DIAGNOSIS — M65321 Trigger finger, right index finger: Secondary | ICD-10-CM

## 2016-04-15 DIAGNOSIS — M65342 Trigger finger, left ring finger: Secondary | ICD-10-CM

## 2016-04-15 DIAGNOSIS — M65331 Trigger finger, right middle finger: Secondary | ICD-10-CM

## 2016-04-15 DIAGNOSIS — M65311 Trigger thumb, right thumb: Secondary | ICD-10-CM | POA: Insufficient documentation

## 2016-04-15 DIAGNOSIS — M79606 Pain in leg, unspecified: Secondary | ICD-10-CM

## 2016-04-15 DIAGNOSIS — M65332 Trigger finger, left middle finger: Secondary | ICD-10-CM

## 2016-04-15 LAB — BASIC METABOLIC PANEL
BUN: 26 mg/dL — AB (ref 4–21)
CREATININE: 1.3 mg/dL — AB (ref 0.5–1.1)
GLUCOSE: 88 mg/dL
Potassium: 3.7 mmol/L (ref 3.4–5.3)
SODIUM: 140 mmol/L (ref 137–147)

## 2016-04-15 LAB — POCT URINALYSIS DIPSTICK
BILIRUBIN UA: NEGATIVE
Blood, UA: NEGATIVE
GLUCOSE UA: NEGATIVE
Ketones, UA: NEGATIVE
Leukocytes, UA: NEGATIVE
NITRITE UA: NEGATIVE
PROTEIN UA: NEGATIVE
Spec Grav, UA: 1.01
UROBILINOGEN UA: 0.2
pH, UA: 5.5

## 2016-04-15 LAB — HEPATIC FUNCTION PANEL
ALT: 36 U/L — AB (ref 7–35)
AST: 53 U/L — AB (ref 13–35)
Alkaline Phosphatase: 97 U/L (ref 25–125)
BILIRUBIN, TOTAL: 0.2 mg/dL

## 2016-04-15 LAB — TSH
TSH: 1.31 u[IU]/mL (ref 0.41–5.90)
TSH: 1.31 u[IU]/mL (ref 0.41–5.90)

## 2016-04-15 MED ORDER — CIPROFLOXACIN HCL 500 MG PO TABS: 500.0000 mg | ORAL_TABLET | Freq: Two times a day (BID) | ORAL | 0 refills | Status: DC

## 2016-04-15 NOTE — Addendum Note (Signed)
Addended by: Doree Albee on: 04/15/2016 02:22 PM   Modules accepted: Orders

## 2016-04-15 NOTE — Patient Instructions (Signed)
Thank you for coming in today. Call or go to the ER if you develop a large red swollen joint with extreme pain or oozing puss.  Continue the bandaid splints.  Return in 1 month or so especially if not better.   Take it easy for 1 a few weeks.   Trigger Finger Trigger finger (digital tendinitis and stenosing tenosynovitis) is a common disorder that causes an often painful catching of the fingers or thumb. It occurs as a clicking, snapping, or locking of a finger in the palm of the hand. This is caused by a problem with the tendons that flex or bend the fingers sliding smoothly through their sheaths. The condition may occur in any finger or a couple fingers at the same time.  The finger may lock with the finger curled or suddenly straighten out with a snap. This is more common in patients with rheumatoid arthritis and diabetes. Left untreated, the condition may get worse to the point where the finger becomes locked in flexion, like making a fist, or less commonly locked with the finger straightened out. CAUSES   Inflammation and scarring that lead to swelling around the tendon sheath.  Repeated or forceful movements.  Rheumatoid arthritis, an autoimmune disease that affects joints.  Gout.  Diabetes mellitus. SIGNS AND SYMPTOMS  Soreness and swelling of your finger.  A painful clicking or snapping as you bend and straighten your finger. DIAGNOSIS  Your health care provider will do a physical exam of your finger to diagnose trigger finger. TREATMENT   Splinting for 6-8 weeks may be helpful.  Nonsteroidal anti-inflammatory medicines (NSAIDs) can help to relieve the pain and inflammation.  Cortisone injections, along with splinting, may speed up recovery. Several injections may be required. Cortisone may give relief after one injection.  Surgery is another treatment that may be used if conservative treatments do not work. Surgery can be minor, without incisions (a cut does not have to  be made), and can be done with a needle through the skin.  Other surgical choices involve an open procedure in which the surgeon opens the hand through a small incision and cuts the pulley so the tendon can again slide smoothly. Your hand will still work fine. HOME CARE INSTRUCTIONS  Apply ice to the injured area, twice per day:  Put ice in a plastic bag.  Place a towel between your skin and the bag.  Leave the ice on for 20 minutes, 3-4 times a day.  Rest your hand often. MAKE SURE YOU:   Understand these instructions.  Will watch your condition.  Will get help right away if you are not doing well or get worse.   This information is not intended to replace advice given to you by your health care provider. Make sure you discuss any questions you have with your health care provider.   Document Released: 06/26/2004 Document Revised: 05/09/2013 Document Reviewed: 02/06/2013 Elsevier Interactive Patient Education [employer].

## 2016-04-15 NOTE — Progress Notes (Signed)
Chief Complaint: Possible UTI  History of Present Illness: Mandy Shaw is a 55 y.o. female who presents to [facility]  today with concerns for Chief Complaint  Patient presents with  . Urinary Frequency   UTI? Onset: last night   Quality: Burning/Urgency - increased frequency and nocturia - feels exactly like previous UTI  Associated Symptoms: see ROS below Context: Previous UTI: 123XX123 Complicated (any of the following): no ?Diabetes ?Pregnancy ?Symptoms for seven or more days before seeking care ?Hospital acquired infection ?Renal failure ?Urinary tract obstruction ?Indwelling urethral catheter, stent, nephrostomy tube or urinary diversion ?Functional/anatomic abnormality of the urinary tract ?Renal transplantation ?Immunosuppression   SWELLING  Lower extremities, bilateral, worse yesterday, still ankle pain at this time, no pitting she can recall. Wellhydrated, has been outside walking frequently, no claudication    Past medical, social and family history reviewed: Past Medical History:  Diagnosis Date  . Arthritis   . Bipolar 1 disorder ([place])   . COPD (chronic obstructive pulmonary disease) ([place])   . GERD (gastroesophageal reflux disease)   . Insomnia   . 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 Personality disorder   . Thyroid disease    Past Surgical History:  Procedure Laterality Date  . CARDIAC SURGERY     flap on heart removed  . CESAREAN SECTION    . CHOLECYSTECTOMY    . CONDYLOMA EXCISION/FULGURATION    . EYE SURGERY    . INCONTINENCE SURGERY    . INDUCED ABORTION    . LAPAROTOMY     exploratory for pain  . LEEP    . Left hip surgery  01/09/14   Dr. Arnette Norris  . right hip surgery  09/26/2013   Dr. Arnette Norris  . SHOULDER SURGERY Right 2015   Dr. Margretta Sidle  . THYROIDECTOMY     Social History  Substance Use Topics  . Smoking status: Former Smoker    Packs/day: 1.50    Years:  40.00    Types: Cigarettes    Quit date: 10/22/2015  . Smokeless tobacco: Never Used  . Alcohol use 0.0 oz/week     Comment: socially   The patient has a family history of  Current Outpatient Prescriptions  Medication Sig Dispense Refill  . BIOTIN PO Take by mouth.    . Calcium Carb-Cholecalciferol (CALCIUM 600 + D PO) Take by mouth.    . DULoxetine (CYMBALTA) 60 MG capsule Take 2 capsules (120 mg total) by mouth daily. 60 capsule 0  . famotidine (PEPCID) 20 MG tablet take 1 tablet by mouth once daily 30 tablet 1  . lactulose (CHRONULAC) 10 GM/15ML solution Take 30 mLs by mouth 3 (three) times daily.  0  . lamoTRIgine (LAMICTAL) 200 MG tablet Take 1 tablet (200 mg total) by mouth daily. One per day. 30 tablet 0  . levothyroxine (SYNTHROID, LEVOTHROID) 125 MCG tablet take 1 tablet by mouth daily 30 tablet 1  . mirtazapine (REMERON) 7.5 MG tablet Take 1 tablet (7.5 mg total) by mouth at bedtime. Lower dose back to 7.5mg  qhs 30 tablet 0  . omega-3 acid ethyl esters (LOVAZA) 1 g capsule take 4 capsules by mouth once daily 120 capsule 11  . primidone (MYSOLINE) 50 MG tablet Take 50 mg by mouth 2 (two) times daily.    . propranolol ER (INDERAL LA) 60 MG 24 hr capsule Take 1 capsule by mouth at bedtime.    Marland Kitchen RA SENNA 8.6 MG  tablet Take 2 tablets by mouth 2 (two) times daily.  0  . ramelteon (ROZEREM) 8 MG tablet Take 1 tablet (8 mg total) by mouth at bedtime. 30 tablet 1  . SUMAtriptan (IMITREX) 100 MG tablet TAKE 1 TABLET BY MOUTH AT ONSET OF HEADACHE. MAY REPEAT 2 HOURS L...  (REFER TO PRESCRIPTION NOTES).  0  . traZODone (DESYREL) 100 MG tablet Take 1 tablet (100 mg total) by mouth at bedtime. 30 tablet 0  . Vitamins/Minerals TABS Take 1 tablet by mouth.     No current facility-administered medications for this visit.    Allergies  Allergen Reactions  . Albuterol Anaphylaxis  . Other Anaphylaxis    MANGO, PARSLEY.  [name] Seed Anaphylaxis  . Silica [Nutritional Supplements] Itching   . Macrodantin [Nitrofurantoin Macrocrystal] Itching  . Neurontin [Gabapentin]   . Penicillins     REACTION: Anaphalatic  . Sulfonamide Derivatives     REACTION: Anaphalatic     Review of Systems: CONSTITUTIONAL: Negative fever/chills CARDIAC: No chest pain/pressure/palpitations, no orthopnea RESPIRATORY: No cough/shortness of breath/wheeze GASTROINTESTINAL: No nausea/vomiting/abdominal pain/blood in stool/diarrhea/constipation MUSCULOSKELETAL: see below re: flank pain GENITOURINARY:    Frequency: yes  Hematuria: no  Odor: no  Incontinence: no  Flank Pain: yes  Vaginal bleeding/discharge: no   Exam:  BP 116/69   Pulse 73   Temp 97.9 F (36.6 C) (Oral)   Wt 112 lb (50.8 kg)   LMP 06/20/2014   BMI 17.54 kg/m  Constitutional: VSS, see above. General Appearance: alert, well-developed, well-nourished, NAD Respiratory: Normal respiratory effort. Breath sounds normal, no wheeze/rhonchi/rales Cardiovascular: S1/S2 normal, no murmur/rub/gallop auscultated. RRR. No LE edema.  Gastrointestinal: Nontender, no masses. No hepatomegaly, no splenomegaly. No hernia appreciated. Rectal exam deferred.  Musculoskeletal: Gait normal. No clubbing/cyanosis of digits. Lloyd sign Negative bilateral  Results for orders placed or performed in visit on 04/15/16 (from the past 24 hour(s))  POCT Urinalysis Dipstick     Status: None   Collection Time: 04/15/16  1:55 PM  Result Value Ref Range   Color, UA YELLOW    Clarity, UA CLEAR    Glucose, UA NEGATIVE    Bilirubin, UA NEGATIVE    Ketones, UA NEGATIVE    Spec Grav, UA 1.010    Blood, UA NEGATIVE    pH, UA 5.5    Protein, UA NEGATIVE    Urobilinogen, UA 0.2    Nitrite, UA NEGATIVE    Leukocytes, UA Negative Negative    Previous Culture Results: 06/2015 - reviewed   ASSESSMENT/PLAN:   Await culture, cipro in meantime based on pt preference/last UCx  For LE edema, no pittine edema on exam, see pt instructions, CMP to eval proteins  and lytes  Cystitis - Plan: ciprofloxacin (CIPRO) 500 MG tablet  Frequency of urination - Plan: POCT Urinalysis Dipstick  Pain and swelling of lower extremity, unspecified laterality - Plan: COMPLETE METABOLIC PANEL WITH GFR   Patient advised we will call with urine culture results once available, depending on results may need to change therapy. Return if symptoms worsen or fail to improve and as directed by PCP for routine care.

## 2016-04-15 NOTE — Patient Instructions (Addendum)
Plan:  For UTI -   will call once culture results are available - may not be until Monday.   If worse burning, abdominal pain, fever or nausea - seek care ASAP  For Swelling - most commonly due to high salt foods, dehydration, gravity  Will check labs - should have results back by tomorrow  Elevate feet as much as possible  Stay hydrated, walk around frequently, at least every hour or so

## 2016-04-15 NOTE — Progress Notes (Signed)
Subjective:    I'm seeing this patient as a consultation for:  Dr. Madilyn Fireman  CC: Possible left 3rd and 4th digit trigger finger  HPI: 55 year-old female presents for evaluation of possible left 3rd and 4th digit trigger finger.  Patient reports she first noticed her left 3rd digit start to catch 2 months ago and claims it is getting worse.  Patient states it catches almost every time she flexes and extends her finger and has to use her other hand to relieve it.  She admits to associated pain over her 3rd PIP joint.  Her left 4th digit has also been catching as well, though not as frequently or severely.  Patient reports the triggering is worse in the morning when she wakes up.  She hasn't attempted any medical interventions.  No history of trigger finger.  Denies weakness, numbness, and tingling.    Past medical history, Surgical history, Family history not pertinant except as noted below, Social history, Allergies, and medications have been entered into the medical record, reviewed, and no changes needed.   Review of Systems: No headache, visual changes, nausea, vomiting, diarrhea, constipation, dizziness, abdominal pain, skin rash, fevers, chills, night sweats, weight loss, swollen lymph nodes, body aches, joint swelling, muscle aches, chest pain, shortness of breath, mood changes, visual or auditory hallucinations.   Objective:    Vitals:   04/15/16 1319  BP: 116/69  Pulse: 73  Temp: 97.9 F (36.6 C)   General: Well Developed, well nourished, and in no acute distress.  Neuro/Psych: Alert and oriented x3, extra-ocular muscles intact, able to move all 4 extremities, sensation grossly intact. Skin: Warm and dry, no rashes noted.  Respiratory: Not using accessory muscles, speaking in full sentences, trachea midline.  Cardiovascular: Pulses palpable, no extremity edema. Abdomen: Does not appear distended. MSK: left hand: No obvious deformities.  Full range of motion of PIP, DIP, and MCP  joints.   Triggering of left 3rd and 4th digits with flexion of the PIP.   Left 3rd digit worse than 4th digit.  Needs to use right hand to release the triggering Strength, sensation, and pulses intact   Procedure: Real-time Ultrasound Guided Injection of lt 3rd A1 Pulley and flexor tendon sheeth  Device: GE Logiq E  Images permanently stored and available for review in the ultrasound unit. Verbal informed consent obtained. Discussed risks and benefits of procedure. Warned about infection bleeding damage to structures skin hypopigmentation and fat atrophy among others. Patient expresses understanding and agreement Time-out conducted.  Noted no overlying erythema, induration, or other signs of local infection.  Skin prepped in a sterile fashion.  Local anesthesia: Topical Ethyl chloride.  With sterile technique and under real time ultrasound guidance: 20 mg of Kenalog and 1 mL of lidocaine without epinephrine injected easily.  Completed without difficulty  Pain immediately resolved suggesting accurate placement of the medication.  Advised to call if fevers/chills, erythema, induration, drainage, or persistent bleeding.  Images permanently stored and available for review in the ultrasound unit.  Impression: Technically successful ultrasound guided injection.  Procedure: Real-time Ultrasound Guided Injection of  lt 4th A1 Pulley and flexor tendon sheeth    Device: GE Logiq E  Images permanently stored and available for review in the ultrasound unit. Verbal informed consent obtained. Discussed risks and benefits of procedure. Warned about infection bleeding damage to structures skin hypopigmentation and fat atrophy among others. Patient expresses understanding and agreement Time-out conducted.  Noted no overlying erythema, induration, or other signs of  local infection.  Skin prepped in a sterile fashion.  Local anesthesia: Topical Ethyl chloride.  With sterile technique and  under real time ultrasound guidance: 1 mg of Kenalog and 1 mL of lidocaine without epinephrine. injected easily.  Completed without difficulty  Pain immediately resolved suggesting accurate placement of the medication.  Advised to call if fevers/chills, erythema, induration, drainage, or persistent bleeding.  Images permanently stored and available for review in the ultrasound unit.  Impression: Technically successful ultrasound guided injection.  Lot number for Kenalog: AAP [id number] Lot number for lidocaine [id number]   Double Band-Aid splints applied to the fourth and PIPs  No results found for this or any previous visit (from the past 24 hour(s)). No results found.  Impression and Recommendations:    Assessment and Plan: 55 y.o. female with left 3rd and 4th digit trigger finger. - Steroid injections of the 3rd and 4th digit flexor tendon sheaths - Instructed on double band aid splinting at night - Follow up if no improvement in the next month   Discussed warning signs or symptoms. Please see discharge instructions. Patient expresses understanding.

## 2016-04-16 ENCOUNTER — Telehealth: Payer: Self-pay | Admitting: Osteopathic Medicine

## 2016-04-16 NOTE — Telephone Encounter (Signed)
Please call patient: I reviewed her lab results. Kidney function is stable. Liver enzymes are a little bit higher than they've been when they were last checked 2 months ago, would recommend following up with Dr. Suzi Roots about this, not at a dangerous level that would need to watch this. Electrolytes are fine, nothing really to explain the swelling, nothing else concerning. Plan to follow-up as discussed at visit

## 2016-04-17 LAB — URINE CULTURE: Organism ID, Bacteria: NO GROWTH

## 2016-04-19 NOTE — Telephone Encounter (Signed)
PATIENT HAS BEEN INFORMED. Mandy Shaw,CMA  

## 2016-04-20 ENCOUNTER — Encounter (HOSPITAL_COMMUNITY): Payer: Self-pay | Admitting: Psychiatry

## 2016-04-20 ENCOUNTER — Ambulatory Visit (INDEPENDENT_AMBULATORY_CARE_PROVIDER_SITE_OTHER): Payer: BLUE CROSS/BLUE SHIELD | Admitting: Psychiatry

## 2016-04-20 ENCOUNTER — Telehealth: Payer: Self-pay | Admitting: *Deleted

## 2016-04-20 VITALS — BP 112/70 | HR 60 | Ht 67.0 in | Wt 118.4 lb

## 2016-04-20 DIAGNOSIS — F4310 Post-traumatic stress disorder, unspecified: Secondary | ICD-10-CM | POA: Diagnosis not present

## 2016-04-20 DIAGNOSIS — F411 Generalized anxiety disorder: Secondary | ICD-10-CM

## 2016-04-20 DIAGNOSIS — G4700 Insomnia, unspecified: Secondary | ICD-10-CM | POA: Diagnosis not present

## 2016-04-20 MED ORDER — MIRTAZAPINE 15 MG PO TABS: 15.0000 mg | ORAL_TABLET | Freq: Every day | ORAL | 1 refills | Status: DC

## 2016-04-20 MED ORDER — DULOXETINE HCL 60 MG PO CPEP: 120.0000 mg | ORAL_CAPSULE | Freq: Every day | ORAL | 1 refills | Status: DC

## 2016-04-20 MED ORDER — TRAZODONE HCL 100 MG PO TABS: 100.0000 mg | ORAL_TABLET | Freq: Every day | ORAL | 1 refills | Status: DC

## 2016-04-20 MED ORDER — LAMOTRIGINE 200 MG PO TABS: 200.0000 mg | ORAL_TABLET | Freq: Every day | ORAL | 1 refills | Status: DC
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
## 2016-04-20 NOTE — Telephone Encounter (Signed)
PA submitted through covermymeds for

## 2016-04-22 NOTE — Telephone Encounter (Signed)
rozerem denied.appeal letter sent because she has tried [place]

## 2016-04-28 ENCOUNTER — Telehealth: Payer: Self-pay | Admitting: Family Medicine

## 2016-04-28 ENCOUNTER — Telehealth: Payer: Self-pay | Admitting: *Deleted

## 2016-04-28 DIAGNOSIS — G43719 Chronic migraine without aura, intractable, without status migrainosus: Secondary | ICD-10-CM | POA: Diagnosis not present

## 2016-04-28 NOTE — Telephone Encounter (Signed)
Call patient: Received her lab results including a CMP. Kidney function is back down to 1.2 which is good. It had gone up last time. Liver enzymes are still just mildly elevated but not in the dangerous range. Thyroid level looks great at 1.3.

## 2016-04-28 NOTE — Telephone Encounter (Signed)
Told to call customer service [phone] opt #[phone]. They will fax results.Mandy Shaw [place]

## 2016-04-28 NOTE — Telephone Encounter (Signed)
Called and informed pt of results she would like this sent to Dr. Marcheta Grammes office.Mandy Shaw    Lab results faxed ,..Mandy Shaw

## 2016-04-28 NOTE — Telephone Encounter (Signed)
Called pt and informed her that we have not received her results of the tsh. I will call [facility] [phone]. Also informed her that a PA has been started for the sleep medication.Mandy Shaw

## 2016-04-29 DIAGNOSIS — N816 Rectocele: Secondary | ICD-10-CM | POA: Insufficient documentation

## 2016-04-29 DIAGNOSIS — K5909 Other constipation: Secondary | ICD-10-CM | POA: Diagnosis not present

## 2016-04-29 DIAGNOSIS — K648 Other hemorrhoids: Secondary | ICD-10-CM | POA: Diagnosis not present

## 2016-04-29 NOTE — Telephone Encounter (Signed)
Rozeram approved through insurance. Patient notified. Patient wants to know if she need to d/c Trazodone and mirtazipine. Please advise

## 2016-04-30 NOTE — Telephone Encounter (Signed)
Left message on patients vm

## 2016-04-30 NOTE — Telephone Encounter (Signed)
Yes, cut trazodone in half and take half a tab daily for 3 days and then stop.    Ok to continue the mirtazapine.    Reminded her that she will need to take the Rozerem nightly for a couple of weeks before deciding how effective she feels that it is.

## 2016-05-03 ENCOUNTER — Telehealth: Payer: Self-pay

## 2016-05-03 NOTE — Telephone Encounter (Signed)
Patient asked if she needs a lipid panel. I advised her she had a lipid panel in June.

## 2016-05-04 ENCOUNTER — Encounter: Payer: Self-pay | Admitting: Family Medicine

## 2016-05-04 ENCOUNTER — Telehealth: Payer: Self-pay | Admitting: Family Medicine

## 2016-05-04 ENCOUNTER — Ambulatory Visit (INDEPENDENT_AMBULATORY_CARE_PROVIDER_SITE_OTHER): Payer: BLUE CROSS/BLUE SHIELD | Admitting: Family Medicine

## 2016-05-04 VITALS — BP 94/54 | HR 69 | Resp 16 | Ht 67.0 in | Wt 116.0 lb

## 2016-05-04 DIAGNOSIS — E46 Unspecified protein-calorie malnutrition: Secondary | ICD-10-CM | POA: Diagnosis not present

## 2016-05-04 DIAGNOSIS — N959 Unspecified menopausal and perimenopausal disorder: Secondary | ICD-10-CM | POA: Diagnosis not present

## 2016-05-04 DIAGNOSIS — Z0189 Encounter for other specified special examinations: Secondary | ICD-10-CM | POA: Diagnosis not present

## 2016-05-04 DIAGNOSIS — R634 Abnormal weight loss: Secondary | ICD-10-CM

## 2016-05-04 DIAGNOSIS — R29810 Facial weakness: Secondary | ICD-10-CM

## 2016-05-04 DIAGNOSIS — G4700 Insomnia, unspecified: Secondary | ICD-10-CM | POA: Diagnosis not present

## 2016-05-04 DIAGNOSIS — R636 Underweight: Secondary | ICD-10-CM

## 2016-05-04 DIAGNOSIS — L603 Nail dystrophy: Secondary | ICD-10-CM | POA: Diagnosis not present

## 2016-05-04 DIAGNOSIS — Z0000 Encounter for general adult medical examination without abnormal findings: Secondary | ICD-10-CM

## 2016-05-04 MED ORDER — ESTRADIOL-NORETHINDRONE ACET 0.5-0.1 MG PO TABS: 1.0000 | ORAL_TABLET | Freq: Every day | ORAL | 2 refills | Status: DC

## 2016-05-04 NOTE — Progress Notes (Signed)
Subjective:   Mandy Shaw is a 55 y.o. female who presents for Medicare Annual (Subsequent) preventive examination.  He does have some additional concerns today. She feels like she is not sleeping well. We have switch her to Rozerem 8 mg at bedtime. She says she's waking up frequently. She avoids caffeine except she does have 2 cups of coffee first thing in the morning. She does admit that part of her sleep issues include waking up with night sweats. She actually is interested in trying hormone replacement therapy. She also has vaginal dryness and hot flashes. She we have discussed this previously and I have recommended a trial of some over-the-counter medications. She felt these were not helpful. Her last period was approximately 2 years ago in October 2015. Next  She is also concerned because she still feels like there is a drawing sensation at the base for mouth. Worse on the left compared to the right. In fact sometimes she will even droll along the side of the mouth and not even realize it. She also feels like sometimes when she's watching TV her left eye will actually start to close. She feels like it's almost a week. Next  She also brought in a form from her home evaluation for Medicare wellness with some recommendations. She does have some questions about advanced directives.  He also complains that her nails have become more brittle over time. She is to great amount quite long and now they are so short she has had to go and get artificial nails. She has been taking a new nail, skin, hair multivitamin for the last couple of years but it doesn't seem to be helping.   Review of Systems:  Conference of review of systems negative except for history of present illness as above.       Objective:     Vitals: BP (!) 94/54 (BP Location: Left Arm, Patient Position: Sitting, Cuff Size: Normal)   Pulse 69   Resp 16   Ht 5\' 7"  (1.702 m)   Wt 116 lb (52.6 kg)   LMP 06/20/2014   SpO2 100%    BMI 18.17 kg/m   Body mass index is 18.17 kg/m.  Physical Exam  Constitutional: She is oriented to person, place, and time. She appears well-developed and well-nourished.  HENT:  Head: Normocephalic and atraumatic.  Right Ear: External ear normal.  Left Ear: External ear normal.  Nose: Nose normal.  Mouth/Throat: Oropharynx is clear and moist.  TMs and canals are clear.   Eyes: Conjunctivae and EOM are normal. Pupils are equal, round, and reactive to light.  Neck: Neck supple. No thyromegaly present.  Cardiovascular: Normal rate, regular rhythm and normal heart sounds.   Pulmonary/Chest: Effort normal and breath sounds normal. She has no wheezes.  Lymphadenopathy:    She has no cervical adenopathy.  Neurological: She is alert and oriented to person, place, and time. A cranial nerve deficit is present.  Asymmetric smile.  Left side doesn't raise like the right.  Unable to crease eyebrow on the the left.   Skin: Skin is warm and dry.  Psychiatric: She has a normal mood and affect.     Tobacco History  Smoking Status  . Former Smoker  . Packs/day: 1.50  . Years: 40.00  . Types: Cigarettes  . Quit date: 10/22/2015  Smokeless Tobacco  . Never Used     Counseling given: Not Answered   Past Medical History:  Diagnosis Date  . Arthritis   .  Bipolar 1 disorder ([place])   . COPD (chronic obstructive pulmonary disease) ([place])   . GERD (gastroesophageal reflux disease)   . Insomnia   . 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 Personality disorder   . Thyroid disease    Past Surgical History:  Procedure Laterality Date  . CARDIAC SURGERY     flap on heart removed  . CESAREAN SECTION    . CHOLECYSTECTOMY    . CONDYLOMA EXCISION/FULGURATION    . EYE SURGERY    . INCONTINENCE SURGERY    . INDUCED ABORTION    . LAPAROTOMY     exploratory for pain  . LEEP    . Left hip surgery  01/09/14   Dr. Arnette Norris  . right hip surgery  09/26/2013   Dr. Arnette Norris  . SHOULDER SURGERY Right 2015   Dr. Margretta Sidle  . THYROIDECTOMY     Family History  Problem Relation Age of Onset  . Alcohol abuse Father    History  Sexual Activity  . Sexual activity: Yes  . Partners: Male    Comment: married, 1 daughter, 1  1/2 yrs college, self-employed, no reg exercise, 3 caffeine drinks daily.    Outpatient Encounter Prescriptions as of 05/04/2016  Medication Sig  . BIOTIN PO Take by mouth.  . Calcium Carb-Cholecalciferol (CALCIUM 600 + D PO) Take by mouth.  . DULoxetine (CYMBALTA) 60 MG capsule Take 2 capsules (120 mg total) by mouth daily.  . famotidine (PEPCID) 20 MG tablet take 1 tablet by mouth once daily  . lactulose (CHRONULAC) 10 GM/15ML solution Take 30 mLs by mouth 3 (three) times daily.  Marland Kitchen lamoTRIgine (LAMICTAL) 200 MG tablet Take 1 tablet (200 mg total) by mouth daily. One per day.  . levothyroxine (SYNTHROID, LEVOTHROID) 125 MCG tablet take 1 tablet by mouth daily  . mirtazapine (REMERON) 15 MG tablet Take 1 tablet (15 mg total) by mouth at bedtime. One at night  . primidone (MYSOLINE) 50 MG tablet Take 50 mg by mouth 2 (two) times daily.  . propranolol ER (INDERAL LA) 60 MG 24 hr capsule Take 1 capsule by mouth at bedtime.  Marland Kitchen RA SENNA 8.6 MG tablet Take 2 tablets by mouth 2 (two) times daily.  . simvastatin (ZOCOR) 20 MG tablet Take 20 mg by mouth daily.  . SUMAtriptan (IMITREX) 100 MG tablet TAKE 1 TABLET BY MOUTH AT ONSET OF HEADACHE. MAY REPEAT 2 HOURS L...  (REFER TO PRESCRIPTION NOTES).  . Vitamins/Minerals TABS Take 1 tablet by mouth.  . [DISCONTINUED] omega-3 acid ethyl esters (LOVAZA) 1 g capsule take 4 capsules by mouth once daily  . [DISCONTINUED] ramelteon (ROZEREM) 8 MG tablet Take 1 tablet (8 mg total) by mouth at bedtime.  . Estradiol-Norethindrone Acet 0.5-0.1 MG tablet Take 1 tablet by mouth daily.  . [DISCONTINUED] ciprofloxacin (CIPRO) 500 MG tablet Take 1 tablet (500 mg total) by mouth 2 (two) times daily.  .  [DISCONTINUED] traZODone (DESYREL) 100 MG tablet Take 1 tablet (100 mg total) by mouth at bedtime.   No facility-administered encounter medications on file as of 05/04/2016.     Activities of Daily Living In your present state of health, do you have any difficulty performing the following activities: 05/04/2016  Hearing? N  Vision? N  Difficulty concentrating or making decisions? N  Walking or climbing stairs? N  Dressing or bathing? N  Doing errands, shopping? N  Some recent data might be hidden    Patient  Care Team: Hali Marry, MD as PCP - Eldred, MD as Referring Physician (Specialist) Candy Sledge, DO as Referring Physician (Nephrology)    Assessment:    Medicare Wellness Exam  Exercise Activities and Dietary recommendations    Goals    None     Fall Risk Fall Risk  05/04/2016  Falls in the past year? No   Depression Screen No flowsheet data found.   Cognitive Testing No flowsheet data found.  Immunization History  Administered Date(s) Administered  . Influenza Whole 07/21/2009  . Influenza,inj,Quad PF,36+ Mos 05/22/2015  . Influenza-Unspecified 06/20/2013  . Pneumococcal Polysaccharide-23 12/10/2011  . Tdap 12/10/2011  . Zoster 08/02/2013   Screening Tests Health Maintenance  Topic Date Due  . HIV Screening  12/24/1975  . INFLUENZA VACCINE  04/20/2016  . MAMMOGRAM  09/02/2017  . PAP SMEAR  02/06/2019  . TETANUS/TDAP  12/09/2021  . COLONOSCOPY  06/21/2023  . Hepatitis C Screening  Completed      Plan:   1.  Wellness exam.  During the course of the visit the patient was educated and counseled about the following appropriate screening and preventive services:   Vaccines UTD, recommend flu shot this fall  Cardiovascular Disease  Colorectal cancer screening - UTD.   Bone density screening  Diabetes screening  Glaucoma screening - UTD  Mammography/PAP  - UTD  Patient Instructions (the written plan) was given  to the patient.   Diyari Cherne, MD  05/04/2016   2. Postmenopausal symptoms-we discussed pros and cons of hormone replacement therapy including increased cardiac risk and breast cancer risk. Also discussed these can only be you short-term. She still has a uterus and will need combination therapy. Start Activella low dose. F/U in 6-8 weeks.    3. Insomnia, chronic - did verify that Rozerem 8 mg is the highest strength. We could consider switching her to doxepin. Though risk for QT prolongation with her other meds. I will reach out to Dr. Nicole Cella and see if he has any thoughts.   4. Left facial droop - recommend MRI of brain for further evaluation.    5. brittle nails - will check for nutritional deficiency. Fax order slip down stairs.    6. The complains of some very fine bumps in tiny 1 mm scabs that pop up intermittently on her skin. They always seemed to resolve so unlikely to be actinic keratoses. Will monitor carefully.

## 2016-05-04 NOTE — Telephone Encounter (Signed)
Please call patient: I did not see any any scans of her brain in our system or in care everywhere. When did she have a scan done?

## 2016-05-05 DIAGNOSIS — K5900 Constipation, unspecified: Secondary | ICD-10-CM | POA: Diagnosis not present

## 2016-05-05 DIAGNOSIS — N816 Rectocele: Secondary | ICD-10-CM | POA: Diagnosis not present

## 2016-05-05 NOTE — Telephone Encounter (Signed)
Ok, let's order MRI brain for possible stroke.

## 2016-05-05 NOTE — Telephone Encounter (Signed)
Patient states she did not have a brain scan.

## 2016-05-06 ENCOUNTER — Other Ambulatory Visit: Payer: Self-pay | Admitting: Family Medicine

## 2016-05-06 DIAGNOSIS — N1832 Chronic kidney disease, stage 3b: Secondary | ICD-10-CM

## 2016-05-06 DIAGNOSIS — N816 Rectocele: Secondary | ICD-10-CM | POA: Diagnosis not present

## 2016-05-06 DIAGNOSIS — K5900 Constipation, unspecified: Secondary | ICD-10-CM | POA: Diagnosis not present

## 2016-05-06 DIAGNOSIS — N183 Chronic kidney disease, stage 3 (moderate): Principal | ICD-10-CM

## 2016-05-06 NOTE — Telephone Encounter (Signed)
Patient advised.

## 2016-05-06 NOTE — Telephone Encounter (Signed)
Ordered MRI 

## 2016-05-07 DIAGNOSIS — K5900 Constipation, unspecified: Secondary | ICD-10-CM | POA: Diagnosis not present

## 2016-05-07 DIAGNOSIS — N816 Rectocele: Secondary | ICD-10-CM | POA: Diagnosis not present

## 2016-05-07 LAB — COMPLETE METABOLIC PANEL WITH GFR
ALBUMIN: 4.6
ALK PHOS: 95 U/L
ALT: 35
AST: 40 U/L
BUN: 36
Bilirubin: <0.2
CHLORIDE: 103 mmol/L
CREATININE: 1.59
Calcium: 9.4 mg/dL
Carbon Dioxide, Total: 22
EGFR: 36
GFR CALC AF AMER: 42
GLUCOSE: 84 mg/dL
Globulin: 2.1
POTASSIUM: 6 mmol/L
PROTEIN: 6.7
SODIUM: 144

## 2016-05-08 ENCOUNTER — Ambulatory Visit (HOSPITAL_BASED_OUTPATIENT_CLINIC_OR_DEPARTMENT_OTHER): Admission: RE | Admit: 2016-05-08 | Payer: BLUE CROSS/BLUE SHIELD | Source: Ambulatory Visit

## 2016-05-10 ENCOUNTER — Telehealth: Payer: Self-pay | Admitting: Family Medicine

## 2016-05-10 ENCOUNTER — Other Ambulatory Visit: Payer: Self-pay

## 2016-05-10 ENCOUNTER — Encounter: Payer: Self-pay | Admitting: Family Medicine

## 2016-05-10 DIAGNOSIS — E875 Hyperkalemia: Secondary | ICD-10-CM

## 2016-05-10 NOTE — Telephone Encounter (Signed)
Patient advised of results and recommendations. Faxed lab order to [facility] [phone]

## 2016-05-10 NOTE — Progress Notes (Signed)
pota

## 2016-05-10 NOTE — Telephone Encounter (Signed)
Call patient: Potassium is slightly elevated and one of the liver enzymes is borderline on her blood work that she had done on August 17. Kidney function is still mildly elevated but stable. Though she does look a little dry on the blood work. Make sure hydrating well and recommend repeat potassium sometime this week. She's not really on any medications that should be causing the elevation. It could also have been from a little bit of hemolysis from the blood draw itself.  Beatrice Lecher, MD

## 2016-05-11 ENCOUNTER — Encounter: Payer: Self-pay | Admitting: Family Medicine

## 2016-05-11 ENCOUNTER — Telehealth: Payer: Self-pay | Admitting: *Deleted

## 2016-05-11 ENCOUNTER — Telehealth: Payer: Self-pay | Admitting: Family Medicine

## 2016-05-11 MED ORDER — DOXEPIN HCL 3 MG PO TABS
3.0000 mg | ORAL_TABLET | Freq: Every day | ORAL | 1 refills | Status: DC
Start: 2016-05-11 — End: 2016-05-30

## 2016-05-11 NOTE — Telephone Encounter (Signed)
Pt informed of recommendations. She wanted to know if she would still be taking the lorazepam? I told her that I would fwd this to Dr. Madilyn Fireman for f/u.[name]

## 2016-05-11 NOTE — Telephone Encounter (Signed)
PA submitted for Silenor ( doxepin)    Key: [id number]

## 2016-05-11 NOTE — Telephone Encounter (Signed)
Call pt: spoke with Dr. Nicole Cella. We can try doxepin for the sleep as long as she is not on primidone.  I will send over a new rx.  If the new prescription is too expensive let me know. The 10 mg dose is sometimes cheaper but I would like to start her with the 3 mg dose to see if she will tolerate that first.

## 2016-05-12 ENCOUNTER — Encounter: Payer: Self-pay | Admitting: Family Medicine

## 2016-05-12 NOTE — Telephone Encounter (Signed)
Off of the lorazepam.  Benzos really aren't the best sleep medicine bc they can cause a rebound effect.

## 2016-05-12 NOTE — Telephone Encounter (Signed)
Left message advising patient of recommendations.  °

## 2016-05-13 NOTE — Telephone Encounter (Signed)
Mandy Shaw is currently taking Remeron for sleep. She wants to know if she is to continue the Remeron when she starts the doxepin. Please advise.

## 2016-05-14 NOTE — Telephone Encounter (Signed)
Cut remeron in half for 2 nights and then stop and try the doxepin

## 2016-05-15 ENCOUNTER — Ambulatory Visit (HOSPITAL_BASED_OUTPATIENT_CLINIC_OR_DEPARTMENT_OTHER)
Admission: RE | Admit: 2016-05-15 | Discharge: 2016-05-15 | Disposition: A | Discharge status: Home / Self Care | Payer: BLUE CROSS/BLUE SHIELD | Source: Ambulatory Visit | Attending: Family Medicine | Admitting: Family Medicine

## 2016-05-15 DIAGNOSIS — R29810 Facial weakness: Secondary | ICD-10-CM | POA: Insufficient documentation

## 2016-05-15 DIAGNOSIS — R531 Weakness: Secondary | ICD-10-CM | POA: Diagnosis not present

## 2016-05-15 MED ORDER — GADOBENATE DIMEGLUMINE 529 MG/ML IV SOLN: 5.0000 mL | Freq: Once | INTRAVENOUS | Status: DC | PRN

## 2016-05-17 ENCOUNTER — Telehealth: Payer: Self-pay | Admitting: *Deleted

## 2016-05-17 DIAGNOSIS — R29810 Facial weakness: Secondary | ICD-10-CM

## 2016-05-17 NOTE — Telephone Encounter (Signed)
Referral placed.

## 2016-05-17 NOTE — Telephone Encounter (Signed)
Patient advised of recommendations.  

## 2016-05-20 ENCOUNTER — Other Ambulatory Visit: Payer: Self-pay | Admitting: Family Medicine

## 2016-05-21 DIAGNOSIS — N816 Rectocele: Secondary | ICD-10-CM | POA: Diagnosis not present

## 2016-05-21 DIAGNOSIS — K5900 Constipation, unspecified: Secondary | ICD-10-CM | POA: Diagnosis not present

## 2016-05-21 DIAGNOSIS — R339 Retention of urine, unspecified: Secondary | ICD-10-CM | POA: Diagnosis not present

## 2016-05-26 ENCOUNTER — Telehealth: Payer: Self-pay | Admitting: *Deleted

## 2016-05-26 MED ORDER — DOXEPIN HCL 10 MG PO CAPS: 10.0000 mg | ORAL_CAPSULE | Freq: Every evening | ORAL | 0 refills | Status: DC | PRN

## 2016-05-26 NOTE — Telephone Encounter (Signed)
Sometimes the 10mg  tab is cheaper. I will send over new rx for this dose.

## 2016-05-26 NOTE — Telephone Encounter (Signed)
Pt called and stated that the sleep medication is too expensive it will cost her $40. Also she will need to discontinue the estrogen due to cost $20. I informed her that a PA has been started on the Doxepin however, we have not heard back about whether or not if it has been approved. Until then she would like to get something that is more cost effective for her to take for sleep. Will fwd to pcp for advice.Mandy Shaw [place]

## 2016-05-28 DIAGNOSIS — R339 Retention of urine, unspecified: Secondary | ICD-10-CM | POA: Diagnosis not present

## 2016-05-28 DIAGNOSIS — N816 Rectocele: Secondary | ICD-10-CM | POA: Diagnosis not present

## 2016-05-28 DIAGNOSIS — K5900 Constipation, unspecified: Secondary | ICD-10-CM | POA: Diagnosis not present

## 2016-05-28 NOTE — Telephone Encounter (Signed)
Cut the trazodone in half for a couple of nights and then stop.

## 2016-05-28 NOTE — Telephone Encounter (Signed)
Pt informed. She p/u the doxepin it was approved. She wanted to know if she should still be taking the trazadone?

## 2016-05-28 NOTE — Telephone Encounter (Signed)
Pt advised about recommendation Trazodone Rx, verbalized understanding. Pt questions if she should still be taking primidone Rx. Will route.

## 2016-05-30 ENCOUNTER — Other Ambulatory Visit: Payer: Self-pay | Admitting: Family Medicine

## 2016-05-30 NOTE — Telephone Encounter (Signed)
I don't have that on her med list.  Who writes taht for her?

## 2016-05-31 ENCOUNTER — Other Ambulatory Visit: Payer: Self-pay

## 2016-05-31 NOTE — Telephone Encounter (Signed)
Ok to take the doxepin.

## 2016-05-31 NOTE — Telephone Encounter (Signed)
What dose is she on?

## 2016-05-31 NOTE — Telephone Encounter (Signed)
Pt states the Primidone is written by Dr. Trula Ore. Pt is now off the Trazodone completely.

## 2016-05-31 NOTE — Telephone Encounter (Signed)
Pt advised, no further questions.

## 2016-05-31 NOTE — Telephone Encounter (Signed)
Primidone 50mg  tablets. I have reconciled this Rx to her current med list.

## 2016-06-03 DIAGNOSIS — H02409 Unspecified ptosis of unspecified eyelid: Secondary | ICD-10-CM | POA: Diagnosis not present

## 2016-06-03 DIAGNOSIS — M5441 Lumbago with sciatica, right side: Secondary | ICD-10-CM | POA: Diagnosis not present

## 2016-06-03 DIAGNOSIS — M5442 Lumbago with sciatica, left side: Secondary | ICD-10-CM | POA: Diagnosis not present

## 2016-06-03 DIAGNOSIS — M542 Cervicalgia: Secondary | ICD-10-CM | POA: Diagnosis not present

## 2016-06-04 DIAGNOSIS — R339 Retention of urine, unspecified: Secondary | ICD-10-CM | POA: Diagnosis not present

## 2016-06-04 DIAGNOSIS — R350 Frequency of micturition: Secondary | ICD-10-CM | POA: Diagnosis not present

## 2016-06-04 DIAGNOSIS — K5900 Constipation, unspecified: Secondary | ICD-10-CM | POA: Diagnosis not present

## 2016-06-04 DIAGNOSIS — N816 Rectocele: Secondary | ICD-10-CM | POA: Diagnosis not present

## 2016-06-08 DIAGNOSIS — R339 Retention of urine, unspecified: Secondary | ICD-10-CM | POA: Diagnosis not present

## 2016-06-08 DIAGNOSIS — K5900 Constipation, unspecified: Secondary | ICD-10-CM | POA: Diagnosis not present

## 2016-06-08 DIAGNOSIS — N816 Rectocele: Secondary | ICD-10-CM | POA: Diagnosis not present

## 2016-06-09 ENCOUNTER — Telehealth: Payer: Self-pay | Admitting: Family Medicine

## 2016-06-09 ENCOUNTER — Encounter (HOSPITAL_COMMUNITY): Payer: Self-pay | Admitting: Psychiatry

## 2016-06-09 ENCOUNTER — Ambulatory Visit (INDEPENDENT_AMBULATORY_CARE_PROVIDER_SITE_OTHER): Payer: BLUE CROSS/BLUE SHIELD | Admitting: Psychiatry

## 2016-06-09 VITALS — BP 92/60 | HR 66 | Resp 14 | Ht 67.0 in | Wt 126.6 lb

## 2016-06-09 DIAGNOSIS — F4310 Post-traumatic stress disorder, unspecified: Secondary | ICD-10-CM

## 2016-06-09 DIAGNOSIS — F319 Bipolar disorder, unspecified: Secondary | ICD-10-CM

## 2016-06-09 DIAGNOSIS — F411 Generalized anxiety disorder: Secondary | ICD-10-CM | POA: Diagnosis not present

## 2016-06-09 DIAGNOSIS — G4700 Insomnia, unspecified: Secondary | ICD-10-CM | POA: Diagnosis not present

## 2016-06-09 LAB — LIPID PANEL
CHOLESTEROL: 229 mg/dL — AB (ref 0–200)
HDL: 107 mg/dL — AB (ref 35–70)
LDL CALC: 95 mg/dL
Triglycerides: 136 mg/dL (ref 40–160)

## 2016-06-09 LAB — HEPATIC FUNCTION PANEL
ALK PHOS: 86 U/L (ref 25–125)
ALT: 55 U/L — AB (ref 7–35)
AST: 36 U/L — AB (ref 13–35)

## 2016-06-09 LAB — BASIC METABOLIC PANEL
BUN: 40 mg/dL — AB (ref 4–21)
Creatinine: 1.6 mg/dL — AB (ref 0.5–1.1)
Sodium: 145 mmol/L (ref 137–147)

## 2016-06-09 MED ORDER — LAMOTRIGINE 200 MG PO TABS: 200.0000 mg | ORAL_TABLET | Freq: Every day | ORAL | 1 refills | Status: DC

## 2016-06-09 MED ORDER — MIRTAZAPINE 7.5 MG PO TABS
7.5000 mg | ORAL_TABLET | Freq: Every day | ORAL | 1 refills | Status: DC
Start: 2016-06-09 — End: 2016-07-14

## 2016-06-09 MED ORDER — DULOXETINE HCL 60 MG PO CPEP: 120.0000 mg | ORAL_CAPSULE | Freq: Every day | ORAL | 1 refills | Status: DC

## 2016-06-09 MED ORDER — TRAZODONE HCL 100 MG PO TABS: 100.0000 mg | ORAL_TABLET | Freq: Every day | ORAL | 1 refills | Status: DC
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
## 2016-06-09 NOTE — Progress Notes (Signed)
Per Pt is now back taking the trazodone and remeron. She will stop the doxepin.

## 2016-06-09 NOTE — Telephone Encounter (Signed)
Pt reported she is taking the Doxepin. Upon review of her chart, Dr. Nicole Cella wrote a new Rx for trazodone today.

## 2016-06-09 NOTE — Telephone Encounter (Signed)
Pt states Dr. Nicole Cella advised to stop the doxepin. Pt is now back taking the trazodone and remeron. She will stop the doxepin. No further questions.

## 2016-06-09 NOTE — Telephone Encounter (Signed)
Pt called clinic today stating she has not been sleeping at night since her medication changes. Pt is now off the Remeron and Trazodone. States she will "doze off" then wake up, no "deep sleep." will route to PCP for recommendation.

## 2016-06-09 NOTE — Progress Notes (Signed)
Has she started the doxepin for sleep?  I had sent over a new rx for 10mg  since the lower dose wasn't covered.

## 2016-06-09 NOTE — Telephone Encounter (Signed)
OK, sounds good. She can try that instead.

## 2016-06-09 NOTE — Telephone Encounter (Signed)
Has she started the doxepin yet? I had sent over the 10mg  strenght for her to try for 2 weeks. I know the lower strength dose wasn't covered on her plan.

## 2016-06-10 ENCOUNTER — Other Ambulatory Visit: Payer: Self-pay | Admitting: Family Medicine

## 2016-06-10 ENCOUNTER — Ambulatory Visit (HOSPITAL_COMMUNITY): Payer: Self-pay | Admitting: Psychiatry

## 2016-06-10 DIAGNOSIS — N816 Rectocele: Secondary | ICD-10-CM | POA: Diagnosis not present

## 2016-06-14 ENCOUNTER — Telehealth: Payer: Self-pay | Admitting: Family Medicine

## 2016-06-14 ENCOUNTER — Ambulatory Visit (INDEPENDENT_AMBULATORY_CARE_PROVIDER_SITE_OTHER): Payer: BLUE CROSS/BLUE SHIELD | Admitting: Podiatry

## 2016-06-14 ENCOUNTER — Encounter: Payer: Self-pay | Admitting: Podiatry

## 2016-06-14 ENCOUNTER — Other Ambulatory Visit (HOSPITAL_COMMUNITY): Payer: Self-pay | Admitting: Psychiatry

## 2016-06-14 DIAGNOSIS — G5750 Tarsal tunnel syndrome, unspecified lower limb: Secondary | ICD-10-CM

## 2016-06-14 DIAGNOSIS — L600 Ingrowing nail: Secondary | ICD-10-CM

## 2016-06-14 DIAGNOSIS — G609 Hereditary and idiopathic neuropathy, unspecified: Secondary | ICD-10-CM

## 2016-06-14 DIAGNOSIS — M79673 Pain in unspecified foot: Secondary | ICD-10-CM

## 2016-06-14 NOTE — Telephone Encounter (Signed)
Pt lvm stating she is not sleeping well. Pt would like to increase Trazodone rx. Please advise.

## 2016-06-14 NOTE — Patient Instructions (Signed)
Seen for painful ingrown nails and burning foot pain on both feet. 3 months were too long interval. 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  All nails and grown nails debrided. Discussed the benefit of Physical therapy by home health care. Will make referral on physical therapy. Return for routine foot care in 2 months.

## 2016-06-14 NOTE — Telephone Encounter (Signed)
Patient called advised she dropped off a insurance form 2 wks ago in an envelope and stated it needed to be faxed to [facility] and it has not would like a call back in regard to form. Thanks

## 2016-06-14 NOTE — Progress Notes (Signed)
SUBJECTIVE: 55 y.o. year old female presents stating that there is something going on with her foot that she forgot to mention during her last visit. Both feet has shooting pain on top just below ankle joint day or night, sitting or sleeping. Making her to rub her foot. The condition is worse after been walking for a while.  Also stated that 3 months interval is too long for her. Nails get too painful to handle if waited that long. Patient requests to come in sooner than 3 months.   Co existing issues: Has had problem with both knees. Hurt to lift up from squatting position or going up on stairs.   OBJECTIVE: DERMATOLOGIC EXAMINATION: Nails: History of both great toe nail surgeries on both borders in past, which resulted in irregular nail border with pain on both great toes. No open skin or drainage noted.  VASCULAR EXAMINATION OF LOWER LIMBS: Pedal pulses: All pedal pulses are faintly palpable. Capillary Filling times within 3 seconds in all digits.  Temperature gradient from tibial crest to dorsum of foot is within normal bilateral. NEUROLOGIC EXAMINATION OF THE LOWER LIMBS: Subjective burning pain at dorsum of both feet, gets worse after prolonged ambulation. Pain at rest in bed or sitting in chair. Some times pain is when the legs are crossed.  Has had failed to respond to Monofilament sensory testing with normal vibratory sensory.  MUSCULOSKELETAL EXAMINATION: Neurologic symptoms just distal to ankle anterior retinaculum bilateral.  ASSESSMENT: 1. Anterior Tarsal tunnel syndrome bilateral. 2. Symptomatic ingrown nails both great toes due to residual nail growth following nail ingrown nail surgery. 3. Peripheral neuropathy. 4. Pain in lower limb.   PLAN: Reviewed clinical findings and available treatment options. Discussed the nature of Anterior Tarsal tunnel syndrome.  All nails and grown nails debrided. Discussed the benefit of Physical therapy by home health care. Referral  made for physical therapy to stretch tight Anterior ankle retinaculum. . Return for routine foot care in 2 months.

## 2016-06-15 DIAGNOSIS — K5900 Constipation, unspecified: Secondary | ICD-10-CM | POA: Diagnosis not present

## 2016-06-15 NOTE — Telephone Encounter (Signed)
Can increase zoloft to 100mg . She has 50mg  tablets start taking 2. But would need earlier appointment to follow up .  Report to ed for any urgent evaluation or conern.  Staff to talk to patient directly as well.

## 2016-06-15 NOTE — Telephone Encounter (Signed)
Previous note error.  Patient can increase trazadone to 150mg  . She can use 100mg  tablet and take one and one half to complete 150mg  No further increase after that. Will need to schedule with sleep specialist if needed

## 2016-06-15 NOTE — Addendum Note (Signed)
Addended by: Camelia Phenes on: 06/15/2016 04:23 PM   Modules accepted: Orders

## 2016-06-15 NOTE — Telephone Encounter (Signed)
Pt calling to check status of this. Pt has not heard anything.

## 2016-06-16 ENCOUNTER — Encounter: Payer: Self-pay | Admitting: *Deleted

## 2016-06-17 ENCOUNTER — Ambulatory Visit: Payer: BLUE CROSS/BLUE SHIELD | Admitting: Podiatry

## 2016-06-18 ENCOUNTER — Ambulatory Visit: Payer: Self-pay | Admitting: Family Medicine

## 2016-06-22 DIAGNOSIS — N183 Chronic kidney disease, stage 3 (moderate): Secondary | ICD-10-CM | POA: Diagnosis not present

## 2016-06-22 DIAGNOSIS — R350 Frequency of micturition: Secondary | ICD-10-CM | POA: Diagnosis not present

## 2016-06-22 DIAGNOSIS — R339 Retention of urine, unspecified: Secondary | ICD-10-CM | POA: Diagnosis not present

## 2016-06-22 DIAGNOSIS — R358 Other polyuria: Secondary | ICD-10-CM | POA: Insufficient documentation

## 2016-06-22 DIAGNOSIS — N281 Cyst of kidney, acquired: Secondary | ICD-10-CM | POA: Diagnosis not present

## 2016-06-22 DIAGNOSIS — K5900 Constipation, unspecified: Secondary | ICD-10-CM | POA: Diagnosis not present

## 2016-06-22 DIAGNOSIS — R3589 Other polyuria: Secondary | ICD-10-CM | POA: Insufficient documentation

## 2016-06-22 DIAGNOSIS — E232 Diabetes insipidus: Secondary | ICD-10-CM | POA: Diagnosis not present

## 2016-06-24 ENCOUNTER — Encounter: Payer: Self-pay | Admitting: Family Medicine

## 2016-06-24 ENCOUNTER — Ambulatory Visit (INDEPENDENT_AMBULATORY_CARE_PROVIDER_SITE_OTHER): Payer: BLUE CROSS/BLUE SHIELD | Admitting: Family Medicine

## 2016-06-24 VITALS — BP 91/53 | HR 56 | Ht 67.0 in | Wt 124.0 lb

## 2016-06-24 DIAGNOSIS — R635 Abnormal weight gain: Secondary | ICD-10-CM | POA: Diagnosis not present

## 2016-06-24 DIAGNOSIS — G5603 Carpal tunnel syndrome, bilateral upper limbs: Secondary | ICD-10-CM

## 2016-06-24 DIAGNOSIS — E039 Hypothyroidism, unspecified: Secondary | ICD-10-CM

## 2016-06-24 DIAGNOSIS — Z79890 Hormone replacement therapy (postmenopausal): Secondary | ICD-10-CM | POA: Diagnosis not present

## 2016-06-24 NOTE — Patient Instructions (Signed)
Call your drug company ans see if there is something less expensive than the Paradis for her hormone replacement therapy that has both and estrogen and a progesterone in it.

## 2016-06-24 NOTE — Progress Notes (Signed)
Subjective:    CC: Hormone replacement therapy.  HPI:  Started Activella about 6 weeks ago for hormone replacement therapy after we discussed the risks and benefits of treatment. She is on a dual agents and she still has a uterus.Unfortunately, she was unable to afford the co-pay. It was about $20 that she did not pick up the medication.  Hypothyroid - she is on levo 166mcg.  Last lab level in July looks great! She has been more hungry than usual.   Lab Results  Component Value Date   TSH 1.31 04/15/2016   TSH 1.31 04/15/2016   She is concerned about weight gain.  She has a normal BMI.  She eats very health overall.  She doesn't exercise bc of her knee pain.    She also wanted to discuss her frequent urination. She saw Dr. Ruthann Cancer, her nephrologist. He thinks that she may actually have some scar tissue on her kidneys from taking lithium for years. He is doing some additional blood work which she just had drawn about 2 days ago. She has not heard back on the results. She has noticed some swelling particularly around the medial portion of her knees but not in the ankle or foot area.  She's also been having some bilateral hand pain. She says she feels like her whole hand hurts is not just specific joints. She doesn't feel like it's an actual cramping sensation. Sometimes she feels like they're electric shocks going through her hands. She actually had an EMG study with Dr. Domingo Cocking sometime ago and was told that she might have some early carpal tunnel syndrome. Positive Phalen's test today. We'll fit her for bilateral cockup splints to wear at night.   Past medical history, Surgical history, Family history not pertinant except as noted below, Social history, Allergies, and medications have been entered into the medical record, reviewed, and corrections made.   Review of Systems: No fevers, chills, night sweats, weight loss, chest pain, or shortness of breath.   Objective:    General: Well  Developed, well nourished, and in no acute distress.  Neuro: Alert and oriented x3, extra-ocular muscles intact, sensation grossly intact.  HEENT: Normocephalic, atraumatic  Skin: Warm and dry, no rashes. Cardiac: Regular rate and rhythm, no murmurs rubs or gallops, no lower extremity edema.  Respiratory: Clear to auscultation bilaterally. Not using accessory muscles, speaking in full sentences. MSK : Wrist with normal range of motion and strength is 5 out of 5. Radial pulses 2+ bilaterally. Negative Tennille sign but positive Phalen sign. Calls numbness and tingling in her fingertips.   Impression and Recommendations:   HRT - Did encourage her to call her insurance completed see what might be better covered on her insurance plan for a lower co-pay.  Hypothyroid - we just checked her thyroid 8 weeks ago and it looked great so really don't think this is the cause of her recent increased appetite.  Abnormal weight gain - she feels like her increased appetite is extreme. Unclear what may be causing this at this time. She has no history of diabetes. Thyroid was normal 8 weeks ago.  Bilateral carpal tunnel syndrome - Positive Phalen's test today. We'll fit her for bilateral cockup splints to wear at night.

## 2016-06-29 DIAGNOSIS — R350 Frequency of micturition: Secondary | ICD-10-CM | POA: Diagnosis not present

## 2016-06-29 DIAGNOSIS — G250 Essential tremor: Secondary | ICD-10-CM | POA: Diagnosis not present

## 2016-06-29 DIAGNOSIS — G43711 Chronic migraine without aura, intractable, with status migrainosus: Secondary | ICD-10-CM | POA: Diagnosis not present

## 2016-06-30 ENCOUNTER — Encounter: Payer: Self-pay | Admitting: Physical Therapy

## 2016-06-30 ENCOUNTER — Ambulatory Visit (INDEPENDENT_AMBULATORY_CARE_PROVIDER_SITE_OTHER): Payer: BLUE CROSS/BLUE SHIELD | Admitting: Physical Therapy

## 2016-06-30 DIAGNOSIS — M79671 Pain in right foot: Secondary | ICD-10-CM | POA: Diagnosis not present

## 2016-06-30 DIAGNOSIS — M6281 Muscle weakness (generalized): Secondary | ICD-10-CM

## 2016-06-30 DIAGNOSIS — M79672 Pain in left foot: Secondary | ICD-10-CM

## 2016-06-30 DIAGNOSIS — R2689 Other abnormalities of gait and mobility: Secondary | ICD-10-CM | POA: Diagnosis not present

## 2016-06-30 DIAGNOSIS — M25671 Stiffness of right ankle, not elsewhere classified: Secondary | ICD-10-CM | POA: Diagnosis not present

## 2016-06-30 DIAGNOSIS — M25672 Stiffness of left ankle, not elsewhere classified: Secondary | ICD-10-CM

## 2016-06-30 NOTE — Therapy (Signed)
[facility] [place], [place], [address] Phone: [phone]   Fax:  [phone]  Physical Therapy Evaluation  Patient Details  Name: Mandy Shaw MRN: [id number] Date of Birth: 05-Jun-1961 Referring Provider: Dr Sherrin Daisy  Encounter Date: 06/30/2016      PT End of Session - 06/30/16 1020    Visit Number 1   Number of Visits 8   Date for PT Re-Evaluation 07/28/16   PT Start Time 1020   PT Stop Time 1113   PT Time Calculation (min) 53 min   Activity Tolerance Patient limited by pain      Past Medical History:  Diagnosis Date  . Arthritis   . Bipolar 1 disorder ([place])   . COPD (chronic obstructive pulmonary disease) ([place])   . GERD (gastroesophageal reflux disease)   . Insomnia   . Myasthenia gravis ([place])   . 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 Personality disorder   . Thyroid disease     Past Surgical History:  Procedure Laterality Date  . CARDIAC SURGERY     flap on heart removed  . CESAREAN SECTION    . CHOLECYSTECTOMY    . CONDYLOMA EXCISION/FULGURATION    . EYE SURGERY    . INCONTINENCE SURGERY    . INDUCED ABORTION    . LAPAROTOMY     exploratory for pain  . LEEP    . Left hip surgery  01/09/14   Dr. Arnette Norris  . right hip surgery  09/26/2013   Dr. Arnette Norris  . SHOULDER SURGERY Right 2015   Dr. Margretta Sidle  . THYROIDECTOMY      There were no vitals filed for this visit.       Subjective Assessment - 06/30/16 1025    Subjective Pt noticed bilat foot pain about 3 months ago, started as slightly uncomfortable and intermittent.  Over time the pain increased and became constant. The pain is now waking her at night. Recently signed up with the [facility] and will work with a trainer   Pertinent History fibromyalgia, neck and back pain, knee issues, depression - lost a lot of weight, bilat hip surgery - pins in bilat femurs - 3 yrs ago   How long can you sit  comfortably? has to keep moving her feet   How long can you walk comfortably? household, difficulty to grocery shop   Diagnostic tests none   Patient Stated Goals get rid of pain   Currently in Pain? Yes   Pain Score 5    Pain Location Foot   Pain Orientation Left   Pain Descriptors / Indicators Restless   Pain Type Acute pain   Pain Onset More than a month ago   Pain Frequency Constant   Aggravating Factors  everything   Pain Relieving Factors gentle movement    Multiple Pain Sites Yes   Pain Score 7   Pain Location Foot   Pain Orientation Right   Pain Descriptors / Indicators Restless   Pain Type Acute pain   Aggravating Factors  everything   Pain Relieving Factors gentle movement             [facility] PT Assessment - 06/30/16 0001      Assessment   Medical Diagnosis bilat tarsal tunnel syndrome   Referring Provider Dr Sherrin Daisy   Onset Date/Surgical Date 03/30/16   Next MD Visit sees her regularly to trim toenails.    Prior  Therapy not for this     Precautions   Precautions None     Balance Screen   Has the patient fallen in the past 6 months No  has near misses, will feel dizzy-ish and body not work   Has the patient had a decrease in activity level because of a fear of falling?  No   Is the patient reluctant to leave their home because of a fear of falling?  No     Home Ec[employer] residence   Living Arrangements Spouse/significant other   [place] to enter  3 into the house, is careful   Home Layout One level     Prior Function   Level of Independence Independent   Vocation On disability  bipolar, personality d/o   Leisure read, watch TV, had a new puppy - training      Observation/Other Assessments   Focus on Therapeutic Outcomes (FOTO)  56% limited     Functional Tests   Functional tests Squat;Single leg stance     Squat   Comments NA pt said her knees won't le her do this.      Single Leg Stance    Comments NA d/t balance     Posture/Postural Control   Posture/Postural Control Postural limitations   Postural Limitations Decreased lumbar lordosis;Rounded Shoulders;Forward head  pes planus Lt  very thin framed.      ROM / Strength   AROM / PROM / Strength AROM;PROM;Strength     AROM   AROM Assessment Site Ankle   Right/Left Ankle Left;Right   Right Ankle Dorsiflexion 1   Right Ankle Plantar Flexion 54   Right Ankle Inversion 32   Right Ankle Eversion 16   Left Ankle Dorsiflexion 5   Left Ankle Plantar Flexion 55   Left Ankle Inversion 40   Left Ankle Eversion 37     PROM   PROM Assessment Site Ankle   Right/Left Ankle Left;Right   Right Ankle Dorsiflexion 4   Right Ankle Eversion 20   Left Ankle Dorsiflexion 8     Strength   Overall Strength Comments great toe extension bilat 90    Strength Assessment Site Hip;Knee;Ankle   Right/Left Hip Left;Right   Right Hip Flexion 5/5   Right Hip Extension 4+/5   Right Hip ABduction 4-/5   Left Hip Flexion 5/5   Left Hip Extension 4+/5   Left Hip ABduction 4+/5   Right/Left Knee --  bilat quads 4/5, hamstrings 5-/5   Right/Left Ankle --  bilat 3+/5 with pain in all directions     Palpation   Palpation comment hypersensitive in bilat lower legs - pt reports it is her fibromyalgia. tender also in the distal arch of foot and on top of foot, Good mobility of the tarsal bones.      Ambulation/Gait   Gait Pattern Decreased stance time - right;Decreased step length - left;Decreased step length - right;Antalgic                   [facility] Adult PT Treatment/Exercise - 06/30/16 0001      Exercises   Exercises Ankle     Modalities   Modalities Moist Heat     Moist Heat Therapy   Number Minutes Moist Heat 15 Minutes   Moist Heat Location --  bilat feet     Ankle Exercises: Stretches   Gastroc Stretch 1 rep;30 seconds  each side     Ankle Exercises:  Seated   ABC's 1 rep  pt able to get to F    Other Seated  Ankle Exercises eversion bilat with yellow band                 PT Education - 06/30/16 1420    Education provided Yes   Education Details HEP   Person(s) Educated Patient   Methods Explanation;Demonstration;Handout   Comprehension Returned demonstration;Verbalized understanding             PT Long Term Goals - 06/30/16 1428      PT LONG TERM GOAL #1   Title I with advanced HEP to include transition to the [employer] ( 07/28/16)    Time 4   Period Weeks   Status New     PT LONG TERM GOAL #2   Title report overall pain reduction in her feet =/> 50% ( 07/28/16)    Time 4   Period Weeks   Status New     PT LONG TERM GOAL #3   Title increase bilat ankle dorsiflexion =/> 12 degrees to assist with gait ( 07/28/16)    Time 4   Period Weeks   Status New     PT LONG TERM GOAL #4   Title improve FOTO =/< 41% limited ( 07/28/16)    Time 4   Period Weeks   Status New     PT LONG TERM GOAL #5   Title improve bilat hip strength =/> 4+/5 ( 07/28/16)    Time 4   Period Weeks   Status New               Plan - 06/30/16 1421    Clinical Impression Statement 55 yo female with multiple medical and orthopedic issues presents with 3 month h/o bilat tarsal tunnel syndrome.  She also has a new diagnosis of myesthenia gravis and has not started on the medication yet.  Along with this she has fibromyagia that affects the muscles. She has very limited ankle dorsiflexion and LE weakness,  She has a constant movement in her feet and hands, almost tremor like. She self limits herself with some functional activities and this may be related to psychological diagnosis. We will place her on a 3-4 week trial of PT and see how she does towards her goals.  Mandy Shaw presents wearing flat sandles with no support, recommend she wear sneakers with better support.    Rehab Potential Fair   PT Frequency 2x / week   PT Duration 4 weeks   PT Treatment/Interventions Moist Heat;Ultrasound;Therapeutic  exercise;Dry needling;Taping;Vasopneumatic Device;Manual techniques;Cryotherapy;Gait training;Neuromuscular re-education;Electrical Stimulation;Patient/family education;Passive range of motion   PT Next Visit Plan progress weightbearing dorsiflexion activites, LE strengthening/hips   Consulted and Agree with Plan of Care Patient      Patient will benefit from skilled therapeutic intervention in order to improve the following deficits and impairments:  Postural dysfunction, Decreased strength, Pain, Increased muscle spasms, Difficulty walking, Decreased range of motion  Visit Diagnosis: Pain in both feet - Plan: PT plan of care cert/re-cert  Muscle weakness (generalized) - Plan: PT plan of care cert/re-cert  Other abnormalities of gait and mobility - Plan: PT plan of care cert/re-cert  Joint stiffness of both ankles - Plan: PT plan of care cert/re-cert     Problem List Patient Active Problem List   Diagnosis Date Noted  . Trigger finger, left 04/15/2016  . Underweight 10/22/2015  . Malnutrition, calorie ([place]) 10/22/2015  . Pain in lower limb 09/17/2015  .  Microalbuminuria 08/22/2015  . Tobacco abuse 09/17/2014  . Insomnia 04/09/2014  . 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 Hypothyroidism 07/05/2013  . Herpes 09/05/2012  . Hyperlipidemia 06/05/2010  . Bipolar disorder ([place]) 08/22/2009  . BORDERLINE PERSONALITY DISORDER 08/22/2009  . PTSD 08/22/2009  . Migraine headache 08/22/2009  . KNEE PAIN 08/22/2009  . COPD (chronic obstructive pulmonary disease) with chronic bronchitis ([place]) 07/23/2009  . RHEUMATOID ARTHRITIS 07/23/2009    Jeral Pinch PT  06/30/2016, 2:33 PM  [facility] [place], [place], [address] Phone: [phone]   Fax:  [phone]  Name: Mandy Shaw MRN: [id number] Date of Birth: 19-Oct-1960

## 2016-06-30 NOTE — Patient Instructions (Addendum)
Start wearing tennis shoes to give more support to the feet.     [place] [id number]  Ankle Alphabet    Using left ankle and foot only, trace the letters of the alphabet. Perform A to Z. Repeat on the other leg Repeat __1_ times per set. Do __1__ sets per session. Do __1_ sessions per day.  Gastroc Stretch    Stand with right foot back, leg straight, forward leg bent. Keeping heel on floor, turned slightly out, lean into wall until stretch is felt in calf. Hold __30__ seconds. Repeat __1-2__ times per set. Do __1__ sets per session. Do ___1_ sessions per day. Repeat on the other leg. To protect low back, keep chest lifted and belly tight.   Eversion: Resisted    With both feet in tubing loop, sitting position, feet hip distance apart. Turn feet out. Repeat __5-10__ times per set. Do _2___ sets per session. Do __1__ sessions per day.  Copyright  VHI. All rights reserved.

## 2016-07-05 ENCOUNTER — Encounter: Payer: Self-pay | Admitting: Physical Therapy

## 2016-07-05 ENCOUNTER — Telehealth (HOSPITAL_COMMUNITY): Payer: Self-pay | Admitting: Psychiatry

## 2016-07-05 NOTE — Telephone Encounter (Signed)
Pt needs refill on trazadone

## 2016-07-06 MED ORDER — TRAZODONE HCL 100 MG PO TABS: 150.0000 mg | ORAL_TABLET | Freq: Every day | ORAL | 0 refills | Status: DC

## 2016-07-06 NOTE — Telephone Encounter (Signed)
Medication refill- pt phone office requesting a refill for Trazodone. Per Dr. De Nurse, refill is authorized for Trazodone 100mg , #45. Rx was sent to [facility]. lvm informing pt of refill status. Pt is schedule for a f/u appt on 11/14.

## 2016-07-07 DIAGNOSIS — K5900 Constipation, unspecified: Secondary | ICD-10-CM | POA: Diagnosis not present

## 2016-07-07 DIAGNOSIS — N816 Rectocele: Secondary | ICD-10-CM | POA: Diagnosis not present

## 2016-07-08 ENCOUNTER — Encounter: Payer: Self-pay | Admitting: Physical Therapy

## 2016-07-09 ENCOUNTER — Ambulatory Visit (INDEPENDENT_AMBULATORY_CARE_PROVIDER_SITE_OTHER): Payer: BLUE CROSS/BLUE SHIELD | Admitting: Physical Therapy

## 2016-07-09 DIAGNOSIS — M79671 Pain in right foot: Secondary | ICD-10-CM

## 2016-07-09 DIAGNOSIS — M6281 Muscle weakness (generalized): Secondary | ICD-10-CM | POA: Diagnosis not present

## 2016-07-09 DIAGNOSIS — R2689 Other abnormalities of gait and mobility: Secondary | ICD-10-CM

## 2016-07-09 DIAGNOSIS — M25671 Stiffness of right ankle, not elsewhere classified: Secondary | ICD-10-CM

## 2016-07-09 DIAGNOSIS — M79672 Pain in left foot: Secondary | ICD-10-CM

## 2016-07-09 DIAGNOSIS — M25672 Stiffness of left ankle, not elsewhere classified: Secondary | ICD-10-CM

## 2016-07-09 NOTE — Patient Instructions (Signed)
Tandem Stance    Right foot in front of left, heel touching toe both feet "straight ahead". Stand on Foot Triangle of Support with both feet. Balance in this position __15-30_ seconds. Do with left foot in front of right.  2-3 reps  * Stand next to counter.     Stretch calves with heels off of a step.  (one foot at a time).   15-30 seconds, 2-3 times.

## 2016-07-09 NOTE — Therapy (Signed)
[facility] [place], [place], [address] Phone: [phone]   Fax:  [phone]  Physical Therapy Treatment  Patient Details  Name: Mandy Shaw MRN: [id number] Date of Birth: 1960-11-26 Referring Provider: Dr Sherrin Daisy  Encounter Date: 07/09/2016      PT End of Session - 07/09/16 1447    Visit Number 2   Number of Visits 8   Date for PT Re-Evaluation 07/28/16   PT Start Time 0233   PT Stop Time 1445   PT Time Calculation (min) 42 min      Past Medical History:  Diagnosis Date  . Arthritis   . Bipolar 1 disorder ([place])   . COPD (chronic obstructive pulmonary disease) ([place])   . GERD (gastroesophageal reflux disease)   . Insomnia   . Myasthenia gravis ([place])   . 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 Personality disorder   . Thyroid disease     Past Surgical History:  Procedure Laterality Date  . CARDIAC SURGERY     flap on heart removed  . CESAREAN SECTION    . CHOLECYSTECTOMY    . CONDYLOMA EXCISION/FULGURATION    . EYE SURGERY    . INCONTINENCE SURGERY    . INDUCED ABORTION    . LAPAROTOMY     exploratory for pain  . LEEP    . Left hip surgery  01/09/14   Dr. Arnette Norris  . right hip surgery  09/26/2013   Dr. Arnette Norris  . SHOULDER SURGERY Right 2015   Dr. Margretta Sidle  . THYROIDECTOMY      There were no vitals filed for this visit.      Subjective Assessment - 07/09/16 1406    Subjective Pt bought tennis shoes and she is reporting her joints are hurting a lot more.     Pertinent History fibromyalgia, neck and back pain, knee issues, depression - lost a lot of weight, bilat hip surgery - pins in bilat femurs - 3 yrs ago   Currently in Pain? Yes   Pain Score 8    Pain Location Foot   Pain Orientation Left;Right   Pain Descriptors / Indicators Constant;Nagging   Aggravating Factors  new shoes    Pain Relieving Factors ???            [facility] PT  Assessment - 07/09/16 0001      Assessment   Medical Diagnosis bilat tarsal tunnel syndrome   Onset Date/Surgical Date 03/30/16     AROM   Right/Left Ankle Right;Left   Right Ankle Dorsiflexion 2   Right Ankle Inversion 38   Right Ankle Eversion 12   Left Ankle Dorsiflexion 5     PROM   Right Ankle Dorsiflexion 8   Right Ankle Eversion 20  with pain   Left Ankle Dorsiflexion 10          [facility] Adult PT Treatment/Exercise - 07/09/16 0001      Ankle Exercises: Stretches   Gastroc Stretch 3 reps;30 seconds  each side     Ankle Exercises: Seated   ABC's --   Ankle Circles/Pumps Right;Left;5 reps   Other Seated Ankle Exercises eversion bilat with yellow band 5 reps, 3 sets   Other Seated Ankle Exercises PF/DF and inv/eversion on 1/2 foam roll x 5 reps, each foot.      Ankle Exercises: Standing   Other Standing Ankle Exercises heel taps on 3" step x 5 reps x  2 sets each side.    Other Standing Ankle Exercises semi-tandem stance with occasional UE support x 30 sec x 2 reps each side (challenging)            PT Long Term Goals - 06/30/16 1428      PT LONG TERM GOAL #1   Title I with advanced HEP to include transition to the [employer] ( 07/28/16)    Time 4   Period Weeks   Status New     PT LONG TERM GOAL #2   Title report overall pain reduction in her feet =/> 50% ( 07/28/16)    Time 4   Period Weeks   Status New     PT LONG TERM GOAL #3   Title increase bilat ankle dorsiflexion =/> 12 degrees to assist with gait ( 07/28/16)    Time 4   Period Weeks   Status New     PT LONG TERM GOAL #4   Title improve FOTO =/< 41% limited ( 07/28/16)    Time 4   Period Weeks   Status New     PT LONG TERM GOAL #5   Title improve bilat hip strength =/> 4+/5 ( 07/28/16)    Time 4   Period Weeks   Status New               Plan - 07/09/16 1631    Clinical Impression Statement Pt demonstrated slight, but minimal increase in ankle ROM.  She tolerates 5 reps of exercise at a  time and does complain of some cramping afterward.  She has not tolerated wearing supportive sneakers; complains of increased pain everywhere.  No new goals met yet as this was only second visit.     Rehab Potential Fair   PT Frequency 2x / week   PT Duration 4 weeks   PT Treatment/Interventions Moist Heat;Ultrasound;Therapeutic exercise;Dry needling;Taping;Vasopneumatic Device;Manual techniques;Cryotherapy;Gait training;Neuromuscular re-education;Electrical Stimulation;Patient/family education;Passive range of motion   PT Next Visit Plan progress weightbearing dorsiflexion activites, LE strengthening/hips.    Consulted and Agree with Plan of Care Patient      Patient will benefit from skilled therapeutic intervention in order to improve the following deficits and impairments:  Postural dysfunction, Decreased strength, Pain, Increased muscle spasms, Difficulty walking, Decreased range of motion  Visit Diagnosis: Pain in both feet  Muscle weakness (generalized)  Other abnormalities of gait and mobility  Joint stiffness of both ankles     Problem List Patient Active Problem List   Diagnosis Date Noted  . Trigger finger, left 04/15/2016  . Underweight 10/22/2015  . Malnutrition, calorie ([place]) 10/22/2015  . Pain in lower limb 09/17/2015  . Microalbuminuria 08/22/2015  . Tobacco abuse 09/17/2014  . Insomnia 04/09/2014  . 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 Hypothyroidism 07/05/2013  . Herpes 09/05/2012  . Hyperlipidemia 06/05/2010  . Bipolar disorder ([place]) 08/22/2009  . BORDERLINE PERSONALITY DISORDER 08/22/2009  . PTSD 08/22/2009  . Migraine headache 08/22/2009  . KNEE PAIN 08/22/2009  . COPD (chronic obstructive pulmonary disease) with chronic bronchitis ([place]) 07/23/2009  . RHEUMATOID ARTHRITIS 07/23/2009   Kerin Perna, PTA 07/09/16 4:37 PM  [facility] [place], [place], [address] Phone: [phone]   Fax:  [phone]  Name: Mandy Shaw MRN: [id number] Date of Birth: 1961-01-03

## 2016-07-10 ENCOUNTER — Other Ambulatory Visit (HOSPITAL_COMMUNITY): Payer: Self-pay | Admitting: Psychiatry

## 2016-07-10 ENCOUNTER — Other Ambulatory Visit: Payer: Self-pay | Admitting: Family Medicine

## 2016-07-13 ENCOUNTER — Ambulatory Visit (INDEPENDENT_AMBULATORY_CARE_PROVIDER_SITE_OTHER): Payer: BLUE CROSS/BLUE SHIELD | Admitting: Physical Therapy

## 2016-07-13 ENCOUNTER — Emergency Department (INDEPENDENT_AMBULATORY_CARE_PROVIDER_SITE_OTHER)
Admission: EM | Admit: 2016-07-13 | Discharge: 2016-07-13 | Disposition: A | Discharge status: Home / Self Care | Payer: BLUE CROSS/BLUE SHIELD | Source: Home / Self Care

## 2016-07-13 ENCOUNTER — Encounter: Payer: Self-pay | Admitting: Physical Therapy

## 2016-07-13 ENCOUNTER — Encounter: Payer: Self-pay | Admitting: *Deleted

## 2016-07-13 DIAGNOSIS — M79671 Pain in right foot: Secondary | ICD-10-CM | POA: Diagnosis not present

## 2016-07-13 DIAGNOSIS — S60221A Contusion of right hand, initial encounter: Secondary | ICD-10-CM

## 2016-07-13 DIAGNOSIS — M79672 Pain in left foot: Secondary | ICD-10-CM

## 2016-07-13 DIAGNOSIS — R2689 Other abnormalities of gait and mobility: Secondary | ICD-10-CM

## 2016-07-13 DIAGNOSIS — M6281 Muscle weakness (generalized): Secondary | ICD-10-CM

## 2016-07-13 DIAGNOSIS — M792 Neuralgia and neuritis, unspecified: Secondary | ICD-10-CM | POA: Diagnosis not present

## 2016-07-13 DIAGNOSIS — S60222A Contusion of left hand, initial encounter: Secondary | ICD-10-CM

## 2016-07-13 DIAGNOSIS — M25671 Stiffness of right ankle, not elsewhere classified: Secondary | ICD-10-CM | POA: Diagnosis not present

## 2016-07-13 DIAGNOSIS — M25672 Stiffness of left ankle, not elsewhere classified: Secondary | ICD-10-CM

## 2016-07-13 NOTE — ED Triage Notes (Signed)
Patient c/o left shooting hand pain x last night that started while holding a 3lb puppy. She has weakness and pain when attempting to pick up items. She has a h/o deep laceration to that hand that left her with nerve damage to her left index finger. More recently received another laceration to her left palm.

## 2016-07-13 NOTE — Therapy (Signed)
[place], [place], [address] Phone: [phone]   Fax:  [phone]  Physical Therapy Treatment  Patient Details  Name: Mandy Shaw MRN: [id number] Date of Birth: [birth year] Referring Provider: Dr Sherrin Daisy  Encounter Date: 07/13/2016      PT End of Session - 07/13/16 1147    Visit Number 3   Number of Visits 8   Date for PT Re-Evaluation 07/28/16   PT Start Time 1147   PT Stop Time 1247   PT Time Calculation (min) 60 min   Activity Tolerance Patient limited by pain      Past Medical History:  Diagnosis Date  . Arthritis   . Bipolar 1 disorder ([place])   . COPD (chronic obstructive pulmonary disease) ([place])   . GERD (gastroesophageal reflux disease)   . Insomnia   . Myasthenia gravis ([place])   . 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 Personality disorder   . Thyroid disease     Past Surgical History:  Procedure Laterality Date  . CARDIAC SURGERY     flap on heart removed  . CESAREAN SECTION    . CHOLECYSTECTOMY    . CONDYLOMA EXCISION/FULGURATION    . EYE SURGERY    . INCONTINENCE SURGERY    . INDUCED ABORTION    . LAPAROTOMY     exploratory for pain  . LEEP    . Left hip surgery  01/09/14   Dr. Arnette Norris  . right hip surgery  09/26/2013   Dr. Arnette Norris  . SHOULDER SURGERY Right 2015   Dr. Margretta Sidle  . THYROIDECTOMY      There were no vitals filed for this visit.      Subjective Assessment - 07/13/16 1147    Subjective Mandy Shaw reports they rescued a mom and baby dog over the weekend, gave them baths and was leaning over  the tub alot and now really sore.  She reports that she is having increased calf spasms the night or two after treatments.    Currently in Pain? Yes  pt also reports having "high alert" back and neck pain   Pain Score 7    Pain Location Foot   Pain Orientation Left;Right   Pain Descriptors / Indicators Constant   Pain  Type Acute pain   Pain Onset More than a month ago   Pain Frequency Constant                         [facility] Adult PT Treatment/Exercise - 07/13/16 0001      Modalities   Modalities Moist Heat     Moist Heat Therapy   Number Minutes Moist Heat 15 Minutes   Moist Heat Location --  bilat feet and calves     Ankle Exercises: Aerobic   Stationary Bike L1x3'  stopped d/knee pain     Ankle Exercises: Supine   Other Supine Ankle Exercises SLR, hip abd, red band, hooklying, bridging bilat to fatigue     Ankle Exercises: Seated   Heel Raises 20 reps  on prostretch   Toe Raise 20 reps  on prostretch   BAPS Sitting;Level 3;10 reps  CW/CCW     Ankle Exercises: Standing   Other Standing Ankle Exercises heel taps on 8" step x 5 reps x 2 sets each side.      Ankle Exercises: Sidelying   Other Sidelying Ankle Exercises hip adduction  to fatigue                     PT Long Term Goals - 07/13/16 1246      PT LONG TERM GOAL #1   Title I with advanced HEP to include transition to the [facility] ( 07/28/16)    Time 4   Period Weeks   Status On-going     PT LONG TERM GOAL #2   Title report overall pain reduction in her feet =/> 50% ( 07/28/16)    Time 4   Period Weeks   Status On-going     PT LONG TERM GOAL #3   Title increase bilat ankle dorsiflexion =/> 12 degrees to assist with gait ( 07/28/16)    Time 4   Period Weeks     PT LONG TERM GOAL #4   Title improve FOTO =/< 41% limited ( 07/28/16)    Time 4   Period Weeks   Status On-going     PT LONG TERM GOAL #5   Title improve bilat hip strength =/> 4+/5 ( 07/28/16)    Time 4   Period Weeks   Status On-going               Plan - 07/13/16 1233    Clinical Impression Statement Mandy Shaw continues to have pain in both her feet along with multiple other body parts. She has experienced a lot of gastroc cramping the night of treatment and the next couple of days.  If this continues we could perform more  gravity resistance ther ex and less weight bearing exericse to ease  her into the program.  She also presents with some alignment issues in her Rt LE, not sure if it is from hip restrictions or bny changes in her knee.    Rehab Potential Fair   PT Frequency 2x / week   PT Duration 4 weeks   PT Treatment/Interventions Moist Heat;Ultrasound;Therapeutic exercise;Dry needling;Taping;Vasopneumatic Device;Manual techniques;Cryotherapy;Gait training;Neuromuscular re-education;Electrical Stimulation;Patient/family education;Passive range of motion   PT Next Visit Plan Rt hip stretches for rotation, progress LE strengthening.  Assess pt response to tx, if still having a lot of night spasms ease off weightbearing exercise.    Consulted and Agree with Plan of Care Patient      Patient will benefit from skilled therapeutic intervention in order to improve the following deficits and impairments:  Postural dysfunction, Decreased strength, Pain, Increased muscle spasms, Difficulty walking, Decreased range of motion  Visit Diagnosis: Pain in both feet  Muscle weakness (generalized)  Other abnormalities of gait and mobility  Joint stiffness of both ankles     Problem List Patient Active Problem List   Diagnosis Date Noted  . Trigger finger, left 04/15/2016  . Underweight 10/22/2015  . Malnutrition, calorie ([place]) 10/22/2015  . Pain in lower limb 09/17/2015  . Microalbuminuria 08/22/2015  . Tobacco abuse 09/17/2014  . Insomnia 04/09/2014  . 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 Hypothyroidism 07/05/2013  . Herpes 09/05/2012  . Hyperlipidemia 06/05/2010  . Bipolar disorder ([place]) 08/22/2009  . BORDERLINE PERSONALITY DISORDER 08/22/2009  . PTSD 08/22/2009  . Migraine headache 08/22/2009  . KNEE PAIN 08/22/2009  . COPD (chronic obstructive pulmonary disease) with chronic bronchitis ([place]) 07/23/2009  . RHEUMATOID ARTHRITIS 07/23/2009    Jeral Pinch  PT 07/13/2016, 12:51 PM  [facility] [place], [place], [address] Phone: [phone]  Fax:  [phone]  Name: Mandy Shaw MRN: [id number] Date of Birth: [birth year]

## 2016-07-13 NOTE — ED Provider Notes (Signed)
[facility]    CSN: [id number] Arrival date & time: 07/13/16  1254     History   Chief Complaint Chief Complaint  Patient presents with  . Hand Pain    HPI Mandy Shaw is a 55 y.o. female.   HPI Patient c/o left shooting hand pain x last night that started while holding a 3lb puppy. She has The feeling of weakness and moderate, "shooting" pain when attempting to pick up items with left hand.  She states that, In the remote past, she had a h/o deep laceration to that hand that left her with "nerve damage to her left index finger."  Yesterday accidentally sustained superficial tiny superficial laceration palmar aspect left hand. Has not tried any particular treatment for this other than resting her left hand. Past Medical History:  Diagnosis Date  . Arthritis   . Bipolar 1 disorder ([place])   . COPD (chronic obstructive pulmonary disease) ([place])   . GERD (gastroesophageal reflux disease)   . Insomnia   . Myasthenia gravis ([place])   . 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 Personality disorder   . Thyroid disease     Patient Active Problem List   Diagnosis Date Noted  . Trigger finger, left 04/15/2016  . Underweight 10/22/2015  . Malnutrition, calorie ([place]) 10/22/2015  . Pain in lower limb 09/17/2015  . Microalbuminuria 08/22/2015  . Tobacco abuse 09/17/2014  . Insomnia 04/09/2014  . 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 Hypothyroidism 07/05/2013  . Herpes 09/05/2012  . Hyperlipidemia 06/05/2010  . Bipolar disorder ([place]) 08/22/2009  . BORDERLINE PERSONALITY DISORDER 08/22/2009  . PTSD 08/22/2009  . Migraine headache 08/22/2009  . KNEE PAIN 08/22/2009  . COPD (chronic obstructive pulmonary disease) with chronic bronchitis ([place]) 07/23/2009  . RHEUMATOID ARTHRITIS 07/23/2009    Past Surgical History:  Procedure Laterality Date  . CARDIAC SURGERY     flap on heart removed  . CESAREAN  SECTION    . CHOLECYSTECTOMY    . CONDYLOMA EXCISION/FULGURATION    . EYE SURGERY    . INCONTINENCE SURGERY    . INDUCED ABORTION    . LAPAROTOMY     exploratory for pain  . LEEP    . Left hip surgery  01/09/14   Dr. Arnette Norris  . right hip surgery  09/26/2013   Dr. Arnette Norris  . SHOULDER SURGERY Right 2015   Dr. Margretta Sidle  . THYROIDECTOMY      OB History    Gravida Para Term Preterm AB Living   2 0   0 1 1   SAB TAB Ectopic Multiple Live Births     1             Home Medications    Prior to Admission medications   Medication Sig Start Date End Date Taking? Authorizing Provider  ARIPiprazole (ABILIFY) 10 MG tablet Take 1 tablet (10 mg total) by mouth daily. 07/14/16 07/14/17  Merian Capron, MD  benazepril-hydrochlorthiazide (LOTENSIN HCT) 10-12.5 MG tablet Take 1 tablet by mouth daily.    Historical Provider, MD  BIOTIN PO Take by mouth.    Historical Provider, MD  Calcium Carb-Cholecalciferol (CALCIUM 600 + D PO) Take by mouth.    Historical Provider, MD  Calcium Carb-Cholecalciferol 600-500 MG-UNIT CAPS Take by mouth.    Historical Provider, MD  DOCUSATE CALCIUM PO Take by mouth.    Historical Provider, MD  DULoxetine (CYMBALTA) 60 MG capsule Take 2 capsules (120 mg total) by mouth daily. 07/14/16   Merian Capron, MD  famotidine (PEPCID) 20 MG tablet take 1 tablet by mouth once daily 02/23/16   Hali Marry, MD  hydrochlorothiazide (HYDRODIURIL) 12.5 MG tablet Take by mouth. 06/24/16 06/24/17  Historical Provider, MD  lamoTRIgine (LAMICTAL) 200 MG tablet Take 1 tablet (200 mg total) by mouth daily. One per day. 07/14/16   Merian Capron, MD  levothyroxine (SYNTHROID, LEVOTHROID) 125 MCG tablet take 1 tablet by mouth daily 05/20/16   Hali Marry, MD  levothyroxine (SYNTHROID, LEVOTHROID) 125 MCG tablet Take by mouth. 06/24/16   Historical Provider, MD  lubiprostone (AMITIZA) 24 MCG capsule Take by mouth.    Historical Provider, MD  mirtazapine (REMERON) 7.5 MG  tablet Take 1 tablet (7.5 mg total) by mouth at bedtime. One at night 07/14/16   Merian Capron, MD  primidone (MYSOLINE) 50 MG tablet  04/30/16   Historical Provider, MD  simvastatin (ZOCOR) 20 MG tablet take 1 tablet by mouth ONCE DAILY; PLEASE SEE PROVIDER FOR LIPID PANEL 06/10/16   Hali Marry, MD  SUMAtriptan (IMITREX) 100 MG tablet TAKE 1 TABLET BY MOUTH AT ONSET OF HEADACHE. MAY REPEAT 2 HOURS L...  (REFER TO PRESCRIPTION NOTES). 01/28/16   Historical Provider, MD  tizanidine (ZANAFLEX) 2 MG capsule Take 4 mg by mouth 2 (two) times daily as needed for muscle spasms.    Historical Provider, MD  Topiramate ER (TROKENDI XR) 200 MG CP24 Take 200 mg by mouth daily.    Historical Provider, MD  traZODone (DESYREL) 100 MG tablet Take 1.5 tablets (150 mg total) by mouth at bedtime. Please discontinue all previous prescriptions. 07/14/16 07/14/17  Merian Capron, MD  Turmeric Curcumin 500 MG CAPS Take by mouth.    Historical Provider, MD  Vitamins/Minerals TABS Take 1 tablet by mouth.    Historical Provider, MD    Family History Family History  Problem Relation Age of Onset  . Alcohol abuse Father     Social History Social History  Substance Use Topics  . Smoking status: Former Smoker    Packs/day: 1.50    Years: 40.00    Types: Cigarettes    Quit date: 10/22/2015  . Smokeless tobacco: Never Used  . Alcohol use 0.0 oz/week     Comment: 1-2 drinks yearly      Allergies   Albuterol; Other; Parsley seed; Silica [nutritional supplements]; Macrodantin [nitrofurantoin macrocrystal]; Neurontin [gabapentin]; Penicillins; and Sulfonamide derivatives   Review of Systems Review of Systems  All other systems reviewed and are negative.    Physical Exam Triage Vital Signs ED Triage Vitals  Enc Vitals Group     BP 07/13/16 1316 108/70     Pulse Rate 07/13/16 1316 65     Resp --      Temp --      Temp src --      SpO2 07/13/16 1316 100 %     Weight 07/13/16 1316 129 lb (58.5 kg)      Height --      Head Circumference --      Peak Flow --      Pain Score 07/13/16 1317 1     Pain Loc --      Pain Edu? --      Excl. in [place]? --    No data found.   Updated Vital Signs BP 108/70 (BP Location: Left Arm)   Pulse 65   Wt  129 lb (58.5 kg)   LMP 06/20/2014   SpO2 100%   BMI 20.20 kg/m   Visual Acuity Right Eye Distance:   Left Eye Distance:   Bilateral Distance:    Right Eye Near:   Left Eye Near:    Bilateral Near:     Physical Exam  Constitutional: She is oriented to person, place, and time. She appears well-developed and well-nourished. No distress.  HENT:  Head: Normocephalic and atraumatic.  Eyes: Pupils are equal, round, and reactive to light. No scleral icterus.  Neck: Normal range of motion. Neck supple.  Cardiovascular: Normal rate and regular rhythm.   Pulmonary/Chest: Effort normal.  Abdominal: She exhibits no distension.  Neurological: She is alert and oriented to person, place, and time.  Skin: Skin is warm and dry.  Psychiatric: She has a normal mood and affect. Her behavior is normal.   Left hand: Palmar aspect, tiny 2 mm superficial laceration. Well approximated. No active bleeding. No foreign body. No sign of infection. Mild tenderness of soft tissue of the entire hand including palmar aspect and especially thenar eminence at base of left thumb. Range of motion intact but some pain upon flexion and extension and abduction and abduction of left thumb. No swelling or ecchymosis . No bony tenderness. Sensation and neurovascular distally intact.  Left wrist: No definite deformity or tenderness. Range of motion intact  UC Treatments / Results  Labs (all labs ordered are listed, but only abnormal results are displayed) Labs Reviewed - No data to display  EKG  EKG Interpretation None       Radiology No results found.  Procedures Procedures (including critical care time)  Medications Ordered in UC Medications - No data to  display   Initial Impression / Assessment and Plan / UC Course  I have reviewed the triage vital signs and the nursing notes.  Pertinent labs & imaging results that were available during my care of the patient were reviewed by me and considered in my medical decision making (see chart for details).  Clinical Course    Based on history and physical exam, imaging is not indicated, no evidence of acute bony tenderness or deformity.  Final Clinical Impressions(s) / UC Diagnoses   Final diagnoses:  Neuralgia of left upper extremity  Contusion of left hand, initial encounter  Left hand with mild soft tissue tenderness palmar aspect left thumb, thenar eminence. Range of motion and function intact. Some pain upon flexion and extension of thumb. No fluctuance or sign of infection. Tiny superficial abrasion palmar aspect of hand, well approximating, not requiring repair. We discussed wound care.She states that last tetanus shot was less than 5 years ago. I explained that this is likely neuralgia of hand and thumb without evidence of bony involvement. Therefore we both agree that x-ray is not indicated. Will treat with Ace bandage, applied today left hand. Encourage rest, ice, compression with ACE bandage, and elevation of injured body part. She declined any prescription pain medication, although I offered to prescribe prescription Mobic 7.5 mg twice a day. Follow-up with PCP if no better 5 days, sooner if worse or new symptoms She voiced understanding of the above   Jacqulyn Cane, MD 07/14/16 1514

## 2016-07-14 ENCOUNTER — Encounter (HOSPITAL_COMMUNITY): Payer: Self-pay | Admitting: Psychiatry

## 2016-07-14 ENCOUNTER — Ambulatory Visit (INDEPENDENT_AMBULATORY_CARE_PROVIDER_SITE_OTHER): Payer: BLUE CROSS/BLUE SHIELD | Admitting: Psychiatry

## 2016-07-14 VITALS — BP 106/64 | HR 61 | Resp 12 | Ht 67.0 in | Wt 129.0 lb

## 2016-07-14 DIAGNOSIS — F5102 Adjustment insomnia: Secondary | ICD-10-CM | POA: Diagnosis not present

## 2016-07-14 DIAGNOSIS — F319 Bipolar disorder, unspecified: Secondary | ICD-10-CM | POA: Diagnosis not present

## 2016-07-14 DIAGNOSIS — Z87891 Personal history of nicotine dependence: Secondary | ICD-10-CM

## 2016-07-14 DIAGNOSIS — F411 Generalized anxiety disorder: Secondary | ICD-10-CM

## 2016-07-14 DIAGNOSIS — Z811 Family history of alcohol abuse and dependence: Secondary | ICD-10-CM | POA: Diagnosis not present

## 2016-07-14 DIAGNOSIS — Z888 Allergy status to other drugs, medicaments and biological substances status: Secondary | ICD-10-CM

## 2016-07-14 MED ORDER — LAMOTRIGINE 200 MG PO TABS: 200.0000 mg | ORAL_TABLET | Freq: Every day | ORAL | 1 refills | Status: DC

## 2016-07-14 MED ORDER — ARIPIPRAZOLE 10 MG PO TABS
10.0000 mg | ORAL_TABLET | Freq: Every day | ORAL | 0 refills | Status: DC
Start: 2016-07-14 — End: 2016-08-09

## 2016-07-14 MED ORDER — MIRTAZAPINE 7.5 MG PO TABS: 7.5000 mg | ORAL_TABLET | Freq: Every day | ORAL | 1 refills | Status: DC

## 2016-07-14 MED ORDER — DULOXETINE HCL 60 MG PO CPEP: 120.0000 mg | ORAL_CAPSULE | Freq: Every day | ORAL | 1 refills | Status: DC

## 2016-07-14 MED ORDER — TRAZODONE HCL 100 MG PO TABS: 150.0000 mg | ORAL_TABLET | Freq: Every day | ORAL | 0 refills | Status: DC

## 2016-07-14 NOTE — Telephone Encounter (Signed)
Received fax from [facility] requesting a refill for mirtazapine.  Per Dr. De Nurse, refills for Mirtazapine 15mg , #30 was sent to pharmacy.
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
## 2016-07-15 ENCOUNTER — Ambulatory Visit (INDEPENDENT_AMBULATORY_CARE_PROVIDER_SITE_OTHER): Payer: BLUE CROSS/BLUE SHIELD | Admitting: Physical Therapy

## 2016-07-15 ENCOUNTER — Encounter: Payer: Self-pay | Admitting: Physical Therapy

## 2016-07-15 DIAGNOSIS — M25671 Stiffness of right ankle, not elsewhere classified: Secondary | ICD-10-CM | POA: Diagnosis not present

## 2016-07-15 DIAGNOSIS — M79672 Pain in left foot: Secondary | ICD-10-CM

## 2016-07-15 DIAGNOSIS — R2689 Other abnormalities of gait and mobility: Secondary | ICD-10-CM

## 2016-07-15 DIAGNOSIS — M79671 Pain in right foot: Secondary | ICD-10-CM

## 2016-07-15 DIAGNOSIS — M6281 Muscle weakness (generalized): Secondary | ICD-10-CM

## 2016-07-15 DIAGNOSIS — M25672 Stiffness of left ankle, not elsewhere classified: Secondary | ICD-10-CM

## 2016-07-15 NOTE — Therapy (Signed)
[facility] [place], [place], [address] Phone: [phone]   Fax:  [phone]  Physical Therapy Treatment  Patient Details  Name: Mandy Shaw MRN: [id number] Date of Birth: [birth year] Referring Provider: Dr Sherrin Daisy  Encounter Date: 07/15/2016      PT End of Session - 07/15/16 1104    Visit Number 4   Number of Visits 8   Date for PT Re-Evaluation 07/28/16   Authorization Type has 30 visits per year, had used 11 so far including today.    PT Start Time 1104   PT Stop Time 1203   PT Time Calculation (min) 59 min   Activity Tolerance Patient tolerated treatment well      Past Medical History:  Diagnosis Date  . Arthritis   . Bipolar 1 disorder ([place])   . COPD (chronic obstructive pulmonary disease) ([place])   . GERD (gastroesophageal reflux disease)   . Insomnia   . Myasthenia gravis ([place])   . 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 Personality disorder   . Thyroid disease     Past Surgical History:  Procedure Laterality Date  . CARDIAC SURGERY     flap on heart removed  . CESAREAN SECTION    . CHOLECYSTECTOMY    . CONDYLOMA EXCISION/FULGURATION    . EYE SURGERY    . INCONTINENCE SURGERY    . INDUCED ABORTION    . LAPAROTOMY     exploratory for pain  . LEEP    . Left hip surgery  01/09/14   Dr. Arnette Norris  . right hip surgery  09/26/2013   Dr. Arnette Norris  . SHOULDER SURGERY Right 2015   Dr. Margretta [place]  . THYROIDECTOMY      There were no vitals filed for this visit.      Subjective Assessment - 07/15/16 1104    Subjective Pt hurt her Lt hand this week and presents with brace and ace wrap. Ava reports that she did have calf cramping still however the severity of them wasn't as bad. Thinks her fibromyalgia is kicked up.    Patient Stated Goals get rid of pain   Currently in Pain? Yes   Pain Location --  everything from her hips down super, super sore    Pain Descriptors / Indicators Sore   Pain Type Chronic pain   Pain Onset Yesterday   Pain Frequency Constant                         [facility] Adult PT Treatment/Exercise - 07/15/16 0001      Exercises   Exercises Other Exercises   Other Exercises  performed all NWB exercise per hand out today and reviewed standing HEP for when she has good days.      Moist Heat Therapy   Number Minutes Moist Heat 15 Minutes   Moist Heat Location Knee;Ankle  bilat     Ankle Exercises: Aerobic   Stationary Bike L1x3'     Ankle Exercises: Standing   Other Standing Ankle Exercises on BOSU  3x30sec     Ankle Exercises: Seated   BAPS Sitting;Level 3;10 reps                PT Education - 07/15/16 1137    Education provided Yes   Education Details modified HEP    Person(s) Educated Patient   Methods Explanation;Demonstration;Handout   Comprehension Verbalized understanding;Returned demonstration  PT Long Term Goals - 07/13/16 1246      PT LONG TERM GOAL #1   Title I with advanced HEP to include transition to the [employer] ( 07/28/16)    Time 4   Period Weeks   Status On-going     PT LONG TERM GOAL #2   Title report overall pain reduction in her feet =/> 50% ( 07/28/16)    Time 4   Period Weeks   Status On-going     PT LONG TERM GOAL #3   Title increase bilat ankle dorsiflexion =/> 12 degrees to assist with gait ( 07/28/16)    Time 4   Period Weeks     PT LONG TERM GOAL #4   Title improve FOTO =/< 41% limited ( 07/28/16)    Time 4   Period Weeks   Status On-going     PT LONG TERM GOAL #5   Title improve bilat hip strength =/> 4+/5 ( 07/28/16)    Time 4   Period Weeks   Status On-going               Plan - 07/15/16 1155    Clinical Impression Statement Mandy Shaw is concerned about using up all her PT visits as she only has 30 per year and this runs until June of next year. We will space out her visits and provide her with more exercise to  perform at home so she will continue to progress.  Pain is the biggest limiting factor for her.    Rehab Potential Fair   PT Frequency 1x / week  or every other week as schedule allows   PT Duration 4 weeks   PT Treatment/Interventions Moist Heat;Ultrasound;Therapeutic exercise;Dry needling;Taping;Vasopneumatic Device;Manual techniques;Cryotherapy;Gait training;Neuromuscular re-education;Electrical Stimulation;Patient/family education;Passive range of motion   PT Next Visit Plan will decrease frequency to once a week or every other week to conserve her visits.  Re-assess next visit.    Consulted and Agree with Plan of Care Patient      Patient will benefit from skilled therapeutic intervention in order to improve the following deficits and impairments:  Postural dysfunction, Decreased strength, Pain, Increased muscle spasms, Difficulty walking, Decreased range of motion  Visit Diagnosis: Pain in both feet  Muscle weakness (generalized)  Other abnormalities of gait and mobility  Joint stiffness of both ankles     Problem List Patient Active Problem List   Diagnosis Date Noted  . Trigger finger, left 04/15/2016  . Underweight 10/22/2015  . Malnutrition, calorie ([place]) 10/22/2015  . Pain in lower limb 09/17/2015  . Microalbuminuria 08/22/2015  . Tobacco abuse 09/17/2014  . Insomnia 04/09/2014  . 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 Hypothyroidism 07/05/2013  . Herpes 09/05/2012  . Hyperlipidemia 06/05/2010  . Bipolar disorder ([place]) 08/22/2009  . BORDERLINE PERSONALITY DISORDER 08/22/2009  . PTSD 08/22/2009  . Migraine headache 08/22/2009  . KNEE PAIN 08/22/2009  . COPD (chronic obstructive pulmonary disease) with chronic bronchitis ([place]) 07/23/2009  . RHEUMATOID ARTHRITIS 07/23/2009    Jeral Pinch PT  07/15/2016, 12:00 PM  [facility] [place],  [place], [address] Phone: [phone]   Fax:  [phone]  Name: Mandy Shaw MRN: [id number] Date of Birth: 1961-09-17

## 2016-07-15 NOTE — Patient Instructions (Addendum)
Strengthening: Hip Abduction (Side-Lying) lie all the way on your hip, slightly forward    Tighten muscles on front of left thigh, then lift leg __8-12__ inches from surface, keeping knee locked.  Repeat __5-10__ times per set. Do _1-2___ sets per session. Do __1__ sessions per day.  Strengthening: Hip Adduction (Side-Lying)    Tighten muscles on front of right thigh, then lift leg __4-6__ inches from surface, keeping knee locked.  Repeat __5-10__ times per set. Do _1-2___ sets per session. Do _1___ sessions per day.  Strengthening: Straight Leg Raise (Phase 1)    Tighten muscles on front of right thigh, then lift leg __14-20__ inches from surface, keeping knee locked.  Repeat __5-10__ times per set. Do _1-2___ sets per session. Do ___1_ sessions per day.  Strengthening: Hip Extension (Prone)    Tighten muscles on front of left thigh, then lift leg __6-8__ inches from surface, keeping knee locked. Repeat _5-10___ times per set. Do __1-2__ sets per session. Do __1__ sessions per day.  Strengthening: Quadriceps Set  CAN LIE DOWN FLAT FOR THIS    Tighten muscles on top of thighs by pushing knees down into surface. Hold __5__ seconds. Repeat __5-10__ times per set. Do __1__ sets per session. Do _1___ sessions per day.  FLEXION: Sitting - Resistance Band (Active)    Sit with right foot flat. Against resistance band, bend ankle, bringing toes toward head. Complete __1-2_ sets of _5-10__ repetitions. Perform _1__ sessions per day. Then move feet out to the sides.   Ankle Circles    Slowly rotate right foot and ankle clockwise then counterclockwise. Gradually increase range of motion. Avoid pain. Circle __10__ times each direction per set. Do __1-2__ sets per session. Do __1__ sessions per day.  Balance: Unilateral - on the good days.     Attempt to balance on left leg, eyes open. Hold __as long as able__ seconds. Repeat __5 attempts__ times per set. Do __1__ sets per  session. Do ____ sessions per day. Perform exercise with eyes closed.  Heel Raise: Bilateral (Standing)    Rise on balls of feet. Repeat _5-10___ times per set. Do _1-2___ sets per session. Do ____ sessions per day.  Balance: Three-Way Leg Swing - on the good days    Stand on left foot, hands on hips. Reach other foot forward _1___ times, sideways _1___ times, back _1___ times. Hold each position _1___ seconds. Relax. Repeat __5-10__ times per set. Do __1__ sets per session. Do ____ sessions per day.  Functional Quadriceps: Sit to Stand - on your good days   Sit on edge of chair, feet flat on floor. Stand upright, extending knees fully. Repeat __5-10__ times per set. Do __1__ sets per session. Do ____ sessions per day.   Supine: Leg Stretch With Strap (Advanced)    Lie on back with one knee bent, foot flat on floor. Hook strap around other foot. Straighten knee. Raise leg to maximal stretch and straighten knee further by tightening quadriceps muscle. Hold _30-45__ seconds. Warning: Intense stretch. Stay within tolerance.  Repeat _1__ times per session. Do _1__ sessions per day.  Copyright  VHI. All rights reserved.

## 2016-07-17 ENCOUNTER — Other Ambulatory Visit: Payer: Self-pay | Admitting: Family Medicine

## 2016-07-20 DIAGNOSIS — N763 Subacute and chronic vulvitis: Secondary | ICD-10-CM | POA: Diagnosis not present

## 2016-07-20 DIAGNOSIS — R350 Frequency of micturition: Secondary | ICD-10-CM | POA: Diagnosis not present

## 2016-07-20 DIAGNOSIS — K5900 Constipation, unspecified: Secondary | ICD-10-CM | POA: Diagnosis not present

## 2016-07-21 DIAGNOSIS — M5417 Radiculopathy, lumbosacral region: Secondary | ICD-10-CM | POA: Diagnosis not present

## 2016-07-22 ENCOUNTER — Encounter: Payer: Self-pay | Admitting: Physical Therapy

## 2016-07-27 ENCOUNTER — Encounter: Payer: Self-pay | Admitting: Physical Therapy

## 2016-07-27 NOTE — Telephone Encounter (Signed)
Patient on trazodone( see phone note).closing encounter

## 2016-08-02 ENCOUNTER — Encounter: Payer: Self-pay | Admitting: Family Medicine

## 2016-08-02 ENCOUNTER — Ambulatory Visit (INDEPENDENT_AMBULATORY_CARE_PROVIDER_SITE_OTHER): Payer: BLUE CROSS/BLUE SHIELD

## 2016-08-02 ENCOUNTER — Ambulatory Visit (INDEPENDENT_AMBULATORY_CARE_PROVIDER_SITE_OTHER): Payer: BLUE CROSS/BLUE SHIELD | Admitting: Podiatry

## 2016-08-02 ENCOUNTER — Ambulatory Visit (INDEPENDENT_AMBULATORY_CARE_PROVIDER_SITE_OTHER): Payer: BLUE CROSS/BLUE SHIELD | Admitting: Family Medicine

## 2016-08-02 ENCOUNTER — Other Ambulatory Visit (HOSPITAL_COMMUNITY): Payer: Self-pay | Admitting: Psychiatry

## 2016-08-02 ENCOUNTER — Encounter: Payer: Self-pay | Admitting: Podiatry

## 2016-08-02 VITALS — BP 121/67 | HR 77 | Ht 64.0 in | Wt 120.5 lb

## 2016-08-02 DIAGNOSIS — M25551 Pain in right hip: Secondary | ICD-10-CM

## 2016-08-02 DIAGNOSIS — M25562 Pain in left knee: Secondary | ICD-10-CM | POA: Diagnosis not present

## 2016-08-02 DIAGNOSIS — M4697 Unspecified inflammatory spondylopathy, lumbosacral region: Secondary | ICD-10-CM | POA: Diagnosis not present

## 2016-08-02 DIAGNOSIS — M25561 Pain in right knee: Secondary | ICD-10-CM

## 2016-08-02 DIAGNOSIS — M4696 Unspecified inflammatory spondylopathy, lumbar region: Secondary | ICD-10-CM

## 2016-08-02 DIAGNOSIS — M545 Low back pain, unspecified: Secondary | ICD-10-CM

## 2016-08-02 DIAGNOSIS — G5603 Carpal tunnel syndrome, bilateral upper limbs: Secondary | ICD-10-CM | POA: Diagnosis not present

## 2016-08-02 DIAGNOSIS — L600 Ingrowing nail: Secondary | ICD-10-CM | POA: Diagnosis not present

## 2016-08-02 DIAGNOSIS — G5750 Tarsal tunnel syndrome, unspecified lower limb: Secondary | ICD-10-CM | POA: Diagnosis not present

## 2016-08-02 DIAGNOSIS — M79673 Pain in unspecified foot: Secondary | ICD-10-CM

## 2016-08-02 DIAGNOSIS — Q828 Other specified congenital malformations of skin: Secondary | ICD-10-CM | POA: Diagnosis not present

## 2016-08-02 DIAGNOSIS — B351 Tinea unguium: Secondary | ICD-10-CM

## 2016-08-02 DIAGNOSIS — M79606 Pain in leg, unspecified: Secondary | ICD-10-CM

## 2016-08-02 NOTE — Patient Instructions (Signed)
Seen for hypertrophic nails and painful callus under right foot. Doing well with Physical therapy. All nails and painful lesions debrided. May benefit from custom made orthotics to accommodate painful lesion and support instep area. Return in 2 months or sooner if preparing for orthotics.

## 2016-08-02 NOTE — Progress Notes (Signed)
  Subjective:    CC:   HPI: Patient is here today to follow-up on multiple issues. First her carpal tunnel. She says she's been wearing the night splints for about 8 weeks and really hasn't noticed any improvement. She still getting some numbness and tingling in her hands. And she still getting a lot of pain in the wrists and the finger joints.  She also has significant pain in her right hip. She's noticed that her right foot has been turning inward over the last couple of weeks. She's noticed it just keeps occurring and she'll try to self corrected. The right hip is more painful when she's been sitting for long periods time and tries to stand up. It's work was worse with prolonged walking as well.  She's also had a lot of pain in her right foot but saw the podiatrist today. They're recommending custom orthotics.  She recently started chair yoga at the [facility] and has been enjoying it.  She also complains of low back pain that's been bothering her for the last couple of weeks. No known injury or trauma. She says it starts directly over the lumbar spine and then radiates out bilaterally. Worse with range of motion.   Past medical history, Surgical history, Family history not pertinant except as noted below, Social history, Allergies, and medications have been entered into the medical record, reviewed, and corrections made.   Review of Systems: No fevers, chills, night sweats, weight loss, chest pain, or shortness of breath.   Objective:    General: Well Developed, well nourished, and in no acute distress.  Neuro: Alert and oriented x3, extra-ocular muscles intact, sensation grossly intact.  HEENT: Normocephalic, atraumatic  Skin: Warm and dry, no rashes. Cardiac: Regular rate and rhythm, no murmurs rubs or gallops, no lower extremity edema.  Respiratory: Clear to auscultation bilaterally. Not using accessory muscles, speaking in full sentences. MSK: Normal lumbar flexion and extension that  she did have pain with both. Normal rotation right and left that she had significant discomfort with both. And normal side bending right and left but pain with both. She is extremely tender over the lumbar spine. Less tender over the paraspinous muscles. She is extremely tender over the right greater trochanter bursa. Hip with normal flexion extension rotation inward and outward.   Impression and Recommendations:    Right hip bursitis-we'll give handout for exercises. Consider corticosteroid injection for pain relief. Also will get x-rays today.   Knee pain, bilat -she does have some medial swelling. We'll get x-rays today.  Low back pain, midline - says she has pain directly over the spine we'll also get x-rays today for further evaluation and to better quantify how much arthritis she may have.  Carpal tunnel syndrome-since she is not improved after 8 weeks of splinting recommend injections for further treatment. She can schedule this with one of our sports medicine doctors.

## 2016-08-02 NOTE — Telephone Encounter (Signed)
Received fax from [facility] requesting a refill for Abilify. Per Dr. De Nurse, refill request is denied. Pt has request refill too soon. Refill was sent to pharmacy on 10/25. Pt is schedule for a f/u apt on 12/19. lvm for pt to return call to office.

## 2016-08-02 NOTE — Progress Notes (Signed)
SUBJECTIVE: 55 y.o. year old female presents stating that nails need trimming. Also got a callus that is very painful under the right foot near 5th toe joint duration of 2-3 weeks. She is pleased to be able to return in 2 months rather than 3 months interval. Her foot was hurting too much to go another day.  She was getting physical therapy for painful Tarsal tunnel syndrome. Stated that the therapy is helping a lot. Also wearing good Tennis shoes.    Co existing issues: Has had problem with both knees. Hurt to lift up from squatting position or going up on stairs. Diagnosed with Fibromyalgia.   OBJECTIVE: DERMATOLOGIC EXAMINATION: Deformed irregular nail border with pain on both great toes. Positive history of both great toe nail surgeries on both borders. Thick protruding tyloma under 5th MPJ area right foot, symptomatic. VASCULAR EXAMINATION OF LOWER LIMBS: All pedal pulses are faintly palpable. Capillary Filling times within 3 seconds in all digits.  Temperature gradient from tibial crest to dorsum of foot is within normal bilateral. NEUROLOGIC EXAMINATION OF THE LOWER LIMBS: Subjective burning pain at dorsum of both feet, gets worse after prolonged ambulation.  Pain at rest in bed or sitting in chair. Some times pain is when the legs are crossed.  Has had failed to respond to Monofilament sensory testing with normal vibratory sensory.  MUSCULOSKELETAL EXAMINATION: High arched cavus type foot bilateral. Plantar flexed 5th metatarsal right.  ASSESSMENT: 1. Anterior Tarsal tunnel syndrome bilateral. 2. Symptomatic ingrown nails both great toes due to residual nail growth following nail ingrown nail surgery. 3. Peripheral neuropathy. 4. Pain in lower limb.  5. Plantar keratosis under 5th MPJ right foot, symptomatic.  PLAN: Reviewed clinical findings and available treatment options. Continue with physical therapy for Anterior Tarsal tunnel syndrome.  All nails debrided. Painful  callus debrided under right foot. Reviewed benefit of custom orthotics to accommodate painful lesion and Tarsal tunnel syndrome.

## 2016-08-03 ENCOUNTER — Ambulatory Visit (HOSPITAL_COMMUNITY): Payer: Self-pay | Admitting: Psychiatry

## 2016-08-05 DIAGNOSIS — R801 Persistent proteinuria, unspecified: Secondary | ICD-10-CM | POA: Diagnosis not present

## 2016-08-05 DIAGNOSIS — N251 Nephrogenic diabetes insipidus: Secondary | ICD-10-CM | POA: Diagnosis not present

## 2016-08-05 DIAGNOSIS — G43719 Chronic migraine without aura, intractable, without status migrainosus: Secondary | ICD-10-CM | POA: Diagnosis not present

## 2016-08-05 DIAGNOSIS — N183 Chronic kidney disease, stage 3 (moderate): Secondary | ICD-10-CM | POA: Diagnosis not present

## 2016-08-05 DIAGNOSIS — N281 Cyst of kidney, acquired: Secondary | ICD-10-CM | POA: Diagnosis not present

## 2016-08-08 ENCOUNTER — Other Ambulatory Visit (HOSPITAL_COMMUNITY): Payer: Self-pay | Admitting: Psychiatry

## 2016-08-09 ENCOUNTER — Ambulatory Visit: Payer: BLUE CROSS/BLUE SHIELD | Admitting: Podiatry

## 2016-08-09 ENCOUNTER — Telehealth (HOSPITAL_COMMUNITY): Payer: Self-pay | Admitting: Psychiatry

## 2016-08-09 ENCOUNTER — Encounter: Payer: Self-pay | Admitting: Sports Medicine

## 2016-08-09 ENCOUNTER — Ambulatory Visit (INDEPENDENT_AMBULATORY_CARE_PROVIDER_SITE_OTHER): Payer: BLUE CROSS/BLUE SHIELD | Admitting: Sports Medicine

## 2016-08-09 DIAGNOSIS — M7061 Trochanteric bursitis, right hip: Secondary | ICD-10-CM | POA: Diagnosis not present

## 2016-08-09 DIAGNOSIS — M25551 Pain in right hip: Secondary | ICD-10-CM | POA: Insufficient documentation

## 2016-08-09 DIAGNOSIS — G5603 Carpal tunnel syndrome, bilateral upper limbs: Secondary | ICD-10-CM

## 2016-08-09 DIAGNOSIS — M25552 Pain in left hip: Secondary | ICD-10-CM

## 2016-08-09 MED ORDER — TRAZODONE HCL 100 MG PO TABS: 100.0000 mg | ORAL_TABLET | Freq: Every day | ORAL | 0 refills | Status: DC

## 2016-08-09 MED ORDER — ARIPIPRAZOLE 10 MG PO TABS: 10.0000 mg | ORAL_TABLET | Freq: Every day | ORAL | 0 refills | Status: DC

## 2016-08-09 MED ORDER — TRAZODONE HCL 50 MG PO TABS: 50.0000 mg | ORAL_TABLET | Freq: Every day | ORAL | 0 refills | Status: DC

## 2016-08-09 NOTE — Progress Notes (Signed)
Subjective:    I'm seeing this patient as a consultation for:  Dr. Beatrice Lecher  CC: Carpal tunnel syndrome, hip pain  HPI: This is a pleasant 55 year old female, she had fairly classic tunnel symptoms, has done over one month of nighttime splinting. Unfortunately continues to have numbness and tingling, electrical-like pains in the volar forearm extending through the first through fourth fingers, severe, persistent, no trauma.  Right hip pain: Moderate, persistent, localized over the greater trochanter, no radiation.  Past medical history:  Negative.  See flowsheet/record as well for more information.  Surgical history: Negative.  See flowsheet/record as well for more information.  Family history: Negative.  See flowsheet/record as well for more information.  Social history: Negative.  See flowsheet/record as well for more information.  Allergies, and medications have been entered into the medical record, reviewed, and no changes needed.   Review of Systems: No headache, visual changes, nausea, vomiting, diarrhea, constipation, dizziness, abdominal pain, skin rash, fevers, chills, night sweats, weight loss, swollen lymph nodes, body aches, joint swelling, muscle aches, chest pain, shortness of breath, mood changes, visual or auditory hallucinations.   Objective:   General: Well Developed, well nourished, and in no acute distress.  Neuro/Psych: Alert and oriented x3, extra-ocular muscles intact, able to move all 4 extremities, sensation grossly intact. Skin: Warm and dry, no rashes noted.  Respiratory: Not using accessory muscles, speaking in full sentences, trachea midline.  Cardiovascular: Pulses palpable, no extremity edema. Abdomen: Does not appear distended. Bilateral Wrist: Inspection normal with no visible erythema or swelling. ROM smooth and normal with good flexion and extension and ulnar/radial deviation that is symmetrical with opposite wrist. Palpation is normal  over metacarpals, navicular, lunate, and TFCC; tendons without tenderness/ swelling No snuffbox tenderness. No tenderness over Canal of [place]. Strength 5/5 in all directions without pain. Negative Finkelstein, positive tinel's and phalens. Negative Watson's test. Right Hip: ROM IR: 60 Deg, ER: 60 Deg, Flexion: 120 Deg, Extension: 100 Deg, Abduction: 45 Deg, Adduction: 45 Deg Strength IR: 5/5, ER: 5/5, Flexion: 5/5, Extension: 5/5, Abduction: 5/5, Adduction: 5/5 Pelvic alignment unremarkable to inspection and palpation. Standing hip rotation and gait without trendelenburg / unsteadiness. Greater trochanter with tenderness to palpation, hip abductor's are very weak. No tenderness over piriformis. No SI joint tenderness and normal minimal SI movement.  Procedure: Real-time Ultrasound Guided right median nerve hydrodissection Device: GE Logiq E  Verbal informed consent obtained.  Time-out conducted.  Noted no overlying erythema, induration, or other signs of local infection.  Skin prepped in a sterile fashion.  Local anesthesia: Topical Ethyl chloride.  With sterile technique and under real time ultrasound guidance:  Using a 25-gauge needle and taking care to avoid intraneural injection I placed medication both superficial to and deep to the median nerve at the carpal tunnel for a total of 1 mL kenalog 40, 5 mL lidocaine. Completed without difficulty  Pain immediately resolved suggesting accurate placement of the medication.  Advised to call if fevers/chills, erythema, induration, drainage, or persistent bleeding.  Images permanently stored and available for review in the ultrasound unit.  Impression: Technically successful ultrasound guided injection.  Procedure: Real-time Ultrasound Guided left median nerve hydrodissection Device: GE Logiq E  Verbal informed consent obtained.  Time-out conducted.  Noted no overlying erythema, induration, or other signs of local infection.  Skin  prepped in a sterile fashion.  Local anesthesia: Topical Ethyl chloride.  With sterile technique and under real time ultrasound guidance:  Using a 25-gauge needle and  taking care to avoid intraneural injection I placed medication both superficial to and deep to the median nerve at the carpal tunnel for a total of 1 mL kenalog 40, 5 mL lidocaine. Completed without difficulty  Pain immediately resolved suggesting accurate placement of the medication.  Advised to call if fevers/chills, erythema, induration, drainage, or persistent bleeding.  Images permanently stored and available for review in the ultrasound unit.  Impression: Technically successful ultrasound guided injection.  Impression and Recommendations:   This case required medical decision making of moderate complexity.  Carpal tunnel syndrome, bilateral Failed nighttime splinting, bilateral median nerve hydrodissection as above, continue splinting at night. Return to see me in one month.  Greater trochanteric bursitis, right We will start with home rehabilitation exercises before considering injection.

## 2016-08-09 NOTE — Telephone Encounter (Signed)
Allowed it may cost more as 2 prescriptions.  Otherwise can be called in if patient ok

## 2016-08-09 NOTE — Telephone Encounter (Signed)
Medication refill- pt phone into office requesting refills for Trazodone and Abilify. Per Dr. De Nurse, refills are authorize for Abilify 10mg , #30, Trazodone 100mg , #30 and Trazodone 50mg , #30. Rx's were sent to pharmacy. Called and informed pt of refill status. Pt f/u apt is schedule for 12/19. Pt verbalizes understanding.

## 2016-08-09 NOTE — Assessment & Plan Note (Signed)
Failed nighttime splinting, bilateral median nerve hydrodissection as above, continue splinting at night. Return to see me in one month.

## 2016-08-09 NOTE — Telephone Encounter (Signed)
Pt called to get refill on abilify and also trazadone.   Pt would like to ask if she can get 2 rx for the trazadone. She takes 150mg  tabs.  She would like to get 1 rx for 100 mg and 1 rx for 50mg  That way she does not have to cut a pill in half.

## 2016-08-09 NOTE — Telephone Encounter (Signed)
[facility] called and left a message on Vm up front. Pt is requesting refill on abilify. She knows it is early but request for her pill box to be taken care of. Can we refill the rx?

## 2016-08-09 NOTE — Assessment & Plan Note (Signed)
We will start with home rehabilitation exercises before considering injection.

## 2016-08-10 ENCOUNTER — Encounter: Payer: Self-pay | Admitting: Physical Therapy

## 2016-08-10 ENCOUNTER — Encounter: Payer: BLUE CROSS/BLUE SHIELD | Admitting: Physical Therapy

## 2016-08-10 ENCOUNTER — Other Ambulatory Visit: Payer: Self-pay | Admitting: Family Medicine

## 2016-08-16 ENCOUNTER — Ambulatory Visit (INDEPENDENT_AMBULATORY_CARE_PROVIDER_SITE_OTHER): Payer: Medicare Other | Admitting: Podiatry

## 2016-08-16 ENCOUNTER — Encounter: Payer: Self-pay | Admitting: Podiatry

## 2016-08-16 DIAGNOSIS — Q828 Other specified congenital malformations of skin: Secondary | ICD-10-CM

## 2016-08-16 DIAGNOSIS — G5750 Tarsal tunnel syndrome, unspecified lower limb: Secondary | ICD-10-CM

## 2016-08-16 DIAGNOSIS — M216X1 Other acquired deformities of right foot: Secondary | ICD-10-CM | POA: Diagnosis not present

## 2016-08-16 DIAGNOSIS — M216X2 Other acquired deformities of left foot: Secondary | ICD-10-CM | POA: Diagnosis not present

## 2016-08-16 DIAGNOSIS — M21969 Unspecified acquired deformity of unspecified lower leg: Secondary | ICD-10-CM

## 2016-08-16 NOTE — Progress Notes (Signed)
SUBJECTIVE: 55 y.o. year old female presents requesting custom orthotics. States that she was recently diagnosed with Myasthenia Gravis. She also request calluses trimmed.  Co existing issues: Has had problem with both knees. Hurt to lift up from squatting position or going up on stairs. Diagnosed with Fibromyalgia.   OBJECTIVE: DERMATOLOGIC EXAMINATION: Deformed hallucal nails bilateral. Recurring callus under 5th MPJ right. VASCULAR EXAMINATION OF LOWER LIMBS: All pedal pulses are faintly palpable. Capillary Filling times within 3 seconds in all digits.  Temperature gradient from tibial crest to dorsum of foot is within normal bilateral. NEUROLOGIC EXAMINATION OF THE LOWER LIMBS: No new changes. Subjective burning pain at dorsum of both feet, gets worse after prolonged ambulation.  Pain at rest in bed or sitting in chair. Some times pain is when the legs are crossed.  Has had failed to respond to Monofilament sensory testing with normal vibratory sensory.  MUSCULOSKELETAL EXAMINATION: High arched cavus type foot bilateral. Plantar flexed 5th metatarsal right.  ASSESSMENT: 1. Anterior Tarsal tunnel syndrome bilateral. 2. Symptomatic ingrown nails both great toes due to residual nail growth following nail ingrown nail surgery. 3. Peripheral neuropathy. 4. Pain in lower limb.  5. Plantar keratosis under 5th MPJ right foot, symptomatic.  PLAN: Both feet casted for orthotics.  Plantar calluses debrided. Will contact patient when orthotics are ready.

## 2016-08-16 NOTE — Patient Instructions (Signed)
Both feet casted for orthotics. Plantar calluses debrided.

## 2016-08-19 ENCOUNTER — Encounter: Payer: Self-pay | Admitting: Physical Therapy

## 2016-08-19 ENCOUNTER — Ambulatory Visit (INDEPENDENT_AMBULATORY_CARE_PROVIDER_SITE_OTHER): Payer: BLUE CROSS/BLUE SHIELD | Admitting: Physical Therapy

## 2016-08-19 DIAGNOSIS — M25672 Stiffness of left ankle, not elsewhere classified: Secondary | ICD-10-CM

## 2016-08-19 DIAGNOSIS — M79672 Pain in left foot: Secondary | ICD-10-CM

## 2016-08-19 DIAGNOSIS — M6281 Muscle weakness (generalized): Secondary | ICD-10-CM

## 2016-08-19 DIAGNOSIS — M25671 Stiffness of right ankle, not elsewhere classified: Secondary | ICD-10-CM | POA: Diagnosis not present

## 2016-08-19 DIAGNOSIS — M79671 Pain in right foot: Secondary | ICD-10-CM

## 2016-08-19 DIAGNOSIS — R2689 Other abnormalities of gait and mobility: Secondary | ICD-10-CM | POA: Diagnosis not present

## 2016-08-19 NOTE — Addendum Note (Signed)
Addended by: Treshawn Allen E on: 08/19/2016 02:46 PM   Modules accepted: Orders

## 2016-08-19 NOTE — Therapy (Signed)
[facility] [place], [place], [address] Phone: [phone]   Fax:  [phone]  Physical Therapy Treatment  Patient Details  Name: Mandy Shaw MRN: [id number] Date of Birth: [birth year] Referring Provider: Dr Sherrin Daisy  Encounter Date: 08/19/2016      PT End of Session - 08/19/16 1420    Visit Number 5   PT Start Time 5366   PT Stop Time 1459   PT Time Calculation (min) 37 min   Activity Tolerance Patient tolerated treatment well      Past Medical History:  Diagnosis Date  . Arthritis   . Bipolar 1 disorder ([place])   . COPD (chronic obstructive pulmonary disease) ([place])   . GERD (gastroesophageal reflux disease)   . Insomnia   . Myasthenia gravis ([place])   . 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 Personality disorder   . Thyroid disease     Past Surgical History:  Procedure Laterality Date  . CARDIAC SURGERY     flap on heart removed  . CESAREAN SECTION    . CHOLECYSTECTOMY    . CONDYLOMA EXCISION/FULGURATION    . EYE SURGERY    . INCONTINENCE SURGERY    . INDUCED ABORTION    . LAPAROTOMY     exploratory for pain  . LEEP    . Left hip surgery  01/09/14   Dr. Arnette Norris  . right hip surgery  09/26/2013   Dr. Arnette Norris  . SHOULDER SURGERY Right 2015   Dr. Margretta Sidle  . THYROIDECTOMY      There were no vitals filed for this visit.      Subjective Assessment - 08/19/16 1424    Subjective Pt reports she has been going to the [facility] and chair yoga about twice a week, then on Fridays half an hour of line dancing.  Her nephrologist told her she has a new dx and was placed on potassium to help.    Currently in Pain? Yes   Pain Location Foot   Pain Orientation Left;Right   Pain Descriptors / Indicators Throbbing;Cramping;Aching   Pain Type Chronic pain   Pain Radiating Towards in the arches with cramping over the last two weeks.    Pain Onset More than a month  ago   Pain Frequency Constant   Aggravating Factors  not sure - just hurts   Pain Relieving Factors nothing            [facility] PT Assessment - 08/19/16 0001      Assessment   Medical Diagnosis bilat tarsal tunnel syndrome   Referring Provider Dr Sherrin Daisy   Onset Date/Surgical Date 03/30/16     Observation/Other Assessments   Focus on Therapeutic Outcomes (FOTO)  55% limited  was 56% at initial eval.      ROM / Strength   AROM / PROM / Strength AROM;PROM;Strength     AROM   Right/Left Ankle Left;Right   Right Ankle Dorsiflexion 4   Left Ankle Dorsiflexion 8     PROM   Right Ankle Dorsiflexion 6   Left Ankle Dorsiflexion 10     Strength   Strength Assessment Site Hip;Knee;Ankle   Right Hip Flexion 5/5   Right Hip Extension 4-/5   Right Hip ABduction 4+/5   Left Hip Flexion 5/5   Left Hip Extension 4/5   Left Hip ABduction --  5-/5   Right/Left Knee --  bilat hamstrings 5/5, quads cogwheel  resistance Rt 4+, Lt 4-   Right/Left Ankle --  bilat ankles 3+/5                     [facility] Adult PT Treatment/Exercise - 08/19/16 0001      Self-Care   Self-Care Other Self-Care Comments   Other Self-Care Comments  reviewed current HEP handouts, made some modifications to the exercise that MD issued her for hip bursitis.      Ankle Exercises: Aerobic   Stationary Bike nustep L4x5'     Ankle Exercises: Supine   Other Supine Ankle Exercises bridges pushing through heels     Ankle Exercises: Stretches   Other Stretch supine ITB stretch cross body with strap, heel stretch off step.                      PT Long Term Goals - 08/19/16 1436      PT LONG TERM GOAL #1   Title I with advanced HEP to include transition to the [facility] ( 07/28/16)    Status Achieved     PT LONG TERM GOAL #2   Title report overall pain reduction in her feet =/> 50% ( 07/28/16)    Status Not Met  no improvement     PT LONG TERM GOAL #3   Title increase bilat ankle  dorsiflexion =/> 12 degrees to assist with gait ( 07/28/16)    Status Not Met     PT LONG TERM GOAL #4   Title improve FOTO =/< 41% limited ( 07/28/16)    Status Not Met  scored 55% limited     PT LONG TERM GOAL #5   Title improve bilat hip strength =/> 4+/5 ( 07/28/16)    Status Partially Met  see above               Plan - 08/19/16 1501    Clinical Impression Statement Malijah has partially met her goals.  She has increased strength in her hips however no change in her knees/ankles.  Ankle ROM is pretty much unchanged as well.  Her functional abilities are limited by joint pain.  She performs her exercise limited as her pain allows.  She is ageeable to continuing with her HEP to include new revisions we made today and going to the [facility].  She follows up with her primary MD.     Consulted and Agree with Plan of Care Patient      Patient will benefit from skilled therapeutic intervention in order to improve the following deficits and impairments:     Visit Diagnosis: Pain in both feet  Muscle weakness (generalized)  Other abnormalities of gait and mobility  Joint stiffness of both ankles     Problem List Patient Active Problem List   Diagnosis Date Noted  . Carpal tunnel syndrome, bilateral 08/09/2016  . Greater trochanteric bursitis, right 08/09/2016  . Trigger finger, left 04/15/2016  . Underweight 10/22/2015  . Malnutrition, calorie ([place]) 10/22/2015  . Pain in lower limb 09/17/2015  . Microalbuminuria 08/22/2015  . Tobacco abuse 09/17/2014  . Insomnia 04/09/2014  . 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 Hypothyroidism 07/05/2013  . Herpes 09/05/2012  . Hyperlipidemia 06/05/2010  . Bipolar disorder ([place]) 08/22/2009  . BORDERLINE PERSONALITY DISORDER 08/22/2009  . PTSD 08/22/2009  . Migraine headache 08/22/2009  . KNEE PAIN 08/22/2009  . COPD (chronic obstructive pulmonary disease) with chronic bronchitis ([place])  07/23/2009  . RHEUMATOID ARTHRITIS 07/23/2009    Jeral Pinch PT  08/19/2016, 3:04 PM  [facility] [place], [place], [address] Phone: [phone]   Fax:  [phone]  Name: MOSELLE RISTER MRN: [id number] Date of Birth: [birth year]  PHYSICAL THERAPY DISCHARGE SUMMARY  Visits from Start of Care: 5  Current functional level related to goals / functional outcomes: See above   Remaining deficits: Joint pain with weightbearing and functional activities.    Education / Equipment: HEP and [employer] program Plan: Patient agrees to discharge.  Patient goals were partially met. Patient is being discharged due to lack of progress.  ?????Wondering if patient would benefit from blood work and/or rheumatology work up due to overall joint pain .     Jeral Pinch, PT 08/19/16 3:06 PM

## 2016-08-19 NOTE — Patient Instructions (Addendum)
Outer Hip Stretch: Reclined IT Band Stretch (Strap)    Strap around opposite foot, pull across only as far as possible with shoulders on mat. Hold for _30___ secs. Repeat _1-3___ times each leg.   Plantar Fascia, Standing    Stand on stairs or curb. Lower heel. Hold 30___ seconds.  Repeat __1-3_ times per session. Do _1__ sessions per day.  Bridging - ball between the knees    Slowly raise buttocks from floor, keeping stomach tight. Repeat __10__ times per set. Do _1-3___ sets per session. Do __1__ sessions per day.

## 2016-08-20 ENCOUNTER — Encounter: Payer: Self-pay | Admitting: Family Medicine

## 2016-08-20 ENCOUNTER — Ambulatory Visit (INDEPENDENT_AMBULATORY_CARE_PROVIDER_SITE_OTHER): Payer: BLUE CROSS/BLUE SHIELD | Admitting: Family Medicine

## 2016-08-20 VITALS — BP 97/59 | HR 94 | Ht 64.0 in | Wt 120.0 lb

## 2016-08-20 DIAGNOSIS — I952 Hypotension due to drugs: Secondary | ICD-10-CM | POA: Diagnosis not present

## 2016-08-20 DIAGNOSIS — J449 Chronic obstructive pulmonary disease, unspecified: Secondary | ICD-10-CM

## 2016-08-20 DIAGNOSIS — R0602 Shortness of breath: Secondary | ICD-10-CM | POA: Diagnosis not present

## 2016-08-20 DIAGNOSIS — R42 Dizziness and giddiness: Secondary | ICD-10-CM

## 2016-08-20 MED ORDER — IPRATROPIUM BROMIDE 0.02 % IN SOLN
0.5000 mg | Freq: Once | RESPIRATORY_TRACT | Status: AC
  Administered 2016-08-20: 0.5 mg via RESPIRATORY_TRACT

## 2016-08-20 MED ORDER — IPRATROPIUM BROMIDE HFA 17 MCG/ACT IN AERS: 2.0000 | INHALATION_SPRAY | Freq: Four times a day (QID) | RESPIRATORY_TRACT | 2 refills | Status: DC | PRN

## 2016-08-20 NOTE — Patient Instructions (Signed)
Atroven - 2 puffs about 30 min before exercise and see if helps.

## 2016-08-20 NOTE — Progress Notes (Signed)
Subjective:    CC: COPD  HPI:  Has been doing some cardio and strength training classes. She has been getting SOB and dizziness at time. Usually takes about 10 to recover once stops and rests. She has been really trying to increase her exercise.  She does have a diagnosis of COPD. She does not tolerate albuterol and in fact we have it on her intolerance list. She really wants to avoid a steroid if possible. She's also been outside visiting a [facility] in [place] for [facility]. She is really bonding with one of the horses there named Mercy.  She finally came in today because [place] the farm [place] she got very short of breath and felt like she had to go back to her car and sit down. While trying to lift up the lift gate on the back of the car she got extremely dizzy and felt like she was a muscular pass out but she did not. They did increase her HCTZ to twice a day about 2 days ago but that was after the near syncopal episode.  He also wants let me know that she is actually been diagnosed with myasthenia gravis through Dr. Trula Ore high.  BP (!) 97/59   Pulse 94   Ht 5\' 4"  (1.626 m)   Wt 120 lb (54.4 kg)   LMP 06/20/2014   SpO2 99%   BMI 20.60 kg/m     Allergies  Allergen Reactions  . Albuterol Anaphylaxis  . Other Anaphylaxis    MANGO, PARSLEY.  [name] Seed Anaphylaxis  . Silica [Nutritional Supplements] Itching  . Macrodantin [Nitrofurantoin Macrocrystal] Itching  . Neurontin [Gabapentin]   . Penicillins     REACTION: Anaphalatic  . Sulfonamide Derivatives     REACTION: Anaphalatic    Past Medical History:  Diagnosis Date  . Arthritis   . Bipolar 1 disorder ([place])   . COPD (chronic obstructive pulmonary disease) ([place])   . GERD (gastroesophageal reflux disease)   . Insomnia   . Myasthenia gravis ([place])   . 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 Personality disorder   . Thyroid disease     Past Surgical History:  Procedure  Laterality Date  . CARDIAC SURGERY     flap on heart removed  . CESAREAN SECTION    . CHOLECYSTECTOMY    . CONDYLOMA EXCISION/FULGURATION    . EYE SURGERY    . INCONTINENCE SURGERY    . INDUCED ABORTION    . LAPAROTOMY     exploratory for pain  . LEEP    . Left hip surgery  01/09/14   Dr. Arnette Norris  . right hip surgery  09/26/2013   Dr. Arnette Norris  . SHOULDER SURGERY Right 2015   Dr. Margretta Sidle  . THYROIDECTOMY      Social History   Social History  . Marital status: Married    Spouse name: N/A  . Number of children: 1  . Years of education: N/A   Occupational History  . disabled    Social History Main Topics  . Smoking status: Former Smoker    Packs/day: 1.50    Years: 40.00    Types: Cigarettes    Quit date: 10/22/2015  . Smokeless tobacco: Never Used  . Alcohol use 0.0 oz/week     Comment: 1-2 drinks yearly   . Drug use: No  . Sexual activity: Yes    Partners: Male  Comment: married, 1 daughter, 1  1/2 yrs college, self-employed, no reg exercise, 3 caffeine drinks daily.   Other Topics Concern  . Not on file   Social History Narrative   Husband is legally blind. Likes spending weekends at [employer] in [place]. Working our regularly.     Family History  Problem Relation Age of Onset  . Alcohol abuse Father     Outpatient Encounter Prescriptions as of 08/20/2016  Medication Sig  . ARIPiprazole (ABILIFY) 10 MG tablet Take 1 tablet (10 mg total) by mouth daily.  Marland Kitchen azaTHIOprine (IMURAN) 50 MG tablet Take 50 mg by mouth 2 (two) times daily.  . Calcium Carb-Cholecalciferol (CALCIUM 600 + D PO) Take by mouth.  . DOCUSATE CALCIUM PO Take by mouth.  . DULoxetine (CYMBALTA) 60 MG capsule Take 2 capsules (120 mg total) by mouth daily.  . famotidine (PEPCID) 20 MG tablet take 1 tablet by mouth daily  . hydrochlorothiazide (HYDRODIURIL) 25 MG tablet Take 25 mg by mouth 2 (two) times daily.  Marland Kitchen lamoTRIgine (LAMICTAL) 200 MG tablet Take 1 tablet (200 mg  total) by mouth daily. One per day.  . levothyroxine (SYNTHROID, LEVOTHROID) 125 MCG tablet take 1 tablet by mouth daily  . mirtazapine (REMERON) 7.5 MG tablet Take 1 tablet (7.5 mg total) by mouth at bedtime. One at night  . Multiple Vitamins-Minerals (CENTRUM SILVER ADULT 50+ PO) Take 1 capsule by mouth daily.  . Multiple Vitamins-Minerals (HAIR SKIN AND NAILS FORMULA PO) Take 1 tablet by mouth daily.  . primidone (MYSOLINE) 50 MG tablet   . rizatriptan (MAXALT) 10 MG tablet take 1 tablet by mouth AT ONSET OF HEADACHE. may repeat after 2 h...  (REFER TO PRESCRIPTION NOTES).  . simvastatin (ZOCOR) 20 MG tablet take 1 tablet by mouth ONCE DAILY; PLEASE SEE PROVIDER FOR LIPID PANEL  . SUMAtriptan (IMITREX) 100 MG tablet TAKE 1 TABLET BY MOUTH AT ONSET OF HEADACHE. MAY REPEAT 2 HOURS L...  (REFER TO PRESCRIPTION NOTES).  . tizanidine (ZANAFLEX) 2 MG capsule Take 4 mg by mouth 2 (two) times daily as needed for muscle spasms.  . Topiramate ER (TROKENDI XR) 200 MG CP24 Take 200 mg by mouth daily.  . traZODone (DESYREL) 100 MG tablet Take 1 tablet (100 mg total) by mouth at bedtime. Please discontinue all previous prescriptions.  . traZODone (DESYREL) 50 MG tablet Take 1 tablet (50 mg total) by mouth at bedtime. Take 1 tablet (50mg  total) with 100mg , total 150mg  daily.  . Turmeric Curcumin 500 MG CAPS Take by mouth.  . [DISCONTINUED] hydrochlorothiazide (HYDRODIURIL) 12.5 MG tablet Take by mouth.   No facility-administered encounter medications on file as of 08/20/2016.         Review of Systems: No fevers, chills, night sweats, weight loss, chest pain, or shortness of breath.   Objective:    General: Well Developed, well nourished, and in no acute distress.  Neuro: Alert and oriented x3, extra-ocular muscles intact, sensation grossly intact.  HEENT: Normocephalic, atraumatic  Skin: Warm and dry, no rashes. Cardiac: Regular rate and rhythm, no murmurs rubs or gallops, no lower extremity edema.   Respiratory: Clear to auscultation bilaterally. Not using accessory muscles, speaking in full sentences.   Impression and Recommendations:    Low BP - curvature to let her physician know that her blood pressures have been low especially after increasing her HCTZ to twice a day.  SOB - see note below.   COPD- uncontrolled. That she's actually been much more active  recently. She really wants to stay way from inhaled corticosteroids will try Atrovent as needed. She can use about 30 minutes before exercise activity and see if this is helpful. If not then please call back. She is intolerant to albuterol. Plan to get x-ray if she's not improving. Her symptoms were not sudden. They really seemed to only be exacerbated with exercise I am less suspicious of things like a pulmonary embolism or heart failure. She has no B swelling.  Dizziness-again I think just related to the increased work of breathing with her COPD. Blood pressure is also still low. Make sure hydrating well before exercise as well. Call if not improving over the next couple of weeks.

## 2016-08-23 ENCOUNTER — Telehealth: Payer: Self-pay

## 2016-08-23 NOTE — Telephone Encounter (Signed)
Lets just try the Atovent.

## 2016-08-23 NOTE — Telephone Encounter (Signed)
Notified patient.

## 2016-09-01 DIAGNOSIS — K5900 Constipation, unspecified: Secondary | ICD-10-CM | POA: Diagnosis not present

## 2016-09-01 DIAGNOSIS — R1084 Generalized abdominal pain: Secondary | ICD-10-CM | POA: Diagnosis not present

## 2016-09-03 NOTE — Addendum Note (Signed)
Addended by: Christie Copley, Manuela Schwartz E on: 09/03/2016 09:36 AM   Modules accepted: Orders

## 2016-09-06 ENCOUNTER — Ambulatory Visit (INDEPENDENT_AMBULATORY_CARE_PROVIDER_SITE_OTHER): Payer: BLUE CROSS/BLUE SHIELD | Admitting: Sports Medicine

## 2016-09-06 DIAGNOSIS — G5603 Carpal tunnel syndrome, bilateral upper limbs: Secondary | ICD-10-CM

## 2016-09-06 DIAGNOSIS — M7061 Trochanteric bursitis, right hip: Secondary | ICD-10-CM

## 2016-09-06 DIAGNOSIS — M797 Fibromyalgia: Secondary | ICD-10-CM | POA: Diagnosis not present

## 2016-09-06 MED ORDER — PREGABALIN 25 MG PO CAPS: 25.0000 mg | ORAL_CAPSULE | Freq: Two times a day (BID) | ORAL | 3 refills | Status: DC

## 2016-09-06 NOTE — Assessment & Plan Note (Signed)
Nerve conduction study confirmed carpal tunnel syndrome, bilateral, not improved with median nerve hydrodissection at the last visit. We are going to start with Lyrica, she is now a candidate for surgical release.

## 2016-09-06 NOTE — Progress Notes (Signed)
  Subjective:    CC: Follow-up  HPI: This is a pleasant 55 year old female with fibromyalgia, nerve conduction study confirmed bilateral carpal tunnel syndrome, she had failed nighttime splinting so we proceeded with bilateral median nerve hydrodissection at the last visit, she had no relief, not even temporary.  We also discussed some of her right hip pain, she had fairly classic trochanteric bursitis, also no better with conservative measures.  Fibromyalgia: Currently only taking Cymbalta at 120 mg, intolerant of gabapentin.  Past medical history:  Negative.  See flowsheet/record as well for more information.  Surgical history: Negative.  See flowsheet/record as well for more information.  Family history: Negative.  See flowsheet/record as well for more information.  Social history: Negative.  See flowsheet/record as well for more information.  Allergies, and medications have been entered into the medical record, reviewed, and no changes needed.   Review of Systems: No fevers, chills, night sweats, weight loss, chest pain, or shortness of breath.   Objective:    General: Well Developed, well nourished, and in no acute distress.  Neuro: Alert and oriented x3, extra-ocular muscles intact, sensation grossly intact.  HEENT: Normocephalic, atraumatic, pupils equal round reactive to light, neck supple, no masses, no lymphadenopathy, thyroid nonpalpable.  Skin: Warm and dry, no rashes. Cardiac: Regular rate and rhythm, no murmurs rubs or gallops, no lower extremity edema.  Respiratory: Clear to auscultation bilaterally. Not using accessory muscles, speaking in full sentences.  Impression and Recommendations:    Carpal tunnel syndrome, bilateral Nerve conduction study confirmed carpal tunnel syndrome, bilateral, not improved with median nerve hydrodissection at the last visit. We are going to start with Lyrica, she is now a candidate for surgical release.  Greater trochanteric bursitis,  right Multifactorial but continues to represent trochanteric bursitis. She will work aggressively with rehabilitation exercises, declines formal physical therapy.  Some of this is her fibromyalgia.  Fibromyalgia Continue Cymbalta, adding low-dose Lyrica. Return in one month.  I spent 25 minutes with this patient, greater than 50% was face-to-face time counseling regarding the above diagnoses

## 2016-09-06 NOTE — Assessment & Plan Note (Signed)
Continue Cymbalta, adding low-dose Lyrica. Return in one month.

## 2016-09-06 NOTE — Assessment & Plan Note (Signed)
Multifactorial but continues to represent trochanteric bursitis. She will work aggressively with rehabilitation exercises, declines formal physical therapy.  Some of this is her fibromyalgia.

## 2016-09-07 ENCOUNTER — Ambulatory Visit (HOSPITAL_COMMUNITY): Payer: Self-pay | Admitting: Psychiatry

## 2016-09-08 ENCOUNTER — Encounter (HOSPITAL_COMMUNITY): Payer: Self-pay | Admitting: Psychiatry

## 2016-09-08 ENCOUNTER — Other Ambulatory Visit: Payer: Self-pay | Admitting: Family Medicine

## 2016-09-08 ENCOUNTER — Ambulatory Visit (INDEPENDENT_AMBULATORY_CARE_PROVIDER_SITE_OTHER): Payer: BLUE CROSS/BLUE SHIELD | Admitting: Psychiatry

## 2016-09-08 DIAGNOSIS — F319 Bipolar disorder, unspecified: Secondary | ICD-10-CM | POA: Diagnosis not present

## 2016-09-08 DIAGNOSIS — Z9289 Personal history of other medical treatment: Secondary | ICD-10-CM

## 2016-09-08 DIAGNOSIS — F4310 Post-traumatic stress disorder, unspecified: Secondary | ICD-10-CM

## 2016-09-08 DIAGNOSIS — Z87891 Personal history of nicotine dependence: Secondary | ICD-10-CM

## 2016-09-08 DIAGNOSIS — F5102 Adjustment insomnia: Secondary | ICD-10-CM | POA: Diagnosis not present

## 2016-09-08 DIAGNOSIS — Z9889 Other specified postprocedural states: Secondary | ICD-10-CM

## 2016-09-08 DIAGNOSIS — F411 Generalized anxiety disorder: Secondary | ICD-10-CM | POA: Diagnosis not present

## 2016-09-08 DIAGNOSIS — Z811 Family history of alcohol abuse and dependence: Secondary | ICD-10-CM

## 2016-09-08 MED ORDER — ARIPIPRAZOLE 10 MG PO TABS: 10.0000 mg | ORAL_TABLET | Freq: Every day | ORAL | 1 refills | Status: DC

## 2016-09-08 MED ORDER — LAMOTRIGINE 200 MG PO TABS: 200.0000 mg | ORAL_TABLET | Freq: Every day | ORAL | 1 refills | Status: DC

## 2016-09-08 MED ORDER — TRAZODONE HCL 150 MG PO TABS: 150.0000 mg | ORAL_TABLET | Freq: Every day | ORAL | 1 refills | Status: DC

## 2016-09-08 MED ORDER — MIRTAZAPINE 7.5 MG PO TABS: 7.5000 mg | ORAL_TABLET | Freq: Every day | ORAL | 1 refills | Status: DC

## 2016-09-08 MED ORDER — DULOXETINE HCL 60 MG PO CPEP
120.0000 mg | ORAL_CAPSULE | Freq: Every day | ORAL | 1 refills | Status: DC
Start: 2016-09-08 — End: 2016-11-02

## 2016-09-08 NOTE — Progress Notes (Signed)
Patient ID: Mandy Shaw, female   DOB: Oct 01, 1960, 55 y.o.   MRN: [id number]  Psychiatric Outpatient Follow up visit  [name] MRN:  [id number] Date of Evaluation:  09/08/2016 Referral Source: Self/daughter  Chief Complaint:   Chief Complaint    Follow-up     Visit Diagnosis:    ICD-9-CM ICD-10-CM   1. Bipolar I disorder (HCC) 296.7 F31.9   2. GAD (generalized anxiety disorder) 300.02 F41.1   3. Adjustment insomnia 307.41 F51.02   4. PTSD (post-traumatic stress disorder) 309.81 F43.10    Diagnosis:   Patient Active Problem List   Diagnosis Date Noted  . Fibromyalgia [M79.7] 09/06/2016  . Carpal tunnel syndrome, bilateral [G56.03] 08/09/2016  . Greater trochanteric bursitis, right [M70.61] 08/09/2016  . Trigger finger, left [M65.30] 04/15/2016  . Underweight [R63.6] 10/22/2015  . Malnutrition, calorie ([place]) [E46] 10/22/2015  . Pain in lower limb [M79.606] 09/17/2015  . Microalbuminuria [R80.9] 08/22/2015  . Tobacco abuse [Z72.0] 09/17/2014  . Insomnia [G47.00] 04/09/2014  . 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 Hypothyroidism [E03.9] 07/05/2013  . Herpes [B00.9] 09/05/2012  . Hyperlipidemia [E78.5] 06/05/2010  . Bipolar disorder ([place]) [F31.9] 08/22/2009  . 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 PTSD [F43.10] 08/22/2009  . Migraine headache [G43.909] 08/22/2009  . KNEE PAIN [M25.569] 08/22/2009  . COPD (chronic obstructive pulmonary disease) with chronic bronchitis ([place]) [J44.9] 07/23/2009  . RHEUMATOID ARTHRITIS [M06.9] 07/23/2009   History of Present Illness:  55 years old currently married Caucasian female initially  referred by herself and her daughter she follows with Dr. Ward Chatters upstairs as primary care. Has  been diagnosed bipolar and died a condition, PTSD disability since 63.   PTSD: infrequent flashbacks. On cymbalta Bipolar: on lamictal and last visit started  abilify. Still mind races and has spend over her budget. Recently started on lyrica will wait till its effect  Before increasing abilify . Insomnia: still wakes up frequently on trazadone but 150mg  works somewhat better   Aggravating factors; her medical condition . Husband is blind. Husband is blind since 2012. Multiple etiology factors for depression including history of trauma in the past she also has been diagnosed with borderline personality with suicide attempts in the past Modifying factors; she loves her husband and her support. Her kids. Location: anxiety, depression, insomnia Context: medical health, husband blind, trauma Severity of depression: 6/10. 10 being no depression Duration: more then 20 years    Past Medical History:  Past Medical History:  Diagnosis Date  . Arthritis   . Bipolar 1 disorder ([place])   . COPD (chronic obstructive pulmonary disease) ([place])   . GERD (gastroesophageal reflux disease)   . Insomnia   . Myasthenia gravis ([place])   . 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 Personality disorder   . Thyroid disease     Past Surgical History:  Procedure Laterality Date  . CARDIAC SURGERY     flap on heart removed  . CESAREAN SECTION    . CHOLECYSTECTOMY    . CONDYLOMA EXCISION/FULGURATION    . EYE SURGERY    . INCONTINENCE SURGERY    . INDUCED ABORTION    . LAPAROTOMY     exploratory for pain  . LEEP    . Left hip surgery  01/09/14   Dr. Arnette Norris  . right hip surgery  09/26/2013   Dr. Arnette Norris  . SHOULDER SURGERY Right  2015   Dr. Margretta Sidle  . THYROIDECTOMY     Family History:  Family History  Problem Relation Age of Onset  . Alcohol abuse Father    Social History:   Social History   Social History  . Marital status: Married    Spouse name: N/A  . Number of children: 1  . Years of education: N/A   Occupational History  . disabled    Social History Main Topics  . Smoking status: Former Smoker     Packs/day: 1.50    Years: 40.00    Types: Cigarettes    Quit date: 10/22/2015  . Smokeless tobacco: Never Used  . Alcohol use 0.0 oz/week     Comment: 1-2 drinks yearly   . Drug use: No  . Sexual activity: Yes    Partners: Male     Comment: married, 1 daughter, 1  1/2 yrs college, self-employed, no reg exercise, 3 caffeine drinks daily.   Other Topics Concern  . None   Social History Narrative   Husband is legally blind. Likes spending weekends at [employer] in [place]. Working our regularly.       Psychiatric Specialty Exam: HPI  Review of Systems  Constitutional: Negative for fever.  Cardiovascular: Negative for chest pain.  Musculoskeletal: Positive for myalgias.  Skin: Negative for rash.  Neurological: Negative for tremors.  Psychiatric/Behavioral: Negative for depression and suicidal ideas. The patient has insomnia.     Last menstrual period 06/20/2014.There is no height or weight on file to calculate BMI.  General Appearance: Casual  Eye Contact:  Fair  Speech:  Slow  Volume: normal  Mood: euthymic. Mind racy  Affect:  Congruent  Thought Process:  Coherent  Orientation:  Full (Time, Place, and Person)  Thought Content:  Rumination  Suicidal Thoughts:  No  Homicidal Thoughts:  No  Memory:  Immediate;   Fair Recent;   Fair  Judgement:  Fair  Insight:  Shallow  Psychomotor Activity:  Decreased  Concentration:  Fair  Recall:  [employer] of Knowledge:Fair  Language: Fair  Akathisia:  Negative  Handed:  Right  AIMS (if indicated):    Assets:  Desire for Improvement Social Support  ADL's:  Intact  Cognition: WNL  Sleep:  Poor to variable     Allergies:   Allergies  Allergen Reactions  . Albuterol Anaphylaxis  . Other Anaphylaxis    MANGO, PARSLEY.  [name] Seed Anaphylaxis  . Silica [Nutritional Supplements] Itching  . Macrodantin [Nitrofurantoin Macrocrystal] Itching  . Neurontin [Gabapentin]   . Penicillins     REACTION: Anaphalatic  .  Sulfonamide Derivatives     REACTION: Anaphalatic   Current Medications: Current Outpatient Prescriptions  Medication Sig Dispense Refill  . ARIPiprazole (ABILIFY) 10 MG tablet Take 1 tablet (10 mg total) by mouth daily. 30 tablet 1  . Calcium Carb-Cholecalciferol (CALCIUM 600 + D PO) Take by mouth.    . DOCUSATE CALCIUM PO Take by mouth.    . DULoxetine (CYMBALTA) 60 MG capsule Take 2 capsules (120 mg total) by mouth daily. 60 capsule 1  . famotidine (PEPCID) 20 MG tablet take 1 tablet by mouth daily 30 tablet 6  . hydrochlorothiazide (HYDRODIURIL) 25 MG tablet Take 25 mg by mouth 2 (two) times daily.  1  . ipratropium (ATROVENT HFA) 17 MCG/ACT inhaler Inhale 2 puffs into the lungs every 6 (six) hours as needed for wheezing. 1 Inhaler 2  . KRISTALOSE 10 g packet   0  .  lamoTRIgine (LAMICTAL) 200 MG tablet Take 1 tablet (200 mg total) by mouth daily. One per day. 30 tablet 1  . levothyroxine (SYNTHROID, LEVOTHROID) 125 MCG tablet take 1 tablet by mouth daily 30 tablet 3  . mirtazapine (REMERON) 7.5 MG tablet Take 1 tablet (7.5 mg total) by mouth at bedtime. One at night 30 tablet 1  . Multiple Vitamins-Minerals (CENTRUM SILVER ADULT 50+ PO) Take 1 capsule by mouth daily.    . Multiple Vitamins-Minerals (HAIR SKIN AND NAILS FORMULA PO) Take 1 tablet by mouth daily.    . potassium chloride (K-DUR) 10 MEQ tablet take 1 tablet by mouth once daily    . pregabalin (LYRICA) 25 MG capsule Take 1 capsule (25 mg total) by mouth 2 (two) times daily. 60 capsule 3  . primidone (MYSOLINE) 50 MG tablet   0  . rizatriptan (MAXALT) 10 MG tablet take 1 tablet by mouth AT ONSET OF HEADACHE. may repeat after 2 h...  (REFER TO PRESCRIPTION NOTES).  0  . simvastatin (ZOCOR) 20 MG tablet take 1 tablet by mouth ONCE DAILY; PLEASE SEE PROVIDER FOR LIPID PANEL 90 tablet 3  . tizanidine (ZANAFLEX) 2 MG capsule Take 4 mg by mouth 2 (two) times daily as needed for muscle spasms.    . Topiramate ER (TROKENDI XR) 200 MG  CP24 Take 200 mg by mouth daily.    . traZODone (DESYREL) 150 MG tablet Take 1 tablet (150 mg total) by mouth at bedtime. Please discontinue all previous prescriptions. 30 tablet 1  . Turmeric Curcumin 500 MG CAPS Take by mouth.     No current facility-administered medications for this visit.       Treatment Plan Summary: Medication management and Plan as follows  1. Bipolar: continue lamictal and abilify. Will wait for 2 weeks to get call in regard to lyrica effect on mood before deciding to increase abilify. Prescriptions sent 2. PTSD: continue cymbalta for myalgia, pain and anxiety 3. Insomnia: change trazadone to one tablet of 150mg . Continue remeron   May consider buspirone or vistaril if needed for anxiety and or sleep by next visit Medical complexity: continue close follow up with primary care and providers and their recommendations FU 1-2 months or earlier if needed Provided medical education and reviewed side effects and concerns Knowledge [place] 12/20/20178:29 AM

## 2016-09-09 DIAGNOSIS — M5417 Radiculopathy, lumbosacral region: Secondary | ICD-10-CM | POA: Diagnosis not present

## 2016-09-10 ENCOUNTER — Telehealth: Payer: Self-pay

## 2016-09-10 NOTE — Telephone Encounter (Signed)
Pt left VM stating Lyrica is too expensive and she doesn't qualify for the discount card. Would like to know what else she can get in place of it.

## 2016-09-10 NOTE — Telephone Encounter (Signed)
There should be a card that gives you a free trial offer, have her come pick up one of these.

## 2016-09-14 ENCOUNTER — Ambulatory Visit (INDEPENDENT_AMBULATORY_CARE_PROVIDER_SITE_OTHER): Payer: BLUE CROSS/BLUE SHIELD

## 2016-09-14 DIAGNOSIS — Z1231 Encounter for screening mammogram for malignant neoplasm of breast: Secondary | ICD-10-CM

## 2016-09-14 DIAGNOSIS — Z9289 Personal history of other medical treatment: Secondary | ICD-10-CM

## 2016-09-15 ENCOUNTER — Ambulatory Visit: Payer: Self-pay

## 2016-09-15 NOTE — Telephone Encounter (Signed)
Left VM

## 2016-09-21 ENCOUNTER — Encounter: Payer: Self-pay | Admitting: Family Medicine

## 2016-09-21 ENCOUNTER — Ambulatory Visit (INDEPENDENT_AMBULATORY_CARE_PROVIDER_SITE_OTHER): Payer: BLUE CROSS/BLUE SHIELD | Admitting: Family Medicine

## 2016-09-21 VITALS — BP 101/57 | HR 79 | Ht 64.0 in | Wt 121.2 lb

## 2016-09-21 DIAGNOSIS — E038 Other specified hypothyroidism: Secondary | ICD-10-CM

## 2016-09-21 DIAGNOSIS — R1031 Right lower quadrant pain: Secondary | ICD-10-CM

## 2016-09-21 DIAGNOSIS — R1011 Right upper quadrant pain: Secondary | ICD-10-CM

## 2016-09-21 DIAGNOSIS — R110 Nausea: Secondary | ICD-10-CM | POA: Diagnosis not present

## 2016-09-21 LAB — HEPATIC FUNCTION PANEL
ALT: 40 U/L — AB (ref 7–35)
AST: 47 U/L — AB (ref 13–35)

## 2016-09-21 LAB — CBC AND DIFFERENTIAL
HCT: 42 % (ref 36–46)
HEMOGLOBIN: 14.2 g/dL (ref 12.0–16.0)
PLATELETS: 231 10*3/uL (ref 150–399)
WBC: 7 10*3/mL

## 2016-09-21 LAB — BASIC METABOLIC PANEL
BUN: 31 mg/dL — AB (ref 4–21)
CREATININE: 1.7 mg/dL — AB (ref 0.5–1.1)
Glucose: 83 mg/dL
Potassium: 3.8 mmol/L (ref 3.4–5.3)
Sodium: 136 mmol/L — AB (ref 137–147)

## 2016-09-21 LAB — TSH: TSH: 0.49 u[IU]/mL (ref 0.41–5.90)

## 2016-09-21 NOTE — Progress Notes (Signed)
Subjective:    CC:   HPI:     Hypothyroidism - She has gained some weight recently that she has been going to the gym and she admits she has been eating more but just wants to make sure it's not related to her thyroid. She's currently taking Levothroid) 75 g daily.  Pain in her stomach triggered by eating and drinking.  Pain is sharp and pain 8-10/10.  Can last for about 1 hour, but then will have pain on her right sideBelow the axilla for about 4 hours.  She says sometimes even snacking can trigger it. But doesn't happen every single time. She does feel nauseated with the episodes but no actual vomiting. She says the pain is intermittent. She denies any change in her bowels. No diarrhea or constipation changes though she does have a history of chronic constipation. She denies any blood in the stool. Prior history of cholecystectomy. She had similar right-sided pain years ago and actually had an exploratory laparotomy. They never really found a specific cause.  BP (!) 101/57   Pulse 79   Ht 5\' 4"  (1.626 m)   Wt 121 lb 3.2 oz (55 kg)   LMP 06/20/2014   BMI 20.80 kg/m     Allergies  Allergen Reactions  . Albuterol Anaphylaxis  . Other Anaphylaxis    MANGO, PARSLEY.  [name] Seed Anaphylaxis  . Silica [Nutritional Supplements] Itching  . Macrodantin [Nitrofurantoin Macrocrystal] Itching  . Neurontin [Gabapentin]   . Penicillins     REACTION: Anaphalatic  . Sulfonamide Derivatives     REACTION: Anaphalatic    Past Medical History:  Diagnosis Date  . Arthritis   . Bipolar 1 disorder ([place])   . COPD (chronic obstructive pulmonary disease) ([place])   . GERD (gastroesophageal reflux disease)   . Insomnia   . Myasthenia gravis ([place])   . 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 Personality disorder   . Thyroid disease     Past Surgical History:  Procedure Laterality Date  . CARDIAC SURGERY     flap on heart removed  . CESAREAN SECTION    .  CHOLECYSTECTOMY    . CONDYLOMA EXCISION/FULGURATION    . EYE SURGERY    . INCONTINENCE SURGERY    . INDUCED ABORTION    . LAPAROTOMY     exploratory for pain  . LEEP    . Left hip surgery  01/09/14   Dr. Arnette Norris  . right hip surgery  09/26/2013   Dr. Arnette Norris  . SHOULDER SURGERY Right 2015   Dr. Margretta Sidle  . THYROIDECTOMY      Social History   Social History  . Marital status: Married    Spouse name: N/A  . Number of children: 1  . Years of education: N/A   Occupational History  . disabled    Social History Main Topics  . Smoking status: Former Smoker    Packs/day: 1.50    Years: 40.00    Types: Cigarettes    Quit date: 10/22/2015  . Smokeless tobacco: Never Used  . Alcohol use 0.0 oz/week     Comment: 1-2 drinks yearly   . Drug use: No  . Sexual activity: Yes    Partners: Male     Comment: married, 1 daughter, 1  1/2 yrs college, self-employed, no reg exercise, 3 caffeine drinks daily.   Other Topics Concern  . Not on file   Social History Narrative  Husband is legally blind. Likes spending weekends at [employer] in [place]. Working our regularly.     Family History  Problem Relation Age of Onset  . Alcohol abuse Father     Outpatient Encounter Prescriptions as of 09/21/2016  Medication Sig  . ARIPiprazole (ABILIFY) 10 MG tablet Take 1 tablet (10 mg total) by mouth daily.  Marland Kitchen azaTHIOprine (IMURAN) 50 MG tablet Take 50 mg by mouth 2 (two) times daily.  . Calcium Carb-Cholecalciferol (CALCIUM 600 + D PO) Take by mouth.  . DOCUSATE CALCIUM PO Take by mouth.  . DULoxetine (CYMBALTA) 60 MG capsule Take 2 capsules (120 mg total) by mouth daily.  . famotidine (PEPCID) 20 MG tablet take 1 tablet by mouth daily  . hydrochlorothiazide (HYDRODIURIL) 25 MG tablet Take 25 mg by mouth 2 (two) times daily.  Marland Kitchen ipratropium (ATROVENT HFA) 17 MCG/ACT inhaler Inhale 2 puffs into the lungs every 6 (six) hours as needed for wheezing.  Marland Kitchen KRISTALOSE 10 g packet   .  lamoTRIgine (LAMICTAL) 200 MG tablet Take 1 tablet (200 mg total) by mouth daily. One per day.  . levothyroxine (SYNTHROID, LEVOTHROID) 125 MCG tablet take 1 tablet by mouth daily  . mirtazapine (REMERON) 7.5 MG tablet Take 1 tablet (7.5 mg total) by mouth at bedtime. One at night  . Multiple Vitamins-Minerals (CENTRUM SILVER ADULT 50+ PO) Take 1 capsule by mouth daily.  . Multiple Vitamins-Minerals (HAIR SKIN AND NAILS FORMULA PO) Take 1 tablet by mouth daily.  . potassium chloride (K-DUR) 10 MEQ tablet take 1 tablet by mouth once daily  . pregabalin (LYRICA) 25 MG capsule Take 1 capsule (25 mg total) by mouth 2 (two) times daily.  . primidone (MYSOLINE) 50 MG tablet   . rizatriptan (MAXALT) 10 MG tablet take 1 tablet by mouth AT ONSET OF HEADACHE. may repeat after 2 h...  (REFER TO PRESCRIPTION NOTES).  . simvastatin (ZOCOR) 20 MG tablet take 1 tablet by mouth ONCE DAILY; PLEASE SEE PROVIDER FOR LIPID PANEL  . tizanidine (ZANAFLEX) 2 MG capsule Take 4 mg by mouth 2 (two) times daily as needed for muscle spasms.  . Topiramate ER (TROKENDI XR) 200 MG CP24 Take 200 mg by mouth daily.  . traZODone (DESYREL) 150 MG tablet Take 1 tablet (150 mg total) by mouth at bedtime. Please discontinue all previous prescriptions.  . Turmeric Curcumin 500 MG CAPS Take by mouth.   No facility-administered encounter medications on file as of 09/21/2016.        Review of Systems: No fevers, chills, night sweats, weight loss, chest pain, or shortness of breath.   Objective:    General: Well Developed, well nourished, and in no acute distress.  Neuro: Alert and oriented x3, extra-ocular muscles intact, sensation grossly intact.  HEENT: Normocephalic, atraumatic  Skin: Warm and dry, no rashes. Cardiac: Regular rate and rhythm, no murmurs rubs or gallops, no lower extremity edema.  Respiratory: Clear to auscultation bilaterally. Not using accessory muscles, speaking in full sentences. Abd: Soft, normal bowel  sounds. Very tender in the right lower quadrant and right upper quadrant. Also tender on the right side just between the lower rib in the hip. No rebound. No guarding.   Impression and Recommendations:   Hypothyroidism - Will recheck TSH. Will adjust if needed. If normal then likely the increased muscle weight from working out is probably the likely cause of her weight gain.  Abd Pain - will schedule with CT for contrast abdomen and pelvis for further evaluation.We'll  check CBC as well. She's had a prior history of cholecystectomy. All with results once available. We will try to get her scheduled for today.

## 2016-09-22 ENCOUNTER — Other Ambulatory Visit (HOSPITAL_BASED_OUTPATIENT_CLINIC_OR_DEPARTMENT_OTHER): Payer: Self-pay

## 2016-09-22 ENCOUNTER — Telehealth: Payer: Self-pay | Admitting: Family Medicine

## 2016-09-22 NOTE — Telephone Encounter (Signed)
Pt sees Dr. Ruthann Cancer and her last visit was 08/05/16.(see OV in epic) she did have labs done at that time. She is scheduled to have her CT tomorrow at 8AM.   Pt wanted to know about the results of her tsh. Is she to continue taking 125 mcg of the levothyroxine? The results were not included in the phone note. Please advise.Mandy Shaw

## 2016-09-22 NOTE — Telephone Encounter (Signed)
Call patient: I did get her lab results. Kidney function is still quite elevated creatinine at 1.7. Please see when the last time she saw her nephrologist. I believe she sees Dr. Welton Flakes. We may need to call and get the last office visit note if she has been there within the last year. Her enzymes are still just slightly elevated. Do believe her CT has been scheduled. Beatrice Lecher, MD

## 2016-09-23 ENCOUNTER — Encounter: Payer: Self-pay | Admitting: Family Medicine

## 2016-09-23 ENCOUNTER — Ambulatory Visit (HOSPITAL_BASED_OUTPATIENT_CLINIC_OR_DEPARTMENT_OTHER)
Admission: RE | Admit: 2016-09-23 | Discharge: 2016-09-23 | Disposition: A | Discharge status: Home / Self Care | Payer: BLUE CROSS/BLUE SHIELD | Source: Ambulatory Visit | Attending: Family Medicine | Admitting: Family Medicine

## 2016-09-23 DIAGNOSIS — R1011 Right upper quadrant pain: Secondary | ICD-10-CM | POA: Insufficient documentation

## 2016-09-23 DIAGNOSIS — I700 Atherosclerosis of aorta: Secondary | ICD-10-CM | POA: Insufficient documentation

## 2016-09-23 DIAGNOSIS — R110 Nausea: Secondary | ICD-10-CM | POA: Insufficient documentation

## 2016-09-23 DIAGNOSIS — R1031 Right lower quadrant pain: Secondary | ICD-10-CM | POA: Insufficient documentation

## 2016-09-23 NOTE — Telephone Encounter (Signed)
Thyroid is stable. Continue current regimen and we will recheck in 3-4 months.

## 2016-09-24 NOTE — Telephone Encounter (Signed)
Patient advised of results and recommendations.  

## 2016-10-04 ENCOUNTER — Encounter: Payer: Self-pay | Admitting: Sports Medicine

## 2016-10-04 ENCOUNTER — Encounter: Payer: Self-pay | Admitting: Podiatry

## 2016-10-04 ENCOUNTER — Ambulatory Visit (INDEPENDENT_AMBULATORY_CARE_PROVIDER_SITE_OTHER): Payer: Medicare Other | Admitting: Podiatry

## 2016-10-04 ENCOUNTER — Ambulatory Visit (INDEPENDENT_AMBULATORY_CARE_PROVIDER_SITE_OTHER): Payer: BLUE CROSS/BLUE SHIELD | Admitting: Sports Medicine

## 2016-10-04 ENCOUNTER — Telehealth (HOSPITAL_COMMUNITY): Payer: Self-pay | Admitting: Psychiatry

## 2016-10-04 ENCOUNTER — Ambulatory Visit: Payer: BLUE CROSS/BLUE SHIELD

## 2016-10-04 VITALS — BP 101/65 | HR 87 | Temp 97.8°F | Resp 18 | Wt 125.8 lb

## 2016-10-04 DIAGNOSIS — R300 Dysuria: Secondary | ICD-10-CM | POA: Diagnosis not present

## 2016-10-04 DIAGNOSIS — M79606 Pain in leg, unspecified: Secondary | ICD-10-CM

## 2016-10-04 DIAGNOSIS — Q828 Other specified congenital malformations of skin: Secondary | ICD-10-CM | POA: Diagnosis not present

## 2016-10-04 DIAGNOSIS — G5603 Carpal tunnel syndrome, bilateral upper limbs: Secondary | ICD-10-CM

## 2016-10-04 DIAGNOSIS — L600 Ingrowing nail: Secondary | ICD-10-CM

## 2016-10-04 DIAGNOSIS — G609 Hereditary and idiopathic neuropathy, unspecified: Secondary | ICD-10-CM | POA: Diagnosis not present

## 2016-10-04 DIAGNOSIS — M7061 Trochanteric bursitis, right hip: Secondary | ICD-10-CM

## 2016-10-04 DIAGNOSIS — B351 Tinea unguium: Secondary | ICD-10-CM

## 2016-10-04 LAB — POCT URINALYSIS DIPSTICK
Bilirubin, UA: NEGATIVE
Glucose, UA: NEGATIVE
Ketones, UA: NEGATIVE
Nitrite, UA: NEGATIVE
Protein, UA: NEGATIVE
Spec Grav, UA: <=1.005
Urobilinogen, UA: 0.2
pH, UA: 5

## 2016-10-04 MED ORDER — CIPROFLOXACIN HCL 500 MG PO TABS: 500.0000 mg | ORAL_TABLET | Freq: Two times a day (BID) | ORAL | 0 refills | Status: AC

## 2016-10-04 NOTE — Telephone Encounter (Signed)
Pt was told to call and report about the lyrica rx that another doc put her on.  She states it is making her sleepy and it is not really helping with her diagnosis. De Nurse mentioned about uping her abilify to help. She would like to know if he wants to go ahead and up the abilify or any other suggestions, or does pt need an appt.   Please advise

## 2016-10-04 NOTE — Addendum Note (Signed)
Addended by: Narda Rutherford on: 10/04/2016 04:15 PM   Modules accepted: Orders

## 2016-10-04 NOTE — Progress Notes (Signed)
  Subjective:    CC: Follow-up  HPI: Carpal tunnel syndrome: Nerve conduction/EMG confirmed bilateral, has failed injections, splinting, Lyrica, NSAIDs.  Bilateral hip pain: Known bilateral trochanteric bursitis, history of myasthenia gravis and this makes it difficult for her to do her rehabilitation exercises, pain is moderate, persistent, localized over the greater trochanter bilaterally without radiation.  Past medical history:  Negative.  See flowsheet/record as well for more information.  Surgical history: Negative.  See flowsheet/record as well for more information.  Family history: Negative.  See flowsheet/record as well for more information.  Social history: Negative.  See flowsheet/record as well for more information.  Allergies, and medications have been entered into the medical record, reviewed, and no changes needed.   Review of Systems: No fevers, chills, night sweats, weight loss, chest pain, or shortness of breath.   Objective:    General: Well Developed, well nourished, and in no acute distress.  Neuro: Alert and oriented x3, extra-ocular muscles intact, sensation grossly intact.  HEENT: Normocephalic, atraumatic, pupils equal round reactive to light, neck supple, no masses, no lymphadenopathy, thyroid nonpalpable.  Skin: Warm and dry, no rashes. Cardiac: Regular rate and rhythm, no murmurs rubs or gallops, no lower extremity edema.  Respiratory: Clear to auscultation bilaterally. Not using accessory muscles, speaking in full sentences.  Procedure: Real-time Ultrasound Guided Injection of left greater trochanteric bursa Device: GE Logiq E  Verbal informed consent obtained.  Time-out conducted.  Noted no overlying erythema, induration, or other signs of local infection.  Skin prepped in a sterile fashion.  Local anesthesia: Topical Ethyl chloride.  With sterile technique and under real time ultrasound guidance:  Advanced towards the greater trochanter, I then injected  1 mL kenalog 40, 2 mL lidocaine, 2 mL Marcaine. Completed without difficulty  Pain immediately resolved suggesting accurate placement of the medication.  Advised to call if fevers/chills, erythema, induration, drainage, or persistent bleeding.  Images permanently stored and available for review in the ultrasound unit.  Impression: Technically successful ultrasound guided injection.  Procedure: Real-time Ultrasound Guided Injection of right greater trochanteric bursa Device: GE Logiq E  Verbal informed consent obtained.  Time-out conducted.  Noted no overlying erythema, induration, or other signs of local infection.  Skin prepped in a sterile fashion.  Local anesthesia: Topical Ethyl chloride.  With sterile technique and under real time ultrasound guidance:  Advanced towards the greater trochanter, I then injected 1 mL kenalog 40, 2 mL lidocaine, 2 mL Marcaine. Completed without difficulty  Pain immediately resolved suggesting accurate placement of the medication.  Advised to call if fevers/chills, erythema, induration, drainage, or persistent bleeding.  Images permanently stored and available for review in the ultrasound unit.  Impression: Technically successful ultrasound guided injection.  Impression and Recommendations:    Carpal tunnel syndrome, bilateral Nerve conduction/EMG confirmed bilateral carpal tunnel syndrome, these were done at [facility]. Has failed bilateral median nerve injection/hydrodissection, Lyrica, gabapentin, nighttime splinting. Referral to hand surgery.  Greater trochanteric bursitis, right Bilateral and still weak. Some of this is certainly from fibromyalgia. Bilateral injections, she will add a 5 pound ankle weight to her hip exercises. Return in one month.

## 2016-10-04 NOTE — Assessment & Plan Note (Signed)
Nerve conduction/EMG confirmed bilateral carpal tunnel syndrome, these were done at [facility]. Has failed bilateral median nerve injection/hydrodissection, Lyrica, gabapentin, nighttime splinting. Referral to hand surgery.

## 2016-10-04 NOTE — Progress Notes (Signed)
Patient also complained of one day of urinary urgency and burning. No fevers chills or sweats. She has had some flank pain as well. Afebrile. Urinalysis showed positive leukocytes and blood. Will treat with ciprofloxacin based on allergy/intolerances. Call if not better in 3 days.  Beatrice Lecher, MD

## 2016-10-04 NOTE — Patient Instructions (Addendum)
Custom orthotics dispensed with instructions. Pain from ingrown nails and calluses. All nails and calluses debrided. Return in one month for follow up.

## 2016-10-04 NOTE — Assessment & Plan Note (Signed)
Bilateral and still weak. Some of this is certainly from fibromyalgia. Bilateral injections, she will add a 5 pound ankle weight to her hip exercises. Return in one month.

## 2016-10-04 NOTE — Addendum Note (Signed)
Addended by: Beatrice Lecher D on: 10/04/2016 03:37 PM   Modules accepted: Orders

## 2016-10-04 NOTE — Progress Notes (Signed)
SUBJECTIVE: 56 y.o. year old female presents to pick up orthotics.  Orthotics dispensed with instructions.  Patient is complaining of pain on both great toe nails on both borders from deformed ingrown nails.   Co existing condition: Myasthenia Gravis, Fibromyalgia.  OBJECTIVE: DERMATOLOGIC EXAMINATION: Deformed painful hallucal nails bilateral. Recurring callus under 5th MPJ right. VASCULAR EXAMINATION OF LOWER LIMBS: All pedal pulses are faintly palpable. Capillary Filling times within 3 seconds in all digits.  Temperature gradient from tibial crest to dorsum of foot is within normal bilateral. NEUROLOGIC EXAMINATION OF THE LOWER LIMBS: No new changes. Subjective burning pain at dorsum of both feet, gets worse after prolonged ambulation.  Pain at rest in bed or sitting in chair. Some times pain is when the legs are crossed.  Has had failed to respond to Monofilament sensory testing with normal vibratory sensory.  MUSCULOSKELETAL EXAMINATION: High arched cavus type foot bilateral. Plantar flexed 5th metatarsal right.  ASSESSMENT: 1. 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  2. Symptomatic ingrown nails both great toes due to residual nail growth following nail ingrown nail surgery. 3. Peripheral neuropathy. 4. Pain in lower limb.  5. Plantar keratosis under 5th MPJ right foot, symptomatic.  PLAN: Plantar calluses debrided. Ingrown hallucal nails debrided. Custom orthotics dispensed with instructions.

## 2016-10-05 ENCOUNTER — Telehealth: Payer: Self-pay

## 2016-10-05 NOTE — Telephone Encounter (Signed)
It has only been one day, give it time. Ice it for 20 minutes 3-4 times per day, by the time she gets this message will probably be resolved.

## 2016-10-05 NOTE — Telephone Encounter (Signed)
Pt left VM stating that she can barely walk due to right hip pain after injection. Would like to know what to do next. Please advise.

## 2016-10-05 NOTE — Telephone Encounter (Signed)
Pt called and would like to know the status of this message. Pt has not heard anything yet and would like a answer today.   Informed pt I would call her by the end of the day today.

## 2016-10-07 LAB — URINE CULTURE

## 2016-10-08 MED ORDER — ARIPIPRAZOLE 10 MG PO TABS: 15.0000 mg | ORAL_TABLET | Freq: Every day | ORAL | 0 refills | Status: DC

## 2016-10-08 NOTE — Telephone Encounter (Signed)
Return telephone call to pt. Pt states she stopped Lyrica on 10/02/16 because the medication was making her sleepy and it was not helping with diagnosis. Pt would like to increase Abilify. Per Dr. De Nurse, pt may increase Abilify to 15mg  qd. A new prescription will be sent to [facility] for Abilify 15mg , #45. Pt's next apt is schedule on 11/02/16. Pt's verbalizes understanding.

## 2016-10-08 NOTE — Telephone Encounter (Signed)
Spoke with pt states she's feeling much better now.

## 2016-10-08 NOTE — Telephone Encounter (Signed)
Can increase abilify to 15mg  qd. Use one and one half of 10mg . Can send new prescription of 15mg  Also lower the lyrica dose if feeling sleepy and see its effect if want to continue

## 2016-10-12 ENCOUNTER — Ambulatory Visit (INDEPENDENT_AMBULATORY_CARE_PROVIDER_SITE_OTHER): Payer: Medicare Other | Admitting: Podiatry

## 2016-10-12 ENCOUNTER — Encounter: Payer: Self-pay | Admitting: Podiatry

## 2016-10-12 DIAGNOSIS — M79606 Pain in leg, unspecified: Secondary | ICD-10-CM

## 2016-10-12 NOTE — Patient Instructions (Addendum)
Pain from orthotics. Lateral posting placed. Made them feel better. Will place permanent posting next time. 0   + [phone]  [id number]                                                                                                                                                                                                                                                                                      Orthotic adjusted with lateral posting. May require corrective permanent posting.

## 2016-10-12 NOTE — Progress Notes (Signed)
Patient brought back orthotics complaining of instep support is more towards heel and left foot support is too high. Left instep hurts. Right support feels not in right place, it feels like more on heel area.  Lateral posting placed with 1/8" felt pad under lateral heel and under 5th ray bilateral. Patient felt improvement. Will add permanent posting next time.

## 2016-10-14 ENCOUNTER — Telehealth (HOSPITAL_COMMUNITY): Payer: Self-pay | Admitting: *Deleted

## 2016-10-14 NOTE — Telephone Encounter (Signed)
Prior auth received for Aripiprazole. Prior auth faxed to [facility].

## 2016-10-15 NOTE — Telephone Encounter (Signed)
Prior authorization for Abilify received. Submitted online with cover my meds. Awaiting response to be faxed to office.

## 2016-10-17 ENCOUNTER — Emergency Department (INDEPENDENT_AMBULATORY_CARE_PROVIDER_SITE_OTHER)
Admission: EM | Admit: 2016-10-17 | Discharge: 2016-10-17 | Disposition: A | Discharge status: Home / Self Care | Payer: BLUE CROSS/BLUE SHIELD | Source: Home / Self Care | Attending: Family Medicine | Admitting: Family Medicine

## 2016-10-17 DIAGNOSIS — R350 Frequency of micturition: Secondary | ICD-10-CM

## 2016-10-17 DIAGNOSIS — R109 Unspecified abdominal pain: Secondary | ICD-10-CM

## 2016-10-17 DIAGNOSIS — R300 Dysuria: Secondary | ICD-10-CM | POA: Diagnosis not present

## 2016-10-17 DIAGNOSIS — S29011A Strain of muscle and tendon of front wall of thorax, initial encounter: Secondary | ICD-10-CM

## 2016-10-17 LAB — POCT URINALYSIS DIP (MANUAL ENTRY)
Bilirubin, UA: NEGATIVE
Glucose, UA: NEGATIVE
Ketones, POC UA: NEGATIVE
Nitrite, UA: NEGATIVE
Protein Ur, POC: NEGATIVE
Spec Grav, UA: 1.01 (ref 1.005–1.03)
Urobilinogen, UA: 0.2 (ref 0–1)
pH, UA: 7 (ref 5–8)

## 2016-10-17 MED ORDER — CAPSAICIN 0.075 % EX CREA: 1.0000 "application " | TOPICAL_CREAM | Freq: Two times a day (BID) | CUTANEOUS | 0 refills | Status: DC

## 2016-10-17 MED ORDER — LEVOFLOXACIN 250 MG PO TABS: ORAL_TABLET | ORAL | 0 refills | Status: DC

## 2016-10-17 NOTE — ED Triage Notes (Signed)
Patient states she was previously diagnosed with a UTI a few weeks ago she did finish her Cipro approx a week ago. States the UTI symptoms have returned but worse. Also complains of some right shoulder pain that radiates to her back.

## 2016-10-17 NOTE — ED Provider Notes (Signed)
CSN: [id number]     Arrival date & time 10/17/16  1238 History   First MD Initiated Contact with Patient 10/17/16 1318     Chief Complaint  Patient presents with  . Dysuria   (Consider location/radiation/quality/duration/timing/severity/associated sxs/prior Treatment) HPI Mandy Shaw is a 56 y.o. female presenting to UC with c/o sudden onset urinary frequency, urgency and severe burning with urination last night. Associated Right flank pain. She reports being treated with Cipro a week ago for a UTI, symptoms seemed to resolve but then came back worse than before.  Denies n/v/d.  She doe have a hx of anaphylaxis with PCN, Sulfa drugs, and nitrofurantoin.    Pt also c/o an unrelated intermittent sharp stabbing pain in Right upper chest/right shoulder for 2-3 weeks. Pt believes she strained a muscle but cannot recall any specific injuries.  Pain is 7/10 at times, worse with palpation. Denies heavy lifting or falls. She reports having stage 3 renal failure and cannot take anything for pain besides her medication for fibromyalgia and migraine medications.    Past Medical History:  Diagnosis Date  . Arthritis   . Bipolar 1 disorder ([place])   . COPD (chronic obstructive pulmonary disease) ([place])   . GERD (gastroesophageal reflux disease)   . Insomnia   . Myasthenia gravis ([place])   . 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 Personality disorder   . Thyroid disease    Past Surgical History:  Procedure Laterality Date  . CARDIAC SURGERY     flap on heart removed  . CESAREAN SECTION    . CHOLECYSTECTOMY    . CONDYLOMA EXCISION/FULGURATION    . EYE SURGERY    . INCONTINENCE SURGERY    . INDUCED ABORTION    . LAPAROTOMY     exploratory for pain  . LEEP    . Left hip surgery  01/09/14   Dr. Arnette Norris  . right hip surgery  09/26/2013   Dr. Arnette Norris  . SHOULDER SURGERY Right 2015   Dr. Margretta Sidle  . THYROIDECTOMY     Family History  Problem Relation Age of  Onset  . Alcohol abuse Father    Social History  Substance Use Topics  . Smoking status: Former Smoker    Packs/day: 1.50    Years: 40.00    Types: Cigarettes    Quit date: 10/22/2015  . Smokeless tobacco: Never Used  . Alcohol use 0.0 oz/week     Comment: 1-2 drinks yearly    OB History    Gravida Para Term Preterm AB Living   2 0   0 1 1   SAB TAB Ectopic Multiple Live Births     1           Review of Systems  Constitutional: Negative for chills and fever.  Respiratory: Negative for cough, chest tightness and shortness of breath.   Cardiovascular: Positive for chest pain (Right upper). Negative for palpitations.  Gastrointestinal: Negative for abdominal pain, diarrhea, nausea and vomiting.  Genitourinary: Positive for dysuria, flank pain (Right), frequency and urgency. Negative for decreased urine volume, hematuria and pelvic pain.  Musculoskeletal: Positive for myalgias. Negative for back pain.  Skin: Negative for color change and rash.  Neurological: Negative for weakness and numbness.    Allergies  Albuterol; Other; Parsley seed; Silica [nutritional supplements]; Macrodantin [nitrofurantoin macrocrystal]; Neurontin [gabapentin]; Penicillins; and Sulfonamide derivatives  Home Medications   Prior to Admission medications   Medication Sig Start Date End  Date Taking? Authorizing Provider  ARIPiprazole (ABILIFY) 10 MG tablet Take 1.5 tablets (15 mg total) by mouth daily. 10/08/16 10/08/17 Yes Merian Capron, MD  azaTHIOprine (IMURAN) 50 MG tablet Take 50 mg by mouth 2 (two) times daily.   Yes Historical Provider, MD  Calcium Carb-Cholecalciferol (CALCIUM 600 + D PO) Take by mouth.   Yes Historical Provider, MD  DOCUSATE CALCIUM PO Take by mouth.   Yes Historical Provider, MD  DULoxetine (CYMBALTA) 60 MG capsule Take 2 capsules (120 mg total) by mouth daily. 09/08/16  Yes Merian Capron, MD  famotidine (PEPCID) 20 MG tablet take 1 tablet by mouth daily 08/10/16  Yes Hali Marry, MD  hydrochlorothiazide (HYDRODIURIL) 25 MG tablet Take 25 mg by mouth 2 (two) times daily. 08/18/16  Yes Historical Provider, MD  ipratropium (ATROVENT HFA) 17 MCG/ACT inhaler Inhale 2 puffs into the lungs every 6 (six) hours as needed for wheezing. 08/20/16  Yes Hali Marry, MD  KRISTALOSE 10 g packet  08/25/16  Yes Historical Provider, MD  lamoTRIgine (LAMICTAL) 200 MG tablet Take 1 tablet (200 mg total) by mouth daily. One per day. 09/08/16  Yes Merian Capron, MD  levothyroxine (SYNTHROID, LEVOTHROID) 125 MCG tablet take 1 tablet by mouth daily 07/20/16  Yes Hali Marry, MD  mirtazapine (REMERON) 7.5 MG tablet Take 1 tablet (7.5 mg total) by mouth at bedtime. One at night 09/08/16  Yes Merian Capron, MD  Multiple Vitamins-Minerals (CENTRUM SILVER ADULT 50+ PO) Take 1 capsule by mouth daily.   Yes Historical Provider, MD  Multiple Vitamins-Minerals (HAIR SKIN AND NAILS FORMULA PO) Take 1 tablet by mouth daily.   Yes Historical Provider, MD  potassium chloride (K-DUR) 10 MEQ tablet take 1 tablet by mouth once daily 08/31/16  Yes Historical Provider, MD  primidone (MYSOLINE) 50 MG tablet  04/30/16  Yes Historical Provider, MD  rizatriptan (MAXALT) 10 MG tablet take 1 tablet by mouth AT ONSET OF HEADACHE. may repeat after 2 h...  (REFER TO PRESCRIPTION NOTES). 07/21/16  Yes Historical Provider, MD  simvastatin (ZOCOR) 20 MG tablet take 1 tablet by mouth ONCE DAILY; PLEASE SEE PROVIDER FOR LIPID PANEL 06/10/16  Yes Hali Marry, MD  tizanidine (ZANAFLEX) 2 MG capsule Take 4 mg by mouth 2 (two) times daily as needed for muscle spasms.   Yes Historical Provider, MD  Topiramate ER (TROKENDI XR) 200 MG CP24 Take 200 mg by mouth daily.   Yes Historical Provider, MD  traZODone (DESYREL) 150 MG tablet Take 1 tablet (150 mg total) by mouth at bedtime. Please discontinue all previous prescriptions. 09/08/16 09/08/17 Yes Merian Capron, MD  Turmeric Curcumin 500 MG CAPS Take by  mouth.   Yes Historical Provider, MD  capsicum (ZOSTRIX) 0.075 % topical cream Apply 1 application topically 2 (two) times daily. 10/17/16   Noland Fordyce, PA-C  levofloxacin (LEVAQUIN) 250 MG tablet Day 1 take 2 tabs (500mg ) once, Day 2-7: take 1 tab (250mg ) 10/17/16   Noland Fordyce, PA-C   Meds Ordered and Administered this Visit  Medications - No data to display  BP 103/70 (BP Location: Left Arm)   Pulse 87   Temp 98 F (36.7 C) (Oral)   Ht 5\' 4"  (1.626 m)   Wt 125 lb (56.7 kg) Comment: declined to weigh  LMP 06/20/2014   SpO2 98%   BMI 21.46 kg/m  No data found.   Physical Exam  Constitutional: She is oriented to person, place, and time. She appears well-developed and well-nourished.  No distress.  HENT:  Head: Normocephalic and atraumatic.  Mouth/Throat: Oropharynx is clear and moist.  Eyes: EOM are normal.  Neck: Normal range of motion. Neck supple.  Cardiovascular: Normal rate and regular rhythm.   Pulmonary/Chest: Effort normal and breath sounds normal. No respiratory distress. She has no wheezes. She has no rales. She exhibits tenderness.    Abdominal: Soft. She exhibits no distension and no mass. There is no tenderness. There is CVA tenderness (Right side). There is no rebound and no guarding.  Musculoskeletal: Normal range of motion.  Right shoulder: no bony tenderness. Tenderness to muscle below clavicle in Right upper chest wall. Full ROM Right arm with 5/5 strength.  Neurological: She is alert and oriented to person, place, and time.  Skin: Skin is warm and dry. Capillary refill takes less than 2 seconds. No rash noted. She is not diaphoretic. No erythema.  Psychiatric: She has a normal mood and affect. Her behavior is normal.  Nursing note and vitals reviewed.   Urgent Care Course     Procedures (including critical care time)  Labs Review Labs Reviewed  POCT URINALYSIS DIP (MANUAL ENTRY) - Abnormal; Notable for the following:       Result Value   Clarity,  UA cloudy (*)    Blood, UA trace-intact (*)    Leukocytes, UA large (3+) (*)    All other components within normal limits  URINE CULTURE    Imaging Review No results found.    MDM   1. Dysuria   2. Urinary frequency   3. Right flank pain   4. Strain of right pectoralis muscle, initial encounter    Pt c/o Right flank pain and sudden onset moderate to severe urinary symptoms since last night. Recent treatment with 3 days of Cipro.  Consulted with Dr. Burnett Harry, due to anaphylactic reaction to multiple antibiotics, will try Levaquin for 7 days based off last urine culture. Will also send a new urine culture today  Right upper chest pain, reproduced with palpation. Pain most likely muscular. Doubt ACS. Rx: capsicin cream   F/u with PCP next week for recheck of symptoms.    Noland Fordyce, PA-C 10/17/16 1441

## 2016-10-17 NOTE — Discharge Instructions (Signed)
°  You may apply the capsicum cream to Right upper chest soreness twice daily as needed. Be sure to wash hands with soap and water thoroughly after applying to help prevent you from accidentally spreading cream to your face or eyes as this can cause severe burning to the eyes.

## 2016-10-18 LAB — URINE CULTURE

## 2016-10-18 NOTE — Telephone Encounter (Addendum)
Prior auth approval for Abilify received.ref #[id number] , Approved 10/15/16-01/12/17. Pharmacy notified.

## 2016-10-19 ENCOUNTER — Telehealth: Payer: Self-pay | Admitting: *Deleted

## 2016-10-19 DIAGNOSIS — M25532 Pain in left wrist: Secondary | ICD-10-CM | POA: Diagnosis not present

## 2016-10-19 DIAGNOSIS — G5603 Carpal tunnel syndrome, bilateral upper limbs: Secondary | ICD-10-CM | POA: Diagnosis not present

## 2016-10-19 DIAGNOSIS — M25531 Pain in right wrist: Secondary | ICD-10-CM | POA: Diagnosis not present

## 2016-10-19 NOTE — Telephone Encounter (Signed)
LM with Ucx results, complete ABT and call back if she has any questions or concerns.  

## 2016-10-21 ENCOUNTER — Telehealth: Payer: Self-pay | Admitting: *Deleted

## 2016-10-21 NOTE — Telephone Encounter (Signed)
Patient returned call. She is still experiencing urinary urgency and frequency. She is afebrile, denies flank pain or hematuria. Taking Levaquin as prescribed. Per Noland Fordyce, PA-C she should follow with PCP or urology given her extensive allergy list there is nothing else to place her on.

## 2016-10-21 NOTE — Telephone Encounter (Signed)
Called patient back in response to a message that she left on our VM yesterday AM. She reported her symptoms were not improving. Encouraged to call back or follow up with PCP.

## 2016-10-21 NOTE — Telephone Encounter (Signed)
Pt lvm stating that she was seen in UC on 10/17/16 for UTI. She was treated with levaquin x 7 days. She stated that she finished the abx today and she called them and informed them that she is not any better. They advised her that since she has an extensive allergy list there is nothing else to give her and that she should contact her pcp. She wanted to know if something else can be sent.  Will fwd to pcp for advice.[name] [place]

## 2016-10-22 NOTE — Telephone Encounter (Signed)
Yes, needs to go if having sxs.

## 2016-10-22 NOTE — Telephone Encounter (Signed)
She can come back in for urine culture to confirm the bacteria and make sure not cleared.

## 2016-10-22 NOTE — Telephone Encounter (Signed)
Called pt and informed her of recommendations. She stated that when they ran the culture it said that it was unremarkable and she wanted to know if she still need to come in to do another urine.   Will fwd to pcp.Mandy Shaw, [place]

## 2016-10-22 NOTE — Telephone Encounter (Signed)
Pt informed of recommendations. She stated that she has an appointment with Dr. Ruthann Cancer on Monday. I advised that she could have their office to collect a urine and fwd the results. She stated that she would do that.Mandy Shaw [place]

## 2016-10-25 DIAGNOSIS — Z1231 Encounter for screening mammogram for malignant neoplasm of breast: Secondary | ICD-10-CM | POA: Diagnosis not present

## 2016-10-25 DIAGNOSIS — N183 Chronic kidney disease, stage 3 (moderate): Secondary | ICD-10-CM | POA: Diagnosis not present

## 2016-10-25 DIAGNOSIS — N251 Nephrogenic diabetes insipidus: Secondary | ICD-10-CM | POA: Diagnosis not present

## 2016-10-25 DIAGNOSIS — R801 Persistent proteinuria, unspecified: Secondary | ICD-10-CM | POA: Diagnosis not present

## 2016-11-01 ENCOUNTER — Ambulatory Visit: Payer: BLUE CROSS/BLUE SHIELD

## 2016-11-01 ENCOUNTER — Encounter: Payer: Self-pay | Admitting: Sports Medicine

## 2016-11-01 ENCOUNTER — Ambulatory Visit (INDEPENDENT_AMBULATORY_CARE_PROVIDER_SITE_OTHER): Payer: BLUE CROSS/BLUE SHIELD | Admitting: Sports Medicine

## 2016-11-01 ENCOUNTER — Ambulatory Visit (INDEPENDENT_AMBULATORY_CARE_PROVIDER_SITE_OTHER): Payer: BLUE CROSS/BLUE SHIELD

## 2016-11-01 DIAGNOSIS — M25551 Pain in right hip: Secondary | ICD-10-CM | POA: Diagnosis not present

## 2016-11-01 DIAGNOSIS — M25552 Pain in left hip: Secondary | ICD-10-CM | POA: Diagnosis not present

## 2016-11-01 DIAGNOSIS — G5603 Carpal tunnel syndrome, bilateral upper limbs: Secondary | ICD-10-CM | POA: Diagnosis not present

## 2016-11-01 DIAGNOSIS — G8929 Other chronic pain: Secondary | ICD-10-CM

## 2016-11-01 MED ORDER — TRAMADOL HCL 50 MG PO TABS: ORAL_TABLET | ORAL | 0 refills | Status: DC

## 2016-11-01 NOTE — Assessment & Plan Note (Addendum)
Trochanteric bursa pain has resolved after injection, she is reporting a different pain now, referable more to the joint with internal rotation. X-rays were unremarkable, we are going to proceed with an MRI of the hips without contrast with both hips. Certainly avascular necrosis is a possibility here. Tramadol for pain in the meantime.

## 2016-11-01 NOTE — Progress Notes (Signed)
  Subjective:    CC: Follow-up  HPI: Bilateral hip pain: Did not respond not even temporarily to bilateral trochanteric bursa injection, on further questioning her trochanteric bursa/lateral hip pain has resolved, and she has pain somewhat more anteriorly. Moderate, persistent without radiation.  Past medical history:  Negative.  See flowsheet/record as well for more information.  Surgical history: Negative.  See flowsheet/record as well for more information.  Family history: Negative.  See flowsheet/record as well for more information.  Social history: Negative.  See flowsheet/record as well for more information.  Allergies, and medications have been entered into the medical record, reviewed, and no changes needed.   Review of Systems: No fevers, chills, night sweats, weight loss, chest pain, or shortness of breath.   Objective:    General: Well Developed, well nourished, and in no acute distress.  Neuro: Alert and oriented x3, extra-ocular muscles intact, sensation grossly intact.  HEENT: Normocephalic, atraumatic, pupils equal round reactive to light, neck supple, no masses, no lymphadenopathy, thyroid nonpalpable.  Skin: Warm and dry, no rashes. Cardiac: Regular rate and rhythm, no murmurs rubs or gallops, no lower extremity edema.  Respiratory: Clear to auscultation bilaterally. Not using accessory muscles, speaking in full sentences. Bilateral hips: ROM IR: 60 Deg, ER: 60 Deg, Flexion: 120 Deg, Extension: 100 Deg, Abduction: 45 Deg, Adduction: 45 Deg reproduction of pain with internal rotation. Strength IR: 5/5, ER: 5/5, Flexion: 5/5, Extension: 5/5, Abduction: 5/5, Adduction: 5/5 hip abduction strength is improved considerably. Pelvic alignment unremarkable to inspection and palpation. Standing hip rotation and gait without trendelenburg / unsteadiness. Greater trochanter without tenderness to palpation. No tenderness over piriformis. No SI joint tenderness and normal minimal  SI movement.  Impression and Recommendations:    Bilateral hip pain Trochanteric bursa pain has resolved after injection, she is reporting a different pain now, referable more to the joint with internal rotation. X-rays were unremarkable, we are going to proceed with an MRI of the hips without contrast with both hips. Certainly avascular necrosis is a possibility here. Tramadol for pain in the meantime.  I spent 25 minutes with this patient, greater than 50% was face-to-face time counseling regarding the above diagnoses

## 2016-11-02 ENCOUNTER — Encounter: Payer: Self-pay | Admitting: Sports Medicine

## 2016-11-02 ENCOUNTER — Ambulatory Visit (INDEPENDENT_AMBULATORY_CARE_PROVIDER_SITE_OTHER): Payer: BLUE CROSS/BLUE SHIELD | Admitting: Sports Medicine

## 2016-11-02 ENCOUNTER — Ambulatory Visit (INDEPENDENT_AMBULATORY_CARE_PROVIDER_SITE_OTHER): Payer: Medicare Other | Admitting: Psychiatry

## 2016-11-02 ENCOUNTER — Encounter (HOSPITAL_COMMUNITY): Payer: Self-pay | Admitting: Psychiatry

## 2016-11-02 VITALS — BP 116/64 | HR 82 | Resp 16 | Ht 64.0 in | Wt 125.0 lb

## 2016-11-02 DIAGNOSIS — M25552 Pain in left hip: Secondary | ICD-10-CM

## 2016-11-02 DIAGNOSIS — M797 Fibromyalgia: Secondary | ICD-10-CM

## 2016-11-02 DIAGNOSIS — Z79899 Other long term (current) drug therapy: Secondary | ICD-10-CM

## 2016-11-02 DIAGNOSIS — Z888 Allergy status to other drugs, medicaments and biological substances status: Secondary | ICD-10-CM | POA: Diagnosis not present

## 2016-11-02 DIAGNOSIS — F411 Generalized anxiety disorder: Secondary | ICD-10-CM

## 2016-11-02 DIAGNOSIS — Z87891 Personal history of nicotine dependence: Secondary | ICD-10-CM

## 2016-11-02 DIAGNOSIS — F4310 Post-traumatic stress disorder, unspecified: Secondary | ICD-10-CM | POA: Diagnosis not present

## 2016-11-02 DIAGNOSIS — F5102 Adjustment insomnia: Secondary | ICD-10-CM

## 2016-11-02 DIAGNOSIS — M25551 Pain in right hip: Secondary | ICD-10-CM | POA: Diagnosis not present

## 2016-11-02 DIAGNOSIS — Z9049 Acquired absence of other specified parts of digestive tract: Secondary | ICD-10-CM

## 2016-11-02 DIAGNOSIS — F319 Bipolar disorder, unspecified: Secondary | ICD-10-CM | POA: Diagnosis not present

## 2016-11-02 DIAGNOSIS — Z811 Family history of alcohol abuse and dependence: Secondary | ICD-10-CM

## 2016-11-02 DIAGNOSIS — Z880 Allergy status to penicillin: Secondary | ICD-10-CM

## 2016-11-02 DIAGNOSIS — Z882 Allergy status to sulfonamides status: Secondary | ICD-10-CM

## 2016-11-02 DIAGNOSIS — Z9889 Other specified postprocedural states: Secondary | ICD-10-CM

## 2016-11-02 MED ORDER — DULOXETINE HCL 60 MG PO CPEP: 120.0000 mg | ORAL_CAPSULE | Freq: Every day | ORAL | 1 refills | Status: DC

## 2016-11-02 MED ORDER — MIRTAZAPINE 7.5 MG PO TABS: 7.5000 mg | ORAL_TABLET | Freq: Every day | ORAL | 1 refills | Status: DC

## 2016-11-02 MED ORDER — PREGABALIN 50 MG PO CAPS: ORAL_CAPSULE | ORAL | 0 refills | Status: DC

## 2016-11-02 MED ORDER — LAMOTRIGINE 200 MG PO TABS: 200.0000 mg | ORAL_TABLET | Freq: Every day | ORAL | 1 refills | Status: DC

## 2016-11-02 MED ORDER — TRAZODONE HCL 150 MG PO TABS: 150.0000 mg | ORAL_TABLET | Freq: Every day | ORAL | 1 refills | Status: DC

## 2016-11-02 MED ORDER — ARIPIPRAZOLE 20 MG PO TABS: 20.0000 mg | ORAL_TABLET | Freq: Every day | ORAL | 0 refills | Status: DC

## 2016-11-02 NOTE — Assessment & Plan Note (Signed)
Continue max dose Cymbalta. Hip MRI was completely negative. I am going to restart Lyrica, I did provide her with #63 samples. She can use them 1 or 2 times a day as needed. We are also when to start her on aggressive formal physical therapy. Return in 6 weeks.

## 2016-11-02 NOTE — Progress Notes (Signed)
Patient ID: Mandy Shaw, female   DOB: 1961-02-20, 56 y.o.   MRN: [id number]  Psychiatric Outpatient Follow up visit  [name] MRN:  [id number] Date of Evaluation:  11/02/2016 Referral Source: Self/daughter  Chief Complaint:   Chief Complaint    Follow-up     Visit Diagnosis:    ICD-9-CM ICD-10-CM   1. Bipolar I disorder (HCC) 296.7 F31.9   2. GAD (generalized anxiety disorder) 300.02 F41.1   3. Adjustment insomnia 307.41 F51.02   4. PTSD (post-traumatic stress disorder) 309.81 F43.10    Diagnosis:   Patient Active Problem List   Diagnosis Date Noted  . Aortic atherosclerosis ([place]) [I70.0] 09/23/2016  . Fibromyalgia [M79.7] 09/06/2016  . Carpal tunnel syndrome, bilateral [G56.03] 08/09/2016  . Bilateral hip pain [M25.551, M25.552] 08/09/2016  . Trigger finger, left [M65.30] 04/15/2016  . Underweight [R63.6] 10/22/2015  . Malnutrition, calorie ([place]) [E46] 10/22/2015  . Pain in lower limb [M79.606] 09/17/2015  . Microalbuminuria [R80.9] 08/22/2015  . Former smoker [id number] 09/17/2014  . Insomnia [G47.00] 04/09/2014  . 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 Hypothyroidism [E03.9] 07/05/2013  . Herpes [B00.9] 09/05/2012  . Hyperlipidemia [E78.5] 06/05/2010  . Bipolar disorder ([place]) [F31.9] 08/22/2009  . 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 PTSD [F43.10] 08/22/2009  . Migraine headache [G43.909] 08/22/2009  . KNEE PAIN [M25.569] 08/22/2009  . COPD (chronic obstructive pulmonary disease) with chronic bronchitis ([place]) [J44.9] 07/23/2009  . RHEUMATOID ARTHRITIS [M06.9] 07/23/2009   History of Present Illness:  56 years old currently married Caucasian female initially  referred by herself and her daughter she follows with Dr. Ward Chatters upstairs as primary care. Has  been diagnosed bipolar and died a condition, PTSD disability since 31.   PTSD:infrequent flashbacks Bipolar: She has  been eating somewhat reasonable on Abilify but apparently then increase that there is some concern of the pharmacy and preauthorization as needed he'll work on that but Abilify needs to be adjusted and increased to 20 mg that helps that she has not responded better on Risperdal and Seroquel in the past Abilify helps with mood stabilizer . Insomnia: on trazadone and remeron  pain effects mood . Carpel tunnel exacerbates. Following with providers   Aggravating factors; her medical condition . Husband is blind. Husband is blind since 2012. Multiple etiology factors for depression including history of trauma in the past she also has been diagnosed with borderline personality with suicide attempts in the past Modifying factors; she loves her husband and her support. Her kids. Location: anxiety, depression, insomnia Context: medical health, husband blind, trauma Severity of depression:5.5/10  Duration: more then 20 years    Past Medical History:  Past Medical History:  Diagnosis Date  . Arthritis   . Bipolar 1 disorder ([place])   . COPD (chronic obstructive pulmonary disease) ([place])   . GERD (gastroesophageal reflux disease)   . Insomnia   . Myasthenia gravis ([place])   . 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 Personality disorder   . Thyroid disease     Past Surgical History:  Procedure Laterality Date  . CARDIAC SURGERY     flap on heart removed  . CESAREAN SECTION    . CHOLECYSTECTOMY    . CONDYLOMA EXCISION/FULGURATION    . EYE SURGERY    . INCONTINENCE SURGERY    . INDUCED ABORTION    . LAPAROTOMY     exploratory for pain  .  LEEP    . Left hip surgery  01/09/14   Dr. Arnette Norris  . right hip surgery  09/26/2013   Dr. Arnette Norris  . SHOULDER SURGERY Right 2015   Dr. Margretta Sidle  . THYROIDECTOMY     Family History:  Family History  Problem Relation Age of Onset  . Alcohol abuse Father    Social History:   Social History   Social History  . Marital  status: Married    Spouse name: N/A  . Number of children: 1  . Years of education: N/A   Occupational History  . disabled    Social History Main Topics  . Smoking status: Former Smoker    Packs/day: 1.50    Years: 40.00    Types: Cigarettes    Quit date: 10/22/2015  . Smokeless tobacco: Never Used  . Alcohol use 0.0 oz/week     Comment: 1-2 drinks yearly   . Drug use: No  . Sexual activity: Yes    Partners: Male     Comment: married, 1 daughter, 1  1/2 yrs college, self-employed, no reg exercise, 3 caffeine drinks daily.   Other Topics Concern  . None   Social History Narrative   Husband is legally blind. Likes spending weekends at [employer] in [place]. Working our regularly.       Psychiatric Specialty Exam: HPI  Review of Systems  Constitutional: Negative for fever.  Cardiovascular: Negative for palpitations.  Musculoskeletal: Positive for myalgias.  Skin: Negative for rash.  Neurological: Negative for tremors.  Psychiatric/Behavioral: Negative for depression and suicidal ideas.    Blood pressure 116/64, pulse 82, resp. rate 16, height 5\' 4"  (1.626 m), weight 125 lb (56.7 kg), last menstrual period 06/20/2014, SpO2 96 %.Body mass index is 21.46 kg/m.  General Appearance: Casual  Eye Contact:  Fair  Speech:  Slow  Volume: normal  Mood: fair but somewhat stressed  Affect:  Congruent  Thought Process:  Coherent  Orientation:  Full (Time, Place, and Person)  Thought Content:  Rumination  Suicidal Thoughts:  No  Homicidal Thoughts:  No  Memory:  Immediate;   Fair Recent;   Fair  Judgement:  Fair  Insight:  Shallow  Psychomotor Activity:  Decreased  Concentration:  Fair  Recall:  [employer] of Knowledge:Fair  Language: Fair  Akathisia:  Negative  Handed:  Right  AIMS (if indicated):    Assets:  Desire for Improvement Social Support  ADL's:  Intact  Cognition: WNL  Sleep:  Poor to variable     Allergies:   Allergies  Allergen Reactions  .  Albuterol Anaphylaxis  . Other Anaphylaxis    MANGO, PARSLEY.  [name] Seed Anaphylaxis  . Silica [Nutritional Supplements] Itching  . Macrodantin [Nitrofurantoin Macrocrystal] Itching  . Neurontin [Gabapentin]   . Penicillins     REACTION: Anaphalatic  . Sulfonamide Derivatives     REACTION: Anaphalatic   Current Medications: Current Outpatient Prescriptions  Medication Sig Dispense Refill  . ARIPiprazole (ABILIFY) 20 MG tablet Take 1 tablet (20 mg total) by mouth daily. 30 tablet 0  . azaTHIOprine (IMURAN) 50 MG tablet Take 50 mg by mouth 2 (two) times daily.    . Calcium Carb-Cholecalciferol (CALCIUM 600 + D PO) Take by mouth.    . capsicum (ZOSTRIX) 0.075 % topical cream Apply 1 application topically 2 (two) times daily. 28.3 g 0  . DOCUSATE CALCIUM PO Take by mouth.    . DULoxetine (CYMBALTA) 60 MG capsule Take 2  capsules (120 mg total) by mouth daily. 60 capsule 1  . famotidine (PEPCID) 20 MG tablet take 1 tablet by mouth daily 30 tablet 6  . hydrochlorothiazide (HYDRODIURIL) 25 MG tablet Take 25 mg by mouth 2 (two) times daily.  1  . ipratropium (ATROVENT HFA) 17 MCG/ACT inhaler Inhale 2 puffs into the lungs every 6 (six) hours as needed for wheezing. 1 Inhaler 2  . KRISTALOSE 10 g packet   0  . lamoTRIgine (LAMICTAL) 200 MG tablet Take 1 tablet (200 mg total) by mouth daily. One per day. 30 tablet 1  . levofloxacin (LEVAQUIN) 250 MG tablet Day 1 take 2 tabs (500mg ) once, Day 2-7: take 1 tab (250mg ) 8 tablet 0  . levothyroxine (SYNTHROID, LEVOTHROID) 125 MCG tablet take 1 tablet by mouth daily 30 tablet 3  . linaclotide (LINZESS) 72 MCG capsule Take by mouth.    . mirtazapine (REMERON) 7.5 MG tablet Take 1 tablet (7.5 mg total) by mouth at bedtime. One at night 30 tablet 1  . Multiple Vitamins-Minerals (CENTRUM SILVER ADULT 50+ PO) Take 1 capsule by mouth daily.    . Multiple Vitamins-Minerals (HAIR SKIN AND NAILS FORMULA PO) Take 1 tablet by mouth daily.    . potassium  chloride (K-DUR) 10 MEQ tablet take 1 tablet by mouth once daily    . primidone (MYSOLINE) 50 MG tablet   0  . rizatriptan (MAXALT) 10 MG tablet take 1 tablet by mouth AT ONSET OF HEADACHE. may repeat after 2 h...  (REFER TO PRESCRIPTION NOTES).  0  . simvastatin (ZOCOR) 20 MG tablet take 1 tablet by mouth ONCE DAILY; PLEASE SEE PROVIDER FOR LIPID PANEL 90 tablet 3  . tizanidine (ZANAFLEX) 2 MG capsule Take 4 mg by mouth 2 (two) times daily as needed for muscle spasms.    . Topiramate ER (TROKENDI XR) 200 MG CP24 Take 200 mg by mouth daily.    . traMADol (ULTRAM) 50 MG tablet 1-2 tabs by mouth Q8 hours, maximum 6 tabs per day. 20 tablet 0  . traZODone (DESYREL) 150 MG tablet Take 1 tablet (150 mg total) by mouth at bedtime. Please discontinue all previous prescriptions. 30 tablet 1  . Turmeric Curcumin 500 MG CAPS Take by mouth.     No current facility-administered medications for this visit.       Treatment Plan Summary: Medication management and Plan as follows  1. Bipolar: fluctuates. Need to get back on abilify and will adjust to 20mg  . Pre auth may be needed. Continue lamictal. No rash 2. PTSD: occasional flashbacks. Continue cymbalta 3. Insomnia: reviewed sleep hygiene. trazadone helps. Continue remeron  Reviewed side effects provided supportive therapy follow-up in 1-1/2-2 months Taydem Cavagnaro 2/13/20181:41 PM

## 2016-11-02 NOTE — Progress Notes (Signed)
  Subjective:    CC: MRI results  HPI: This is a pleasant 56 year old female with a history of fibromyalgia who comes in for follow-up, we have been treating her bilateral hip pain for some time now without much improvement, ultimately we performed bilateral trochanteric bursa injections, she reported no improvement, not even temporarily and tells me her symptoms are worse. We obtained an MRI at the last visit, both hips were visible in both hips were unremarkable, no issues with the tendons, labrum, joint.  She has also had a cervical spine MRI in the recent past that was completely negative. Lumbar spine x-rays were completely negative.  Past medical history:  Negative.  See flowsheet/record as well for more information.  Surgical history: Negative.  See flowsheet/record as well for more information.  Family history: Negative.  See flowsheet/record as well for more information.  Social history: Negative.  See flowsheet/record as well for more information.  Allergies, and medications have been entered into the medical record, reviewed, and no changes needed.   Review of Systems: No fevers, chills, night sweats, weight loss, chest pain, or shortness of breath.   Objective:    General: Well Developed, well nourished, and in no acute distress.  Neuro: Alert and oriented x3, extra-ocular muscles intact, sensation grossly intact.  HEENT: Normocephalic, atraumatic, pupils equal round reactive to light, neck supple, no masses, no lymphadenopathy, thyroid nonpalpable.  Skin: Warm and dry, no rashes. Cardiac: Regular rate and rhythm, no murmurs rubs or gallops, no lower extremity edema.  Respiratory: Clear to auscultation bilaterally. Not using accessory muscles, speaking in full sentences.  Impression and Recommendations:    Fibromyalgia Continue max dose Cymbalta. Hip MRI was completely negative. I am going to restart Lyrica, I did provide her with #63 samples. She can use them 1 or 2 times a  day as needed. We are also when to start her on aggressive formal physical therapy. Return in 6 weeks.  Bilateral hip pain Continue max dose Cymbalta. Hip MRI was completely negative. I am going to restart Lyrica, I did provide her with #63 samples. She can use them 1 or 2 times a day as needed. We are also when to start her on aggressive formal physical therapy. Return in 6 weeks.  I spent 25 minutes with this patient, greater than 50% was face-to-face time counseling regarding the above diagnoses

## 2016-11-05 ENCOUNTER — Telehealth: Payer: Self-pay

## 2016-11-05 DIAGNOSIS — G894 Chronic pain syndrome: Secondary | ICD-10-CM

## 2016-11-05 NOTE — Telephone Encounter (Signed)
It is supposed to do that initially, she needs to stick with it and this will pass, have her drink a cup of coffee if she feels sluggish.

## 2016-11-05 NOTE — Telephone Encounter (Signed)
Pt left VM stating Lyrica is making her feel funny, she's been sleepy, fatigue and has the "blah's". Pt would like to know what to do. Please advise.

## 2016-11-05 NOTE — Telephone Encounter (Signed)
Stopping the suspected offending medication is the treatment. She is on 6 other medications that are known offenders, ultimately we may need to reset, and stop all of these medications and only add them back as needed.

## 2016-11-05 NOTE — Telephone Encounter (Signed)
Spoke with pt states she's dizzy, nausea, and can't hold anything down. States she felt drunk and was bumping into walls and can't drive. Spoke with Dr. Darene Lamer and he suggested that she push through the medication. Pt states she can't do that the side effects are really bad and she can't function with the medication. Would like to know if there is anything that will relieve her dizziness. Please advise.

## 2016-11-09 ENCOUNTER — Ambulatory Visit (INDEPENDENT_AMBULATORY_CARE_PROVIDER_SITE_OTHER): Payer: BLUE CROSS/BLUE SHIELD | Admitting: Physician Assistant

## 2016-11-09 ENCOUNTER — Ambulatory Visit (INDEPENDENT_AMBULATORY_CARE_PROVIDER_SITE_OTHER): Payer: BLUE CROSS/BLUE SHIELD

## 2016-11-09 ENCOUNTER — Encounter: Payer: Self-pay | Admitting: Physician Assistant

## 2016-11-09 VITALS — BP 87/57 | HR 81 | Ht 64.0 in | Wt 125.0 lb

## 2016-11-09 DIAGNOSIS — R10817 Generalized abdominal tenderness: Secondary | ICD-10-CM | POA: Diagnosis not present

## 2016-11-09 DIAGNOSIS — K5900 Constipation, unspecified: Secondary | ICD-10-CM

## 2016-11-09 DIAGNOSIS — R319 Hematuria, unspecified: Secondary | ICD-10-CM | POA: Diagnosis not present

## 2016-11-09 LAB — POCT URINALYSIS DIPSTICK
BILIRUBIN UA: NEGATIVE
Glucose, UA: NEGATIVE
Ketones, UA: NEGATIVE
NITRITE UA: NEGATIVE
PH UA: 7
Protein, UA: NEGATIVE
Spec Grav, UA: 1.01
UROBILINOGEN UA: 0.2

## 2016-11-09 NOTE — Progress Notes (Addendum)
Subjective:     Patient ID: Mandy Shaw, female   DOB: 10/21/1960, 56 y.o.   MRN: [id number]  HPI  This is a 56 year old female who presents for abdominal pain x 3 weeks. She states the pain is located in her lower abdomen and radiates up the right side. She reports previously the pain would come and go after eating, however a few days ago it became constant and is getting progressively more painful. Endorses 3 episodes of vomiting yesterday with associated nausea. Denies diarrhea, fever, chills, urinary symptoms. Endorses constipation for several months and is currently taking Linzess and Dulcolax with virtually no improvement. Had CT scan performed beginning of January due to abdominal pain and it was unremarkable aside from showing large stool burden. Has history of an exploratory abdominal surgery 21 years ago.  Pt has a GI and nephrologist. She feels like GI "has given up on her".   Review of Systems All other ROS negative except those noted in the HPI.    Objective:   Physical Exam  Constitutional: She is oriented to person, place, and time. She appears well-developed and well-nourished.  HENT:  Head: Normocephalic and atraumatic.  Neck: Normal range of motion. Neck supple.  Cardiovascular: Normal rate and regular rhythm.  Exam reveals no gallop and no friction rub.   No murmur heard. Pulmonary/Chest: Effort normal and breath sounds normal. No respiratory distress. She has no wheezes. She has no rales.  Abdominal: Soft. Bowel sounds are normal. She exhibits no distension and no mass. There is no hepatosplenomegaly. There is generalized tenderness. There is CVA tenderness. There is no rigidity, no rebound and no guarding. No hernia.  Musculoskeletal: Normal range of motion.  Lymphadenopathy:    She has no cervical adenopathy.  Neurological: She is alert and oriented to person, place, and time.  Skin: Skin is warm and dry. No rash noted. No erythema.  Psychiatric: She has a normal mood  and affect. Her speech is delayed.   Urinalysis: positive leukocytes and trace blood, all other results WNL.     Assessment/Plan:  Diagnoses and all orders for this visit:  Generalized abdominal tenderness without rebound tenderness -     DG Abd 1 View -     CBC with Differential/Platelet -     POCT urinalysis dipstick -     Urine Culture -     CBC With Differential  Constipation, unspecified constipation type -     CBC With Differential  Hematuria, unspecified type -     Urine Culture -     CBC With Differential  - Patient states she cannot currently afford a CT scan and last one was 09/2016, so abdominal x-ray was ordered to assess stool burden and rule out obstruction or perforated viscus. Ordered CBC to assess for acute infection or inflammatory process. Given history of constipation and diffuse tenderness over abdomen this is most likely constipation. Based on x-ray results may replace Linzess with Trulance however would have to run this by nephrologist. May also increase Lactulose dose or may administer enema depending on amount of stool. We also discussed the possibility of this being a flare from her fibromyalgia. - Urinalysis today showed leukocytes and trace blood, however 2 weeks ago a UA showed similar results and culture came back negative for bacterial growth. For this reason, will hold off on antibiotic until culture comes back.   Spent 30 minutes with patient and greater than 50 percent of visit spent counseling patient regarding  treatment plan.

## 2016-11-10 ENCOUNTER — Telehealth (HOSPITAL_COMMUNITY): Payer: Self-pay | Admitting: *Deleted

## 2016-11-10 ENCOUNTER — Other Ambulatory Visit: Payer: Self-pay | Admitting: Physician Assistant

## 2016-11-10 ENCOUNTER — Encounter: Payer: Self-pay | Admitting: Physical Therapy

## 2016-11-10 ENCOUNTER — Ambulatory Visit (INDEPENDENT_AMBULATORY_CARE_PROVIDER_SITE_OTHER): Payer: BLUE CROSS/BLUE SHIELD | Admitting: Physical Therapy

## 2016-11-10 DIAGNOSIS — M6281 Muscle weakness (generalized): Secondary | ICD-10-CM

## 2016-11-10 DIAGNOSIS — M25552 Pain in left hip: Secondary | ICD-10-CM

## 2016-11-10 DIAGNOSIS — M25551 Pain in right hip: Secondary | ICD-10-CM | POA: Diagnosis not present

## 2016-11-10 LAB — CBC AND DIFFERENTIAL
HEMATOCRIT: 42 % (ref 36–46)
HEMOGLOBIN: 14 g/dL (ref 12.0–16.0)
Neutrophils Absolute: 4 /uL
WBC: 6.6 10^3/mL

## 2016-11-10 MED ORDER — LEVOFLOXACIN 750 MG PO TABS: 750.0000 mg | ORAL_TABLET | ORAL | 0 refills | Status: DC

## 2016-11-10 NOTE — Telephone Encounter (Signed)
I would probably start with Zanaflex and decreasing topiramate. There are lot of psychotropic medications on board, this may take a visit with her PCP to see which can be tapered off.

## 2016-11-10 NOTE — Therapy (Signed)
[place], [place], [address] Phone: [phone]   Fax:  [phone]  Physical Therapy Evaluation  Patient Details  Name: Mandy Shaw MRN: [id number] Date of Birth: [birth year] Referring Provider: Dr Dianah Field  Encounter Date: 11/10/2016      PT End of Session - 11/10/16 1431    Visit Number 1   Number of Visits 6   Date for PT Re-Evaluation 12/22/16   PT Start Time [id number]   PT Stop Time 1516   PT Time Calculation (min) 45 min      Past Medical History:  Diagnosis Date  . Arthritis   . Bipolar 1 disorder ([place])   . COPD (chronic obstructive pulmonary disease) ([place])   . GERD (gastroesophageal reflux disease)   . Insomnia   . Myasthenia gravis ([place])   . 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 Personality disorder   . Thyroid disease     Past Surgical History:  Procedure Laterality Date  . CARDIAC SURGERY     flap on heart removed  . CESAREAN SECTION    . CHOLECYSTECTOMY    . CONDYLOMA EXCISION/FULGURATION    . EYE SURGERY    . INCONTINENCE SURGERY    . INDUCED ABORTION    . LAPAROTOMY     exploratory for pain  . LEEP    . Left hip surgery  01/09/14   Dr. Arnette Norris  . right hip surgery  09/26/2013   Dr. Arnette Norris  . SHOULDER SURGERY Right 2015   Dr. Margretta Sidle  . THYROIDECTOMY      There were no vitals filed for this visit.       Subjective Assessment - 11/10/16 1423    Subjective Shamekia reports her hips have gotten worse over the last couple of months, she thinks it has to do with her knees.  MD's feel it is due to fibromyalgia. Continues to do her HEP from before and going to the [employer] 5-6 times a week, riding recumbent bike, core work with machine and on floor, suspension squats, silver sneakers yoga and exercise class.  New dx of myasthenias gravis, pt reports she started on medicine for this 6 months.  New foot drop Rt   Pertinent History  fibromyalgia, neck and back pain, knee issues, depression - lost a lot of weight, bilat hip surgery - pins in bilat femurs - 3 yrs ago   How long can you stand comfortably? 10'   Diagnostic tests All dx x-rays have come back negative.    Patient Stated Goals trying PT to see if it helps and if she has any deficits    Currently in Pain? Yes   Pain Score 4   up to 8/10 at times   Pain Location Hip   Pain Orientation Left;Right   Pain Descriptors / Indicators Stabbing;Dull  deep   Pain Type Chronic pain   Pain Onset More than a month ago   Pain Frequency Constant   Aggravating Factors  notices hip pain after being on her feet for > 10', doing dishes, brushing teeth when hip pushes out to the side.    Pain Relieving Factors varies in intensity with position            [facility] PT Assessment - 11/10/16 0001      Assessment   Medical Diagnosis chronic bilat hip pain   Referring Provider Dr Dianah Field   Onset Date/Surgical Date 06/10/16  Next MD Visit PRN   Prior Therapy yes for ankle     Precautions   Precautions None     Balance Screen   Has the patient fallen in the past 6 months Yes   How many times? 3  lots of near misses   Has the patient had a decrease in activity level because of a fear of falling?  No   Is the patient reluctant to leave their home because of a fear of falling?  No     Home Environment   Living Environment Private residence   Living Arrangements Spouse/significant other   Home Access Stairs to enter  has to be careful   Home Layout One level     Prior Function   Vocation On disability   Leisure watch TV, work with her puppy     Observation/Other Assessments   Focus on Therapeutic Outcomes (FOTO)  57% limited     Functional Tests   Functional tests Single leg stance     Single Leg Stance   Comments ~ 2 sec bilat      Posture/Postural Control   Posture/Postural Control Postural limitations   Postural Limitations Forward head;Rounded  Shoulders;Increased thoracic kyphosis  Lt shoulder complex elevated, ? scoliosis   Posture Comments LE valgus     ROM / Strength   AROM / PROM / Strength AROM;Strength     AROM   Overall AROM Comments lumbar WNL     Strength   Strength Assessment Site Hip;Ankle;Knee   Right/Left Hip Right;Left   Right Hip Flexion 5/5   Right Hip Extension 4/5   Right Hip ABduction 4+/5   Left Hip Flexion 4-/5   Left Hip Extension 4-/5   Left Hip ABduction 4/5   Right/Left Knee Left  Rt WNL   Left Knee Flexion --  5-/5   Left Knee Extension 4/5   Right/Left Ankle --  DF WNL, PF 4+/5, inver/ever 4-/5     Flexibility   Soft Tissue Assessment /Muscle Length yes   Hamstrings WNL   Quadriceps WNL   ITB WNL     Palpation   Palpation comment pain with palpation bilat hip greater troch in sidelying, only Lt side in prone                   [facility] Adult PT Treatment/Exercise - 11/10/16 0001      Exercises   Exercises Knee/Hip     Knee/Hip Exercises: Supine   Bridges Strengthening;Both;10 reps  with green band around thighs   Bridges with Clamshell Strengthening;Both;10 reps  with green band around knees   Other Supine Knee/Hip Exercises TA contraction with knee in/out each side.                 PT Education - 11/10/16 1509    Education provided Yes   Education Details HEP    Person(s) Educated Patient   Methods Explanation;Handout;Demonstration   Comprehension Returned demonstration;Verbalized understanding             PT Long Term Goals - 11/10/16 1635      PT LONG TERM GOAL #1   Title I with advanced HEP for hip and core ( 12/08/16)     Time 6   Period Weeks   Status New     PT LONG TERM GOAL #2   Title report overall pain reduction in her hip  =/> 50% ( 12/08/16)    Time 6   Period Weeks  Status New     PT LONG TERM GOAL #3   Title increase bilat strength in hips =/> 5-/5 ( 12/08/16)    Time 6   Period Weeks   Status New     PT LONG TERM  GOAL #4   Title improve FOTO =/< 45% limited ( 12/08/16)    Time 6   Period Weeks   Status New               Plan - 11/10/16 1658    Clinical Impression Statement 56 yo female presents for moderate complexity PT eval for bilat hip pain. She has multiple other medical issues that complicate her diagnosis and rehab potential.  She does have weakness through her hips, core and Lt knee.  Hip flexibility is good, spine is hypomobile. She reports new diagnosis of myasthenias gravis however this is not listed in her problem list with this referring MD.    Rehab Potential Fair   PT Frequency 1x / week   PT Duration 6 weeks   PT Treatment/Interventions Moist Heat;Ultrasound;Therapeutic exercise;Dry needling;Taping;Manual techniques;Neuromuscular re-education;Cryotherapy;Electrical Stimulation;Iontophoresis 4mg /ml Dexamethasone;Patient/family education   PT Next Visit Plan hip and core strengthening    Consulted and Agree with Plan of Care Patient      Patient will benefit from skilled therapeutic intervention in order to improve the following deficits and impairments:  Impaired sensation, Decreased strength, Pain, Impaired perceived functional ability  Visit Diagnosis: Muscle weakness (generalized) - Plan: PT plan of care cert/re-cert  Pain in right hip - Plan: PT plan of care cert/re-cert  Pain in left hip - Plan: PT plan of care cert/re-cert     Problem List Patient Active Problem List   Diagnosis Date Noted  . Constipation 11/09/2016  . Aortic atherosclerosis ([place]) 09/23/2016  . Fibromyalgia 09/06/2016  . Carpal tunnel syndrome, bilateral 08/09/2016  . Bilateral hip pain 08/09/2016  . Trigger finger, left 04/15/2016  . Underweight 10/22/2015  . Malnutrition, calorie ([place]) 10/22/2015  . Pain in lower limb 09/17/2015  . Microalbuminuria 08/22/2015  . Former smoker 09/17/2014  . Insomnia 04/09/2014  . 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 Hypothyroidism 07/05/2013  . Herpes 09/05/2012  . Hyperlipidemia 06/05/2010  . Bipolar disorder ([place]) 08/22/2009  . BORDERLINE PERSONALITY DISORDER 08/22/2009  . PTSD 08/22/2009  . Migraine headache 08/22/2009  . KNEE PAIN 08/22/2009  . COPD (chronic obstructive pulmonary disease) with chronic bronchitis ([place]) 07/23/2009  . RHEUMATOID ARTHRITIS 07/23/2009    Jeral Pinch PT  11/10/2016, 5:07 PM  [facility] [place], [place], [address] Phone: [phone]   Fax:  [phone]  Name: Mandy Shaw MRN: [id number] Date of Birth: March 24, 1961

## 2016-11-10 NOTE — Patient Instructions (Signed)
      One knee bent, one leg straight, on pillow. Slowly roll bent knee out. Be sure pelvis does not rotate. Do _10__ times. Restabilize pelvis. Repeat with other leg. Do _1-2__ sets, _1__ times per day. [facility] [place], [place] [address]  [phone] (office) [phone] (fax)

## 2016-11-10 NOTE — Telephone Encounter (Signed)
They need contact directly office. I dont do disability evaluations on my patients. Records can be sent with patient permission.

## 2016-11-10 NOTE — Progress Notes (Signed)
Xray cannot rule out bowel obstruction. It is indicated to get CT. Treatment for small bowel obstruction is bowel rest(no eating) and at times NG tube. If pain is worsening YOU MUST go to ER. At least there they can put you on a payment plan.  Moderate stool burden is seen in colon. Do you have krislose? If not I can send and increase to 40g. Make sure drinking 80-100 oz.

## 2016-11-10 NOTE — Progress Notes (Signed)
Levaquin was sent to take every other day due to creatine clearance.

## 2016-11-10 NOTE — Telephone Encounter (Signed)
Pt lvm requesting a letter for Social Sercurity Disability regarding her mental status and her ability to work. Please advise.

## 2016-11-10 NOTE — Telephone Encounter (Signed)
Pt notified and would like to know what medications would be stopped because she really felt bad on the lyrica and tramadol didn't work. Please advise.

## 2016-11-10 NOTE — Progress Notes (Signed)
Creatine clearance 36 levaquin 750mg  every other day for 5 doses.   Pt calls back with urinary symptoms. Positive for blood and leuks yesterday but waiting for culture since asymptomatic. Since symptoms started she wanted to start abx. She has numerous allergies and pt reports "cipro does not work".

## 2016-11-10 NOTE — Telephone Encounter (Signed)
Return call to pt. Per Dr. De Nurse, please inform pt Social security Disability office will need to contact the office. No letter will be provided at this time. Pt verbalizes understanding.

## 2016-11-11 NOTE — Telephone Encounter (Signed)
Pt would like a referral to Pain Management. She's see Dr. Selinda Orion before and would like to go back to her. Please assist.

## 2016-11-11 NOTE — Telephone Encounter (Signed)
Referral placed.

## 2016-11-11 NOTE — Addendum Note (Signed)
Addended by: Silverio Decamp on: 11/11/2016 03:35 PM   Modules accepted: Orders

## 2016-11-12 LAB — URINE CULTURE

## 2016-11-15 ENCOUNTER — Ambulatory Visit: Payer: Medicare Other | Admitting: Podiatry

## 2016-11-17 ENCOUNTER — Ambulatory Visit (INDEPENDENT_AMBULATORY_CARE_PROVIDER_SITE_OTHER): Payer: BLUE CROSS/BLUE SHIELD | Admitting: Physical Therapy

## 2016-11-17 ENCOUNTER — Other Ambulatory Visit: Payer: Self-pay | Admitting: Family Medicine

## 2016-11-17 ENCOUNTER — Encounter: Payer: Self-pay | Admitting: Physical Therapy

## 2016-11-17 DIAGNOSIS — M6281 Muscle weakness (generalized): Secondary | ICD-10-CM

## 2016-11-17 DIAGNOSIS — M25552 Pain in left hip: Secondary | ICD-10-CM | POA: Diagnosis not present

## 2016-11-17 DIAGNOSIS — M25551 Pain in right hip: Secondary | ICD-10-CM

## 2016-11-17 NOTE — Therapy (Signed)
[place], [place], [address] Phone: [phone]   Fax:  [phone]  Physical Therapy Treatment  Patient Details  Name: Mandy Shaw MRN: [id number] Date of Birth: 1960/10/19 Referring Provider: Dr Dianah Field  Encounter Date: 11/17/2016      PT End of Session - 11/17/16 1435    Visit Number 2  on heat for 15 min   Number of Visits 6   Date for PT Re-Evaluation 12/22/16   PT Start Time 1432   PT Stop Time 1518   PT Time Calculation (min) 46 min   Activity Tolerance Patient tolerated treatment well      Past Medical History:  Diagnosis Date  . Arthritis   . Bipolar 1 disorder ([place])   . COPD (chronic obstructive pulmonary disease) ([place])   . GERD (gastroesophageal reflux disease)   . Insomnia   . Myasthenia gravis ([place])   . 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 Personality disorder   . Thyroid disease     Past Surgical History:  Procedure Laterality Date  . CARDIAC SURGERY     flap on heart removed  . CESAREAN SECTION    . CHOLECYSTECTOMY    . CONDYLOMA EXCISION/FULGURATION    . EYE SURGERY    . INCONTINENCE SURGERY    . INDUCED ABORTION    . LAPAROTOMY     exploratory for pain  . LEEP    . Left hip surgery  01/09/14   Dr. Arnette Norris  . right hip surgery  09/26/2013   Dr. Arnette Norris  . SHOULDER SURGERY Right 2015   Dr. Margretta Sidle  . THYROIDECTOMY      There were no vitals filed for this visit.      Subjective Assessment - 11/17/16 1433    Subjective Mandy Shaw reports she has been taking an antibiotic that has been making her sick to her stomach so she hasn't been able to go to the [facility], has been doing her HEP. Also reports that her hips are more sore.    Currently in Pain? Yes   Pain Score 5    Pain Location Hip   Pain Orientation Left;Right   Pain Descriptors / Indicators Stabbing   Pain Type Chronic pain   Pain Onset More than a month ago   Pain Frequency Constant   Aggravating Factors  prolonged walking, stairs.    Pain Relieving Factors trying to change positions            [facility] PT Assessment - 11/17/16 0001      Assessment   Medical Diagnosis chronic bilat hip pain                     [facility] Adult PT Treatment/Exercise - 11/17/16 0001      Knee/Hip Exercises: Stretches   Other Knee/Hip Stretches pelvic rocks with toe taps and without  SKTC stretch x 30 sec each     Knee/Hip Exercises: Aerobic   Nustep L4x6' VC for form and good alignment     Knee/Hip Exercises: Supine   Quad Sets Strengthening;Both;10 reps  10 sec holds, Lt side - towel under knee   Straight Leg Raise with External Rotation Strengthening;Both;10 reps  with TA contraction   Other Supine Knee/Hip Exercises 10 reps each isometric hip flexion ipsalateral & contralateral hands     Knee/Hip Exercises: Prone   Hip Extension Strengthening;Both;2 sets;10 reps  increased back pain  Modalities   Modalities Moist Heat     Moist Heat Therapy   Number Minutes Moist Heat 15 Minutes   Moist Heat Location Lumbar Spine;Knee  bilat knees                     PT Long Term Goals - 11/17/16 1444      PT LONG TERM GOAL #1   Title I with advanced HEP for hip and core ( 12/08/16)     Status On-going     PT LONG TERM GOAL #2   Title report overall pain reduction in her hip  =/> 50% ( 12/08/16)    Status On-going     PT LONG TERM GOAL #3   Title increase bilat strength in hips =/> 5-/5 ( 12/08/16)    Status On-going     PT LONG TERM GOAL #4   Title improve FOTO =/< 45% limited ( 12/08/16)    Status On-going     PT LONG TERM GOAL #5   Title improve bilat hip strength =/> 4+/5 ( 07/28/16)    Status On-going               Plan - 11/17/16 1447    Clinical Impression Statement Mandy Shaw has trouble isolating quad contraction, gluts kick in to help.  This is only her second visit, no goals met. Continues to have pain in  her hips and knees. She is very weak in the whole lower quarter.    Rehab Potential Fair   PT Frequency 1x / week   PT Duration 6 weeks   PT Treatment/Interventions Moist Heat;Ultrasound;Therapeutic exercise;Dry needling;Taping;Manual techniques;Neuromuscular re-education;Cryotherapy;Electrical Stimulation;Iontophoresis 79m/ml Dexamethasone;Patient/family education   PT Next Visit Plan hip and core strengthening    Consulted and Agree with Plan of Care Patient      Patient will benefit from skilled therapeutic intervention in order to improve the following deficits and impairments:  Impaired sensation, Decreased strength, Pain, Impaired perceived functional ability  Visit Diagnosis: Muscle weakness (generalized)  Pain in right hip  Pain in left hip     Problem List Patient Active Problem List   Diagnosis Date Noted  . Constipation 11/09/2016  . Aortic atherosclerosis ([place] 09/23/2016  . Fibromyalgia 09/06/2016  . Carpal tunnel syndrome, bilateral 08/09/2016  . Bilateral hip pain 08/09/2016  . Trigger finger, left 04/15/2016  . Underweight 10/22/2015  . Malnutrition, calorie ([place] 10/22/2015  . Pain in lower limb 09/17/2015  . Microalbuminuria 08/22/2015  . Former smoker 09/17/2014  . Insomnia 04/09/2014  . 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 Hypothyroidism 07/05/2013  . Herpes 09/05/2012  . Hyperlipidemia 06/05/2010  . Bipolar disorder ([place] 08/22/2009  . BORDERLINE PERSONALITY DISORDER 08/22/2009  . PTSD 08/22/2009  . Migraine headache 08/22/2009  . KNEE PAIN 08/22/2009  . COPD (chronic obstructive pulmonary disease) with chronic bronchitis ([place] 07/23/2009  . RHEUMATOID ARTHRITIS 07/23/2009    SJeral PinchPT 11/17/2016, 3:07 PM  [facility] [address]Phone: [phone]  Fax:  [phone] Name: Mandy KEYSMRN:  [id number] of Birth: 407-31-1962

## 2016-11-19 ENCOUNTER — Encounter (HOSPITAL_COMMUNITY)
Admission: RE | Admit: 2016-11-19 | Discharge: 2016-11-19 | Disposition: A | Discharge status: Home / Self Care | Payer: BLUE CROSS/BLUE SHIELD | Source: Ambulatory Visit | Attending: Orthopedic Surgery | Admitting: Orthopedic Surgery

## 2016-11-19 ENCOUNTER — Encounter (HOSPITAL_COMMUNITY): Payer: Self-pay

## 2016-11-19 DIAGNOSIS — G5602 Carpal tunnel syndrome, left upper limb: Secondary | ICD-10-CM | POA: Insufficient documentation

## 2016-11-19 DIAGNOSIS — Z01812 Encounter for preprocedural laboratory examination: Secondary | ICD-10-CM | POA: Insufficient documentation

## 2016-11-19 HISTORY — DX: Dyspnea, unspecified: R06.00

## 2016-11-19 HISTORY — DX: Chronic kidney disease, unspecified: N18.9

## 2016-11-19 HISTORY — DX: Hypothyroidism, unspecified: E03.9

## 2016-11-19 HISTORY — DX: Anxiety disorder, unspecified: F41.9

## 2016-11-19 LAB — COMPREHENSIVE METABOLIC PANEL
ALT: 20 U/L (ref 14–54)
AST: 28 U/L (ref 15–41)
Albumin: 4 g/dL (ref 3.5–5.0)
Alkaline Phosphatase: 52 U/L (ref 38–126)
Anion gap: 7 (ref 5–15)
BUN: 41 mg/dL — ABNORMAL HIGH (ref 6–20)
CO2: 29 mmol/L (ref 22–32)
Calcium: 9.6 mg/dL (ref 8.9–10.3)
Chloride: 104 mmol/L (ref 101–111)
Creatinine, Ser: 1.75 mg/dL — ABNORMAL HIGH (ref 0.44–1.00)
GFR calc Af Amer: 37 mL/min — ABNORMAL LOW (ref >60)
GFR calc non Af Amer: 32 mL/min — ABNORMAL LOW (ref >60)
Glucose, Bld: 85 mg/dL (ref 65–99)
Potassium: 3.7 mmol/L (ref 3.5–5.1)
Sodium: 140 mmol/L (ref 135–145)
Total Bilirubin: 0.6 mg/dL (ref 0.3–1.2)
Total Protein: 6.8 g/dL (ref 6.5–8.1)

## 2016-11-19 LAB — CBC WITH DIFFERENTIAL/PLATELET
Basophils Absolute: 0 10*3/uL (ref 0.0–0.1)
Basophils Relative: 1 %
Eosinophils Absolute: 0.1 10*3/uL (ref 0.0–0.7)
Eosinophils Relative: 1 %
HCT: 42.1 % (ref 36.0–46.0)
Hemoglobin: 14 g/dL (ref 12.0–15.0)
Lymphocytes Relative: 30 %
Lymphs Abs: 1.5 10*3/uL (ref 0.7–4.0)
MCH: 32.9 pg (ref 26.0–34.0)
MCHC: 33.3 g/dL (ref 30.0–36.0)
MCV: 99.1 fL (ref 78.0–100.0)
Monocytes Absolute: 0.5 10*3/uL (ref 0.1–1.0)
Monocytes Relative: 10 %
Neutro Abs: 2.9 10*3/uL (ref 1.7–7.7)
Neutrophils Relative %: 58 %
Platelets: 203 10*3/uL (ref 150–400)
RBC: 4.25 MIL/uL (ref 3.87–5.11)
RDW: 13.2 % (ref 11.5–15.5)
WBC: 4.9 10*3/uL (ref 4.0–10.5)

## 2016-11-19 LAB — ABO/RH: ABO/RH(D): O POS

## 2016-11-19 NOTE — Progress Notes (Signed)
CT abd and pelvis 09/23/16 epic

## 2016-11-19 NOTE — Patient Instructions (Signed)
Mandy Shaw  11/19/2016   Your procedure is scheduled on: 11/24/16  Report to [facility] Main  Entrance take [place]  elevators to 3rd floor to  [place] at     [place] AM.  Call this number if you have problems the morning of surgery [phone]   Remember: ONLY 1 PERSON MAY GO WITH YOU TO SHORT STAY TO GET  READY MORNING OF [place].  Do not eat food or drink liquids :After Midnight.     Take these medicines the morning of surgery with A SIP OF WATER:  Cymbalta, pepcid, synthroid, Topiramate, primidone, Lamacital, Imuran                                You may not have any metal on your body including hair pins and              piercings  Do not wear jewelry, make-up, lotions, powders or perfumes, deodorant             Do not wear nail polish.  Do not shave  48 hours prior to surgery               Do not bring valuables to the hospital. [place].  Contacts, dentures or bridgework may not be worn into surgery.  Leave suitcase in the car. After surgery it may be brought to your room.     Patients discharged the day of surgery will not be allowed to drive home.  Name and phone number of your driver:  Special Instructions: N/A              Please read over the following fact sheets you were given: _____________________________________________________________________             [facility] - Preparing for Surgery Before surgery, you can play an important role.  Because skin is not sterile, your skin needs to be as free of germs as possible.  You can reduce the number of germs on your skin by washing with CHG (chlorahexidine gluconate) soap before surgery.  CHG is an antiseptic cleaner which kills germs and bonds with the skin to continue killing germs even after washing. Please DO NOT use if you have an allergy to CHG or antibacterial soaps.  If your skin becomes reddened/irritated stop using the CHG  and inform your nurse when you arrive at Short Stay. Do not shave (including legs and underarms) for at least 48 hours prior to the first CHG shower.  You may shave your face/neck. Please follow these instructions carefully:  1.  Shower with CHG Soap the night before surgery and the  morning of Surgery.  2.  If you choose to wash your hair, wash your hair first as usual with your  normal  shampoo.  3.  After you shampoo, rinse your hair and body thoroughly to remove the  shampoo.                           4.  Use CHG as you would any other liquid soap.  You can apply chg directly  to the skin and wash  Gently with a scrungie or clean washcloth.  5.  Apply the CHG Soap to your body ONLY FROM THE NECK DOWN.   Do not use on face/ open                           Wound or open sores. Avoid contact with eyes, ears mouth and genitals (private parts).                       Wash face,  Genitals (private parts) with your normal soap.             6.  Wash thoroughly, paying special attention to the area where your surgery  will be performed.  7.  Thoroughly rinse your body with warm water from the neck down.  8.  DO NOT shower/wash with your normal soap after using and rinsing off  the CHG Soap.                9.  Pat yourself dry with a clean towel.            10.  Wear clean pajamas.            11.  Place clean sheets on your bed the night of your first shower and do not  sleep with pets. Day of Surgery : Do not apply any lotions/deodorants the morning of surgery.  Please wear clean clothes to the hospital/surgery center.  FAILURE TO FOLLOW THESE INSTRUCTIONS MAY RESULT IN THE CANCELLATION OF YOUR SURGERY PATIENT SIGNATURE_________________________________  NURSE SIGNATURE__________________________________  ________________________________________________________________________  WHAT IS A BLOOD TRANSFUSION? Blood Transfusion Information  A transfusion is the replacement of  blood or some of its parts. Blood is made up of multiple cells which provide different functions.  Red blood cells carry oxygen and are used for blood loss replacement.  White blood cells fight against infection.  Platelets control bleeding.  Plasma helps clot blood.  Other blood products are available for specialized needs, such as hemophilia or other clotting disorders. BEFORE THE TRANSFUSION  Who gives blood for transfusions?   Healthy volunteers who are fully evaluated to make sure their blood is safe. This is blood bank blood. Transfusion therapy is the safest it has ever been in the practice of medicine. Before blood is taken from a donor, a complete history is taken to make sure that person has no history of diseases nor engages in risky social behavior (examples are intravenous drug use or sexual activity with multiple partners). The donor's travel history is screened to minimize risk of transmitting infections, such as malaria. The donated blood is tested for signs of infectious diseases, such as HIV and hepatitis. The blood is then tested to be sure it is compatible with you in order to minimize the chance of a transfusion reaction. If you or a relative donates blood, this is often done in anticipation of surgery and is not appropriate for emergency situations. It takes many days to process the donated blood. RISKS AND COMPLICATIONS Although transfusion therapy is very safe and saves many lives, the main dangers of transfusion include:   Getting an infectious disease.  Developing a transfusion reaction. This is an allergic reaction to something in the blood you were given. Every precaution is taken to prevent this. The decision to have a blood transfusion has been considered carefully by your caregiver before blood is given. Blood is not given unless the benefits outweigh  the risks. AFTER THE TRANSFUSION  Right after receiving a blood transfusion, you will usually feel much better  and more energetic. This is especially true if your red blood cells have gotten low (anemic). The transfusion raises the level of the red blood cells which carry oxygen, and this usually causes an energy increase.  The nurse administering the transfusion will monitor you carefully for complications. HOME CARE INSTRUCTIONS  No special instructions are needed after a transfusion. You may find your energy is better. Speak with your caregiver about any limitations on activity for underlying diseases you may have. SEEK MEDICAL CARE IF:   Your condition is not improving after your transfusion.  You develop redness or irritation at the intravenous (IV) site. SEEK IMMEDIATE MEDICAL CARE IF:  Any of the following symptoms occur over the next 12 hours:  Shaking chills.  You have a temperature by mouth above 102 F (38.9 C), not controlled by medicine.  Chest, back, or muscle pain.  People around you feel you are not acting correctly or are confused.  Shortness of breath or difficulty breathing.  Dizziness and fainting.  You get a rash or develop hives.  You have a decrease in urine output.  Your urine turns a dark color or changes to pink, red, or brown. Any of the following symptoms occur over the next 10 days:  You have a temperature by mouth above 102 F (38.9 C), not controlled by medicine.  Shortness of breath.  Weakness after normal activity.  The white part of the eye turns yellow (jaundice).  You have a decrease in the amount of urine or are urinating less often.  Your urine turns a dark color or changes to pink, red, or brown. Document Released: 09/03/2000 Document Revised: 11/29/2011 Document Reviewed: 04/22/2008 ExitCare Patient Information 2014 [place].  _______________________________________________________________________  Incentive Spirometer  An incentive spirometer is a tool that can help keep your lungs clear and active. This tool measures how well  you are filling your lungs with each breath. Taking long deep breaths may help reverse or decrease the chance of developing breathing (pulmonary) problems (especially infection) following:  A long period of time when you are unable to move or be active. BEFORE THE PROCEDURE   If the spirometer includes an indicator to show your best effort, your nurse or respiratory therapist will set it to a desired goal.  If possible, sit up straight or lean slightly forward. Try not to slouch.  Hold the incentive spirometer in an upright position. INSTRUCTIONS FOR USE  1. Sit on the edge of your bed if possible, or sit up as far as you can in bed or on a chair. 2. Hold the incentive spirometer in an upright position. 3. Breathe out normally. 4. Place the mouthpiece in your mouth and seal your lips tightly around it. 5. Breathe in slowly and as deeply as possible, raising the piston or the ball toward the top of the column. 6. Hold your breath for 3-5 seconds or for as long as possible. Allow the piston or ball to fall to the bottom of the column. 7. Remove the mouthpiece from your mouth and breathe out normally. 8. Rest for a few seconds and repeat Steps 1 through 7 at least 10 times every 1-2 hours when you are awake. Take your time and take a few normal breaths between deep breaths. 9. The spirometer may include an indicator to show your best effort. Use the indicator as a goal to work  toward during each repetition. 10. After each set of 10 deep breaths, practice coughing to be sure your lungs are clear. If you have an incision (the cut made at the time of surgery), support your incision when coughing by placing a pillow or rolled up towels firmly against it. Once you are able to get out of bed, walk around indoors and cough well. You may stop using the incentive spirometer when instructed by your caregiver.  RISKS AND COMPLICATIONS  Take your time so you do not get dizzy or light-headed.  If you are  in pain, you may need to take or ask for pain medication before doing incentive spirometry. It is harder to take a deep breath if you are having pain. AFTER USE  Rest and breathe slowly and easily.  It can be helpful to keep track of a log of your progress. Your caregiver can provide you with a simple table to help with this. If you are using the spirometer at home, follow these instructions: [place] IF:   You are having difficultly using the spirometer.  You have trouble using the spirometer as often as instructed.  Your pain medication is not giving enough relief while using the spirometer.  You develop fever of 100.5 F (38.1 C) or higher. SEEK IMMEDIATE MEDICAL CARE IF:   You cough up bloody sputum that had not been present before.  You develop fever of 102 F (38.9 C) or greater.  You develop worsening pain at or near the incision site. MAKE SURE YOU:   Understand these instructions.  Will watch your condition.  Will get help right away if you are not doing well or get worse. Document Released: 01/17/2007 Document Revised: 11/29/2011 Document Reviewed: 03/20/2007 [facility] Patient Information 2014 [place], [place].   ________________________________________________________________________

## 2016-11-19 NOTE — Progress Notes (Signed)
CMP done 11/19/16 routed to DrMarland Kitchen Gladstone Lighter  Via epic

## 2016-11-22 ENCOUNTER — Encounter: Payer: Self-pay | Admitting: Podiatry

## 2016-11-22 ENCOUNTER — Ambulatory Visit (INDEPENDENT_AMBULATORY_CARE_PROVIDER_SITE_OTHER): Payer: Medicare Other | Admitting: Podiatry

## 2016-11-22 DIAGNOSIS — L600 Ingrowing nail: Secondary | ICD-10-CM | POA: Diagnosis not present

## 2016-11-22 DIAGNOSIS — M79671 Pain in right foot: Secondary | ICD-10-CM | POA: Diagnosis not present

## 2016-11-22 DIAGNOSIS — M79672 Pain in left foot: Secondary | ICD-10-CM | POA: Diagnosis not present

## 2016-11-22 DIAGNOSIS — Q828 Other specified congenital malformations of skin: Secondary | ICD-10-CM | POA: Diagnosis not present

## 2016-11-22 NOTE — Progress Notes (Signed)
SUBJECTIVE: 56 y.o. year old female presents requesting painful nails and calluses trimmed. Stated that Orthotics are doing fine.  She is scheduled to have carpal tunnel surgery next week.   Co existing condition: Myasthenia Gravis, Fibromyalgia.  OBJECTIVE: DERMATOLOGIC EXAMINATION: Deformed painful hallucal nails bilateral. Recurring callus under 5th MPJ right. VASCULAR EXAMINATION OF LOWER LIMBS: All pedal pulses are faintly palpable. Capillary Filling times within 3 seconds in all digits.  Temperature gradient from tibial crest to dorsum of foot is within normal bilateral. NEUROLOGIC EXAMINATION OF THE LOWER LIMBS: Subjective burning pain at dorsum of both feet, gets worse after prolonged ambulation.  Pain at rest in bed or sitting in chair. Some times pain is when the legs are crossed.  Has had failed to respond to Monofilament sensory testing with normal vibratory sensory.  MUSCULOSKELETAL EXAMINATION: High arched cavus type foot bilateral. Plantar flexed 5th metatarsal right.  ASSESSMENT: 1. 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  2. Symptomatic ingrown nails both great toes due to residual nail growth following nail ingrown nail surgery. 3. Peripheral neuropathy. 4. Pain in lower limb.  5. Plantar keratosis under 5th MPJ right foot, symptomatic.  PLAN: Plantar calluses debrided. Ingrown hallucal nails debrided.

## 2016-11-22 NOTE — Patient Instructions (Signed)
Debrided calluses and nails. No new problem noted. Return in 6 weeks.

## 2016-11-23 ENCOUNTER — Ambulatory Visit (INDEPENDENT_AMBULATORY_CARE_PROVIDER_SITE_OTHER): Payer: BLUE CROSS/BLUE SHIELD | Admitting: Physical Therapy

## 2016-11-23 DIAGNOSIS — M6281 Muscle weakness (generalized): Secondary | ICD-10-CM

## 2016-11-23 DIAGNOSIS — M79672 Pain in left foot: Secondary | ICD-10-CM

## 2016-11-23 DIAGNOSIS — M79671 Pain in right foot: Secondary | ICD-10-CM

## 2016-11-23 DIAGNOSIS — R2689 Other abnormalities of gait and mobility: Secondary | ICD-10-CM | POA: Diagnosis not present

## 2016-11-23 DIAGNOSIS — M25551 Pain in right hip: Secondary | ICD-10-CM

## 2016-11-23 DIAGNOSIS — M25552 Pain in left hip: Secondary | ICD-10-CM | POA: Diagnosis not present

## 2016-11-23 NOTE — Therapy (Addendum)
[facility] [place], [place], [address] Phone: [phone]   Fax:  [phone]  Physical Therapy Treatment  Patient Details  Name: Mandy Shaw MRN: [id number] Date of Birth: 1961/08/12 Referring Provider: Dr Dianah Field  Encounter Date: 11/23/2016      PT End of Session - 11/23/16 0846    Visit Number 3   Number of Visits 6   Date for PT Re-Evaluation 12/22/16   PT Start Time 0846  on heat at end of session   PT Stop Time 0935   PT Time Calculation (min) 49 min      Past Medical History:  Diagnosis Date  . Anxiety   . Arthritis   . Bipolar 1 disorder ([place])   . Chronic kidney disease    stage 3 kidney disease  . COPD (chronic obstructive pulmonary disease) ([place])   . Dyspnea   . GERD (gastroesophageal reflux disease)   . Hypothyroidism   . Insomnia   . Myasthenia gravis ([place])   . 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 Personality disorder   . Thyroid disease     Past Surgical History:  Procedure Laterality Date  . CARDIAC SURGERY     flap on heart removed  . CESAREAN SECTION    . CHOLECYSTECTOMY    . CONDYLOMA EXCISION/FULGURATION    . EYE SURGERY     bil cataracts  . INCONTINENCE SURGERY    . INDUCED ABORTION    . LAPAROTOMY     exploratory for pain  . LEEP    . Left hip surgery  01/09/14   Dr. Arnette Norris  . right hip surgery  09/26/2013   Dr. Arnette Norris  . SHOULDER SURGERY Right 2015   Dr. Margretta Sidle  . THYROIDECTOMY    . vaginal mesh     x 2    There were no vitals filed for this visit.      Subjective Assessment - 11/23/16 0849    Subjective Sumi reports she hasn't been doing any exercises because she was feeling sick, she stopped the antibiotic and still felt sick until Saturday.  Going to see a pain specialist 3/12. Also having carpal tunnel surgery tomorrow.    Patient Stated Goals trying PT to see if it helps and if she has any deficits    Currently in Pain? Yes  no pain in her hips today   Pain Score 6    Pain Location Knee   Pain Orientation Left;Right   Pain Descriptors / Indicators Aching   Pain Type Chronic pain   Pain Onset More than a month ago   Pain Frequency Constant   Aggravating Factors  walking, weather   Pain Relieving Factors nothing                         [facility] Adult PT Treatment/Exercise - 11/23/16 0001      Knee/Hip Exercises: Stretches   Other Knee/Hip Stretches 30 sec each SKTC , knee to opposite shoulder   Other Knee/Hip Stretches 3x10 sec leg lengtheners     Knee/Hip Exercises: Aerobic   Nustep L4x6' VC for form and good alignment     Knee/Hip Exercises: Standing   Other Standing Knee Exercises 2x10 sit to/from stand single with opposite toe for balance     Knee/Hip Exercises: Supine   Other Supine Knee/Hip Exercises 25 reps pelvic rocks with TA contractions  Knee/Hip Exercises: Prone   Hamstring Curl 2 sets;10 reps  4# on each leg   Hip Extension Strengthening;Both;2 sets;10 reps  lifting both legs at the same time     Modalities   Modalities Moist Heat     Moist Heat Therapy   Number Minutes Moist Heat 15 Minutes   Moist Heat Location Lumbar Spine;Knee  and bilat knees                      PT Long Term Goals - 11/23/16 9924      PT LONG TERM GOAL #1   Title I with advanced HEP for hip and core ( 12/08/16)     Status On-going     PT LONG TERM GOAL #2   Title report overall pain reduction in her hip  =/> 50% ( 12/08/16)    Status On-going     PT LONG TERM GOAL #3   Title increase bilat strength in hips =/> 5-/5 ( 12/08/16)    Status On-going     PT LONG TERM GOAL #4   Title improve FOTO =/< 45% limited ( 12/08/16)    Status On-going     PT LONG TERM GOAL #5   Title NA               Plan - 11/23/16 0909    Clinical Impression Statement Sumi had signifcant pain and spasms after prone ex in bilat glut meds when she rolled on to  her back from prone. No goals met, she is having surgery on her wrist tomorrow and then seeing a pain specialist on Monday.    Rehab Potential Fair   PT Frequency 1x / week   PT Duration 6 weeks   PT Treatment/Interventions Moist Heat;Ultrasound;Therapeutic exercise;Dry needling;Taping;Manual techniques;Neuromuscular re-education;Cryotherapy;Electrical Stimulation;Iontophoresis 72m/ml Dexamethasone;Patient/family education   PT Next Visit Plan hip and core strengthening    Consulted and Agree with Plan of Care Patient      Patient will benefit from skilled therapeutic intervention in order to improve the following deficits and impairments:  Impaired sensation, Decreased strength, Pain, Impaired perceived functional ability  Visit Diagnosis: Muscle weakness (generalized)  Pain in right hip  Pain in left hip  Pain in both feet  Other abnormalities of gait and mobility     Problem List Patient Active Problem List   Diagnosis Date Noted  . Constipation 11/09/2016  . Aortic atherosclerosis ([place] 09/23/2016  . Fibromyalgia 09/06/2016  . Carpal tunnel syndrome, bilateral 08/09/2016  . Bilateral hip pain 08/09/2016  . Trigger finger, left 04/15/2016  . Underweight 10/22/2015  . Malnutrition, calorie ([place] 10/22/2015  . Pain in lower limb 09/17/2015  . Microalbuminuria 08/22/2015  . Former smoker 09/17/2014  . Insomnia 04/09/2014  . 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 Hypothyroidism 07/05/2013  . Herpes 09/05/2012  . Hyperlipidemia 06/05/2010  . Bipolar disorder ([place] 08/22/2009  . BORDERLINE PERSONALITY DISORDER 08/22/2009  . PTSD 08/22/2009  . Migraine headache 08/22/2009  . KNEE PAIN 08/22/2009  . COPD (chronic obstructive pulmonary disease) with chronic bronchitis ([place] 07/23/2009  . RHEUMATOID ARTHRITIS 07/23/2009    SJeral PinchPT  11/23/2016, [address] [place] [address]Phone: [phone]  Fax:  [phone] Name: SLIAHNA BRICKNERMRN: [id number] of Birth: [birth year] PHYSICAL THERAPY DISCHARGE SUMMARY  Visits from Start of Care: 3  Current functional level related to goals / functional outcomes: Unknown, patient had carpal tunnel release performed and has not returned for treatment of her hip   Remaining deficits: unknown   Education / Equipment: HEP Plan:                                                    Patient goals were not met. Patient is being discharged due to not returning since the last visit.  ?????    Jeral Pinch, PT 12/30/16 9:06 AM

## 2016-11-24 ENCOUNTER — Ambulatory Visit (HOSPITAL_COMMUNITY)
Admission: RE | Admit: 2016-11-24 | Discharge: 2016-11-24 | Disposition: A | Discharge status: Home / Self Care | Payer: BLUE CROSS/BLUE SHIELD | Source: Ambulatory Visit | Attending: Orthopedic Surgery | Admitting: Orthopedic Surgery

## 2016-11-24 ENCOUNTER — Ambulatory Visit (HOSPITAL_COMMUNITY): Payer: BLUE CROSS/BLUE SHIELD | Admitting: Anesthesiology

## 2016-11-24 ENCOUNTER — Encounter (HOSPITAL_COMMUNITY): Payer: Self-pay | Admitting: Anesthesiology

## 2016-11-24 ENCOUNTER — Encounter (HOSPITAL_COMMUNITY)
Admission: RE | Disposition: A | Discharge status: Home / Self Care | Payer: Self-pay | Source: Ambulatory Visit | Attending: Orthopedic Surgery

## 2016-11-24 DIAGNOSIS — M069 Rheumatoid arthritis, unspecified: Secondary | ICD-10-CM | POA: Diagnosis not present

## 2016-11-24 DIAGNOSIS — J449 Chronic obstructive pulmonary disease, unspecified: Secondary | ICD-10-CM | POA: Insufficient documentation

## 2016-11-24 DIAGNOSIS — Z880 Allergy status to penicillin: Secondary | ICD-10-CM | POA: Diagnosis not present

## 2016-11-24 DIAGNOSIS — Z87891 Personal history of nicotine dependence: Secondary | ICD-10-CM | POA: Diagnosis not present

## 2016-11-24 DIAGNOSIS — M19011 Primary osteoarthritis, right shoulder: Secondary | ICD-10-CM | POA: Insufficient documentation

## 2016-11-24 DIAGNOSIS — G5601 Carpal tunnel syndrome, right upper limb: Secondary | ICD-10-CM | POA: Insufficient documentation

## 2016-11-24 DIAGNOSIS — Z91018 Allergy to other foods: Secondary | ICD-10-CM | POA: Diagnosis not present

## 2016-11-24 DIAGNOSIS — G7000 Myasthenia gravis without (acute) exacerbation: Secondary | ICD-10-CM | POA: Insufficient documentation

## 2016-11-24 DIAGNOSIS — F319 Bipolar disorder, unspecified: Secondary | ICD-10-CM | POA: Diagnosis not present

## 2016-11-24 DIAGNOSIS — I739 Peripheral vascular disease, unspecified: Secondary | ICD-10-CM | POA: Insufficient documentation

## 2016-11-24 DIAGNOSIS — F609 Personality disorder, unspecified: Secondary | ICD-10-CM | POA: Insufficient documentation

## 2016-11-24 DIAGNOSIS — Z881 Allergy status to other antibiotic agents status: Secondary | ICD-10-CM | POA: Insufficient documentation

## 2016-11-24 DIAGNOSIS — K219 Gastro-esophageal reflux disease without esophagitis: Secondary | ICD-10-CM | POA: Diagnosis not present

## 2016-11-24 DIAGNOSIS — F419 Anxiety disorder, unspecified: Secondary | ICD-10-CM | POA: Insufficient documentation

## 2016-11-24 DIAGNOSIS — Z79899 Other long term (current) drug therapy: Secondary | ICD-10-CM | POA: Insufficient documentation

## 2016-11-24 DIAGNOSIS — E890 Postprocedural hypothyroidism: Secondary | ICD-10-CM | POA: Diagnosis not present

## 2016-11-24 DIAGNOSIS — Z882 Allergy status to sulfonamides status: Secondary | ICD-10-CM | POA: Insufficient documentation

## 2016-11-24 DIAGNOSIS — M21371 Foot drop, right foot: Secondary | ICD-10-CM | POA: Diagnosis not present

## 2016-11-24 DIAGNOSIS — Z9049 Acquired absence of other specified parts of digestive tract: Secondary | ICD-10-CM | POA: Insufficient documentation

## 2016-11-24 DIAGNOSIS — N183 Chronic kidney disease, stage 3 (moderate): Secondary | ICD-10-CM | POA: Insufficient documentation

## 2016-11-24 DIAGNOSIS — Z888 Allergy status to other drugs, medicaments and biological substances status: Secondary | ICD-10-CM | POA: Insufficient documentation

## 2016-11-24 DIAGNOSIS — G4700 Insomnia, unspecified: Secondary | ICD-10-CM | POA: Diagnosis not present

## 2016-11-24 HISTORY — PX: CARPAL TUNNEL RELEASE: SHX101

## 2016-11-24 HISTORY — DX: Foot drop, right foot: M21.371

## 2016-11-24 SURGERY — CARPAL TUNNEL RELEASE
Anesthesia: General | Site: Hand | Laterality: Right

## 2016-11-24 MED ORDER — LIDOCAINE 2% (20 MG/ML) 5 ML SYRINGE
INTRAMUSCULAR | Status: AC
  Filled 2016-11-24: qty 5

## 2016-11-24 MED ORDER — BUPIVACAINE HCL 0.5 % IJ SOLN
INTRAMUSCULAR | Status: DC | PRN
  Administered 2016-11-24: 7 mL

## 2016-11-24 MED ORDER — ACETAMINOPHEN 650 MG RE SUPP
650.0000 mg | RECTAL | Status: DC | PRN
  Filled 2016-11-24: qty 1

## 2016-11-24 MED ORDER — FENTANYL CITRATE (PF) 100 MCG/2ML IJ SOLN
INTRAMUSCULAR | Status: AC
  Filled 2016-11-24: qty 2

## 2016-11-24 MED ORDER — FENTANYL CITRATE (PF) 100 MCG/2ML IJ SOLN
INTRAMUSCULAR | Status: DC | PRN
  Administered 2016-11-24 (×2): 25 ug via INTRAVENOUS

## 2016-11-24 MED ORDER — SODIUM CHLORIDE 0.9 % IV SOLN: 250.0000 mL | INTRAVENOUS | Status: DC | PRN

## 2016-11-24 MED ORDER — SODIUM CHLORIDE 0.9 % IJ SOLN
INTRAMUSCULAR | Status: AC
  Filled 2016-11-24: qty 10

## 2016-11-24 MED ORDER — PROMETHAZINE HCL 25 MG/ML IJ SOLN
6.2500 mg | INTRAMUSCULAR | Status: DC | PRN
  Administered 2016-11-24: 6.25 mg via INTRAVENOUS

## 2016-11-24 MED ORDER — ACETAMINOPHEN 325 MG PO TABS: 650.0000 mg | ORAL_TABLET | ORAL | Status: DC | PRN

## 2016-11-24 MED ORDER — BUPIVACAINE HCL (PF) 0.5 % IJ SOLN
INTRAMUSCULAR | Status: AC
  Filled 2016-11-24: qty 30

## 2016-11-24 MED ORDER — PROPOFOL 10 MG/ML IV BOLUS
INTRAVENOUS | Status: AC
  Filled 2016-11-24: qty 20

## 2016-11-24 MED ORDER — SODIUM CHLORIDE 0.9 % IR SOLN
Status: DC | PRN
  Administered 2016-11-24: 500 mL

## 2016-11-24 MED ORDER — FENTANYL CITRATE (PF) 100 MCG/2ML IJ SOLN
25.0000 ug | INTRAMUSCULAR | Status: DC | PRN
  Administered 2016-11-24: 50 ug via INTRAVENOUS

## 2016-11-24 MED ORDER — OXYCODONE HCL 5 MG PO TABS
5.0000 mg | ORAL_TABLET | ORAL | Status: DC | PRN
  Administered 2016-11-24: 5 mg via ORAL
  Filled 2016-11-24: qty 1

## 2016-11-24 MED ORDER — PROPOFOL 10 MG/ML IV BOLUS
INTRAVENOUS | Status: DC | PRN
  Administered 2016-11-24: 200 mg via INTRAVENOUS

## 2016-11-24 MED ORDER — SODIUM CHLORIDE 0.9% FLUSH: 3.0000 mL | Freq: Two times a day (BID) | INTRAVENOUS | Status: DC

## 2016-11-24 MED ORDER — PROMETHAZINE HCL 25 MG/ML IJ SOLN
INTRAMUSCULAR | Status: AC
  Filled 2016-11-24: qty 1

## 2016-11-24 MED ORDER — VANCOMYCIN HCL IN DEXTROSE 1-5 GM/200ML-% IV SOLN
INTRAVENOUS | Status: AC
  Filled 2016-11-24: qty 200

## 2016-11-24 MED ORDER — ONDANSETRON HCL 4 MG/2ML IJ SOLN
INTRAMUSCULAR | Status: AC
  Filled 2016-11-24: qty 2

## 2016-11-24 MED ORDER — BACITRACIN-NEOMYCIN-POLYMYXIN 400-5-5000 EX OINT
TOPICAL_OINTMENT | CUTANEOUS | Status: AC
  Filled 2016-11-24: qty 1

## 2016-11-24 MED ORDER — MIDAZOLAM HCL 2 MG/2ML IJ SOLN
INTRAMUSCULAR | Status: AC
  Filled 2016-11-24: qty 2

## 2016-11-24 MED ORDER — SODIUM CHLORIDE 0.9 % IR SOLN
Status: AC
  Filled 2016-11-24: qty 500000

## 2016-11-24 MED ORDER — LACTATED RINGERS IV SOLN: INTRAVENOUS | Status: DC

## 2016-11-24 MED ORDER — MIDAZOLAM HCL 5 MG/5ML IJ SOLN
INTRAMUSCULAR | Status: DC | PRN
  Administered 2016-11-24: 2 mg via INTRAVENOUS

## 2016-11-24 MED ORDER — DEXAMETHASONE SODIUM PHOSPHATE 10 MG/ML IJ SOLN
INTRAMUSCULAR | Status: DC | PRN
  Administered 2016-11-24: 10 mg via INTRAVENOUS

## 2016-11-24 MED ORDER — VANCOMYCIN HCL IN DEXTROSE 1-5 GM/200ML-% IV SOLN
1000.0000 mg | INTRAVENOUS | Status: AC
  Administered 2016-11-24: 1000 mg via INTRAVENOUS
  Filled 2016-11-24: qty 200

## 2016-11-24 MED ORDER — DEXAMETHASONE SODIUM PHOSPHATE 10 MG/ML IJ SOLN
INTRAMUSCULAR | Status: AC
  Filled 2016-11-24: qty 1

## 2016-11-24 MED ORDER — LACTATED RINGERS IV SOLN
INTRAVENOUS | Status: DC
  Administered 2016-11-24: 08:00:00 via INTRAVENOUS

## 2016-11-24 MED ORDER — LIDOCAINE 2% (20 MG/ML) 5 ML SYRINGE
INTRAMUSCULAR | Status: DC | PRN
  Administered 2016-11-24: 75 mg via INTRAVENOUS

## 2016-11-24 MED ORDER — SODIUM CHLORIDE 0.9% FLUSH: 3.0000 mL | INTRAVENOUS | Status: DC | PRN

## 2016-11-24 SURGICAL SUPPLY — 31 items
BAG ZIPLOCK 12X15 (MISCELLANEOUS) ×2 IMPLANT
BANDAGE ACE 3X5.8 VEL STRL LF (GAUZE/BANDAGES/DRESSINGS) ×2 IMPLANT
BANDAGE ACE 4X5 VEL STRL LF (GAUZE/BANDAGES/DRESSINGS) ×2 IMPLANT
BLADE SURG 15 STRL LF DISP TIS (BLADE) ×1 IMPLANT
BLADE SURG 15 STRL SS (BLADE) ×1
BNDG CONFORM 3 STRL LF (GAUZE/BANDAGES/DRESSINGS) ×2 IMPLANT
CUFF TOURN SGL QUICK 18 (TOURNIQUET CUFF) ×2 IMPLANT
DECANTER SPIKE VIAL GLASS SM (MISCELLANEOUS) ×2 IMPLANT
DRSG EMULSION OIL 3X3 NADH (GAUZE/BANDAGES/DRESSINGS) ×2 IMPLANT
DURAPREP 26ML APPLICATOR (WOUND CARE) ×2 IMPLANT
ELECT REM PT RETURN 9FT ADLT (ELECTROSURGICAL) ×2
ELECTRODE REM PT RTRN 9FT ADLT (ELECTROSURGICAL) ×1 IMPLANT
GAUZE SPONGE 4X4 12PLY STRL (GAUZE/BANDAGES/DRESSINGS) ×2 IMPLANT
GAUZE SPONGE 4X4 16PLY XRAY LF (GAUZE/BANDAGES/DRESSINGS) IMPLANT
GLOVE BIOGEL PI IND STRL 8 (GLOVE) ×1 IMPLANT
GLOVE BIOGEL PI INDICATOR 8 (GLOVE) ×1
GLOVE ECLIPSE 8.0 STRL XLNG CF (GLOVE) ×4 IMPLANT
GOWN STRL REUS W/TWL LRG LVL3 (GOWN DISPOSABLE) ×4 IMPLANT
GOWN STRL REUS W/TWL XL LVL3 (GOWN DISPOSABLE) ×2 IMPLANT
KIT BASIN OR (CUSTOM PROCEDURE TRAY) ×2 IMPLANT
MANIFOLD NEPTUNE II (INSTRUMENTS) ×2 IMPLANT
NS IRRIG 1000ML POUR BTL (IV SOLUTION) ×2 IMPLANT
PACK ORTHO EXTREMITY (CUSTOM PROCEDURE TRAY) ×2 IMPLANT
PAD ABD 8X10 STRL (GAUZE/BANDAGES/DRESSINGS) ×2 IMPLANT
PAD CAST 3X4 CTTN HI CHSV (CAST SUPPLIES) ×1 IMPLANT
PADDING CAST COTTON 3X4 STRL (CAST SUPPLIES) ×1
POSITIONER SURGICAL ARM (MISCELLANEOUS) ×2 IMPLANT
SUT ETHILON 3 0 PS 1 (SUTURE) ×2 IMPLANT
SYR CONTROL 10ML LL (SYRINGE) ×2 IMPLANT
TOWEL OR 17X26 10 PK STRL BLUE (TOWEL DISPOSABLE) ×4 IMPLANT
WATER STERILE IRR 1500ML POUR (IV SOLUTION) ×2 IMPLANT

## 2016-11-24 NOTE — Progress Notes (Signed)
Dr. Kalman Shan made aware of patient's blood pressures in PACU- patient may go to Short Stay with systolic blood pressure of 80 - per Dr. Kalman Shan

## 2016-11-24 NOTE — Interval H&P Note (Signed)
History and Physical Interval Note:  11/24/2016 8:23 AM  [place]  has presented today for surgery, with the diagnosis of RIGHT CARPAL TUNNEL SYNDROME  The various methods of treatment have been discussed with the patient and family. After consideration of risks, benefits and other options for treatment, the patient has consented to  Procedure(s): RIGHT CARPAL TUNNEL RELEASE (Right) as a surgical intervention .  The patient's history has been reviewed, patient examined, no change in status, stable for surgery.  I have reviewed the patient's chart and labs.  Questions were answered to the patient's satisfaction.     Zilda No A

## 2016-11-24 NOTE — Transfer of Care (Signed)
Immediate Anesthesia Transfer of Care Note  Patient: Mandy Shaw  Procedure(s) Performed: Procedure(s): RIGHT CARPAL TUNNEL RELEASE (Right)  Patient Location: PACU  Anesthesia Type:General  Level of Consciousness: sedated  Airway & Oxygen Therapy: Patient Spontanous Breathing and Patient connected to face mask oxygen  Post-op Assessment: Report given to RN and Post -op Vital signs reviewed and stable  Post vital signs: Reviewed and stable  Last Vitals:  Vitals:   11/24/16 0612 11/24/16 0928  BP: 99/62 (!) 80/63  Pulse: 72 71  Resp: 16   Temp: 36.6 C     Last Pain:  Vitals:   11/24/16 0639  TempSrc:   PainSc: 6       Patients Stated Pain Goal: 5 (41/28/78 6767)  Complications: No apparent anesthesia complications

## 2016-11-24 NOTE — Progress Notes (Signed)
Called Dr. Rushie Nyhan patient unable to take RX for home pain med. Dr. Aletha Halim have RX for patient to pick up in office upon discharge from hospital. Informed daughter to pick up Rx.

## 2016-11-24 NOTE — Anesthesia Procedure Notes (Signed)
Procedure Name: LMA Insertion Date/Time: 11/24/2016 8:34 AM Performed by: Danley Danker L Patient Re-evaluated:Patient Re-evaluated prior to inductionOxygen Delivery Method: Circle system utilized Preoxygenation: Pre-oxygenation with 100% oxygen Intubation Type: IV induction Ventilation: Mask ventilation without difficulty LMA: LMA inserted LMA Size: 4.0 Number of attempts: 1 Placement Confirmation: positive ETCO2 and breath sounds checked- equal and bilateral Tube secured with: Tape Dental Injury: Teeth and Oropharynx as per pre-operative assessment

## 2016-11-24 NOTE — Discharge Instructions (Signed)

## 2016-11-24 NOTE — Op Note (Signed)
[name], Mandy Shaw                 ACCOUNT NO.:  [id number]  MEDICAL RECORD NO.:  [id number]  LOCATION:                                 FACILITY:  PHYSICIAN:  Kipp Brood. Alyaan Budzynski, M.D.DATE OF BIRTH:  [birth year]  DATE OF PROCEDURE:  11/24/2016 DATE OF DISCHARGE:                              OPERATIVE REPORT   SURGEON:  Kipp Brood. Gladstone Lighter, M.D.  ASSISTANT:  Nurse.  PREOPERATIVE DIAGNOSES: 1. Severe right carpal tunnel syndrome on the right. 2. Myasthenia gravis.  POSTOPERATIVE DIAGNOSES: 1. Severe right carpal tunnel syndrome on the right. 2. Myasthenia gravis.  OPERATION:  Right carpal tunnel release.  PROCEDURE:  I first notified Anesthesia, let them note that this lady had myasthenia gravis and all precautions were taken.  At this time, sterile prep and draping of the right upper extremity was carried out. The appropriate time-out was carried out, also marked the appropriate right arm in the holding area.  The right arm was exsanguinated. Esmarch tourniquet was elevated at 250 mmHg.  At this time, a lazy-S incision was made over the carpal tunnel region on the right.  Bleeders were cauterized with the bipolar.  I then went down and identified the palmar fascia, incised that and then identified the deep and superficial transverse carpal ligament.  I made a small incision in the deep transverse carpal ligament.  I then inserted a Freer, protected the sciatic nerve at all times, and I released the carpal tunnel proximally and distally.  We had a good release.  I thoroughly irrigated out the area and then closed the skin only with 3-0 nylon suture.  Sterile Neosporin bundle dressing was applied.  She had 1 g of IV Ancef preop. Postop, she will see me in the office in 2 days or prior to, if there is a problem.  We will check her for a dressing change.  She was given Percocet 5/325 for pain.  I also during the procedure injected about 10 mL of 0.5% plain Marcaine.     ______________________________ Kipp Brood Gladstone Lighter, M.D.     RAG/MEDQ  D:  11/24/2016  T:  11/24/2016  Job:  [id number]

## 2016-11-24 NOTE — Anesthesia Postprocedure Evaluation (Addendum)
Anesthesia Post Note  Patient: DEJAI SCHUBACH  Procedure(s) Performed: Procedure(s) (LRB): RIGHT CARPAL TUNNEL RELEASE (Right)  Patient location during evaluation: PACU Anesthesia Type: General Level of consciousness: awake and alert Pain management: pain level controlled Vital Signs Assessment: post-procedure vital signs reviewed and stable Respiratory status: spontaneous breathing, nonlabored ventilation, respiratory function stable and patient connected to nasal cannula oxygen Cardiovascular status: blood pressure returned to baseline and stable Postop Assessment: no signs of nausea or vomiting Anesthetic complications: no       Last Vitals:  Vitals:   11/24/16 1000 11/24/16 1009  BP: 101/70   Pulse: 69 71  Resp: 12 19  Temp:      Last Pain:  Vitals:   11/24/16 1009  TempSrc:   PainSc: 2                  Teja Judice S

## 2016-11-24 NOTE — Anesthesia Preprocedure Evaluation (Signed)
Anesthesia Evaluation  Patient identified by MRN, date of birth, ID band Patient awake    Reviewed: Allergy & Precautions, NPO status , Patient's Chart, lab work & pertinent test results  Airway Mallampati: II  TM Distance: >3 FB Neck ROM: Full    Dental no notable dental hx.    Pulmonary COPD, former smoker,    Pulmonary exam normal breath sounds clear to auscultation       Cardiovascular + Peripheral Vascular Disease  Normal cardiovascular exam Rhythm:Regular Rate:Normal     Neuro/Psych MG  Neuromuscular disease negative psych ROS   GI/Hepatic negative GI ROS, Neg liver ROS,   Endo/Other  Hypothyroidism   Renal/GU negative Renal ROS  negative genitourinary   Musculoskeletal  (+) Arthritis , Rheumatoid disorders,    Abdominal   Peds negative pediatric ROS (+)  Hematology negative hematology ROS (+)   Anesthesia Other Findings   Reproductive/Obstetrics negative OB ROS                             Anesthesia Physical Anesthesia Plan  ASA: III  Anesthesia Plan: General   Post-op Pain Management:    Induction: Intravenous  Airway Management Planned: LMA  Additional Equipment:   Intra-op Plan:   Post-operative Plan:   Informed Consent: I have reviewed the patients History and Physical, chart, labs and discussed the procedure including the risks, benefits and alternatives for the proposed anesthesia with the patient or authorized representative who has indicated his/her understanding and acceptance.   Dental advisory given  Plan Discussed with: CRNA and Surgeon  Anesthesia Plan Comments:         Anesthesia Quick Evaluation

## 2016-11-24 NOTE — Brief Op Note (Signed)
11/24/2016  9:21 AM  PATIENT:  Mandy Shaw  56 y.o. female  PRE-OPERATIVE DIAGNOSIS:  RIGHT CARPAL TUNNEL SYNDROME  POST-OPERATIVE DIAGNOSIS:  RIGHT CARPAL TUNNEL SYNDROME  PROCEDURE:  Procedure(s): RIGHT CARPAL TUNNEL RELEASE (Right)  SURGEON:  Surgeon(s) and Role:    * Latanya Maudlin, MD - Primary  PHYSICIAN ASSISTANT: Nurse  ASSISTANTS: Nurse  ANESTHESIA:   general  EBL:  No intake/output data recorded.  BLOOD ADMINISTERED:none  DRAINS: none   LOCAL MEDICATIONS USED:  MARCAINE  10cc of 0.50% Plain   SPECIMEN:  No Specimen  DISPOSITION OF SPECIMEN:  N/A  COUNTS:  YES  TOURNIQUET:   Total Tourniquet Time Documented: Upper Arm (Right) - 21 minutes Total: Upper Arm (Right) - 21 minutes   DICTATION: .Other Dictation: Dictation Number [id number]  PLAN OF CARE: Discharge to home after PACU  PATIENT DISPOSITION:  PACU - hemodynamically stable.   Delay start of Pharmacological VTE agent (>24hrs) due to surgical blood loss or risk of bleeding: yes

## 2016-11-24 NOTE — H&P (Signed)
Mandy Shaw is an 56 y.o. female.   Chief Complaint: Pain and numbnes in both hands,but worse in the Right Hand HPI: She has progressive pain and numbness in both hands and has had a Positive neurological test for Carpal Syndrome.  Past Medical History:  Diagnosis Date  . Anxiety   . Arthritis   . Bipolar 1 disorder ([place])   . Chronic kidney disease    stage 3 kidney disease  . COPD (chronic obstructive pulmonary disease) ([place])   . Dyspnea   . Foot drop, right foot   . GERD (gastroesophageal reflux disease)   . Hypothyroidism   . Insomnia   . Myasthenia gravis ([place])   . 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 Personality disorder   . Thyroid disease     Past Surgical History:  Procedure Laterality Date  . CARDIAC SURGERY     flap on heart removed  . CESAREAN SECTION    . CHOLECYSTECTOMY    . CONDYLOMA EXCISION/FULGURATION    . EYE SURGERY     bil cataracts  . INCONTINENCE SURGERY    . INDUCED ABORTION    . LAPAROTOMY     exploratory for pain  . LEEP    . Left hip surgery  01/09/14   Dr. Arnette Norris  . right hip surgery  09/26/2013   Dr. Arnette Norris  . SHOULDER SURGERY Right 2015   Dr. Margretta Sidle  . THYROIDECTOMY    . vaginal mesh     x 2    Family History  Problem Relation Age of Onset  . Alcohol abuse Father    Social History:  reports that she quit smoking about 13 months ago. Her smoking use included Cigarettes. She has a 60.00 pack-year smoking history. She has never used smokeless tobacco. She reports that she drinks alcohol. She reports that she does not use drugs.  Allergies:  Allergies  Allergen Reactions  . Albuterol Anaphylaxis  . Other Anaphylaxis    MANGO, PARSLEY.  [name] Seed Anaphylaxis  . Penicillins Anaphylaxis    Has patient had a PCN reaction causing immediate rash, facial/tongue/throat swelling, SOB or lightheadedness with hypotension: Yes Has patient had a PCN reaction causing severe rash involving mucus  membranes or skin necrosis: Yes Has patient had a PCN reaction that required hospitalization No Has patient had a PCN reaction occurring within the last 10 years: No If all of the above answers are "NO", then may proceed with Cephalosporin use.   . Silica [Nutritional Supplements] Itching  . Sulfonamide Derivatives Anaphylaxis  . Linzess [Linaclotide] Other (See Comments)    dizziness  . Macrodantin [Nitrofurantoin Macrocrystal] Itching  . Neurontin [Gabapentin] Other (See Comments)    Unable to walk or talk   . Pregabalin Other (See Comments)    dizziness    Medications Prior to Admission  Medication Sig Dispense Refill  . ARIPiprazole (ABILIFY) 20 MG tablet Take 1 tablet (20 mg total) by mouth daily. (Patient taking differently: Take 10 mg by mouth daily. ) 30 tablet 0  . azaTHIOprine (IMURAN) 50 MG tablet Take 50-100 mg by mouth See admin instructions. Takes 50 mg in the morning and 100 mg at night    . Calcium Carb-Cholecalciferol (CALCIUM 600 + D PO) Take 1 tablet by mouth 2 (two) times daily.     Marland Kitchen docusate sodium (COLACE) 100 MG capsule Take 300 mg by mouth at bedtime.    . DULoxetine (CYMBALTA) 60 MG capsule  Take 2 capsules (120 mg total) by mouth daily. 60 capsule 1  . famotidine (PEPCID) 20 MG tablet take 1 tablet by mouth daily 30 tablet 6  . hydrochlorothiazide (HYDRODIURIL) 25 MG tablet Take 25 mg by mouth 2 (two) times daily.  1  . ipratropium (ATROVENT HFA) 17 MCG/ACT inhaler Inhale 2 puffs into the lungs every 6 (six) hours as needed for wheezing. 1 Inhaler 2  . lamoTRIgine (LAMICTAL) 200 MG tablet Take 1 tablet (200 mg total) by mouth daily. One per day. 30 tablet 1  . levothyroxine (SYNTHROID, LEVOTHROID) 125 MCG tablet take 1 tablet by mouth once daily 30 tablet 3  . mirtazapine (REMERON) 7.5 MG tablet Take 1 tablet (7.5 mg total) by mouth at bedtime. One at night 30 tablet 1  . Multiple Vitamins-Minerals (CENTRUM SILVER ADULT 50+ PO) Take 1 capsule by mouth daily.     . potassium chloride (K-DUR) 10 MEQ tablet take 1 tablet by mouth once daily    . primidone (MYSOLINE) 50 MG tablet Take 50 mg by mouth 2 (two) times daily.   0  . rizatriptan (MAXALT) 10 MG tablet take 1 tablet by mouth AT ONSET OF HEADACHE. may repeat after 2 h...  (REFER TO PRESCRIPTION NOTES).  0  . senna (SENOKOT) 8.6 MG TABS tablet Take 2 tablets by mouth at bedtime.    Marland Kitchen tiZANidine (ZANAFLEX) 4 MG tablet Take 4 mg by mouth every 6 (six) hours as needed for muscle spasms.    . Topiramate ER (TROKENDI XR) 200 MG CP24 Take 200 mg by mouth daily.    . traMADol (ULTRAM) 50 MG tablet 1-2 tabs by mouth Q8 hours, maximum 6 tabs per day. (Patient taking differently: Take 50 mg by mouth 2 (two) times daily. maximum 6 tabs per day.) 20 tablet 0  . traZODone (DESYREL) 150 MG tablet Take 1 tablet (150 mg total) by mouth at bedtime. Please discontinue all previous prescriptions. 30 tablet 1  . capsicum (ZOSTRIX) 0.075 % topical cream Apply 1 application topically 2 (two) times daily. (Patient not taking: Reported on 11/15/2016) 28.3 g 0  . levofloxacin (LEVAQUIN) 750 MG tablet Take 1 tablet (750 mg total) by mouth every other day. (Patient not taking: Reported on 11/10/2016) 5 tablet 0  . pregabalin (LYRICA) 50 MG capsule 1-2 capsules daily (Patient not taking: Reported on 11/10/2016) 63 capsule 0  . simvastatin (ZOCOR) 20 MG tablet take 1 tablet by mouth ONCE DAILY; PLEASE SEE PROVIDER FOR LIPID PANEL (Patient not taking: Reported on 11/15/2016) 90 tablet 3    No results found for this or any previous visit (from the past 48 hour(s)). No results found.  Review of Systems  Constitutional: Positive for malaise/fatigue.  HENT: Negative.   Eyes: Negative.   Respiratory: Negative.   Cardiovascular: Negative.   Gastrointestinal: Negative.   Genitourinary: Negative.   Musculoskeletal: Positive for myalgias.  Skin: Negative.   Neurological: Positive for focal weakness.       History of Myasthenia Gravis   Endo/Heme/Allergies: Negative.   Psychiatric/Behavioral: Negative.     Blood pressure 99/62, pulse 72, temperature 97.9 F (36.6 C), temperature source Oral, resp. rate 16, last menstrual period 06/20/2014, SpO2 100 %. Physical Exam  Constitutional: She appears well-developed.  HENT:  Head: Normocephalic.  Eyes: Pupils are equal, round, and reactive to light.  Neck: Normal range of motion.  Cardiovascular: Normal rate.   Respiratory: Effort normal.  GI: Soft.  Musculoskeletal:  Generalized weakness secondary to Myasthenia Gravis.  Neurological:  Pain and Numbness in both hands.  Skin: Skin is warm.     Assessment/Plan Right Carpal Release  Norval Slaven A, MD 11/24/2016, 8:18 AM

## 2016-11-25 LAB — TYPE AND SCREEN
ABO/RH(D): O POS
Antibody Screen: NEGATIVE

## 2016-11-28 ENCOUNTER — Other Ambulatory Visit (HOSPITAL_COMMUNITY): Payer: Self-pay | Admitting: Psychiatry

## 2016-11-29 ENCOUNTER — Encounter: Payer: Self-pay | Admitting: Family Medicine

## 2016-11-30 ENCOUNTER — Encounter: Payer: Self-pay | Admitting: Family Medicine

## 2016-12-02 ENCOUNTER — Other Ambulatory Visit: Payer: Self-pay | Admitting: Pain Medicine

## 2016-12-02 DIAGNOSIS — M5030 Other cervical disc degeneration, unspecified cervical region: Secondary | ICD-10-CM

## 2016-12-02 NOTE — Telephone Encounter (Signed)
Received fax from [facility] requesting a refill for Abilify. Per Dr. De Nurse, refill is authorize for Abilify 20mg , 30. Rx was sent to pharmacy. Pt's next apt is schedule on 12/28/16. Lvm informing pt of refill status.

## 2016-12-13 ENCOUNTER — Ambulatory Visit (INDEPENDENT_AMBULATORY_CARE_PROVIDER_SITE_OTHER): Payer: BLUE CROSS/BLUE SHIELD | Admitting: Sports Medicine

## 2016-12-13 DIAGNOSIS — M65332 Trigger finger, left middle finger: Secondary | ICD-10-CM

## 2016-12-13 DIAGNOSIS — M65322 Trigger finger, left index finger: Secondary | ICD-10-CM | POA: Diagnosis not present

## 2016-12-13 DIAGNOSIS — M65312 Trigger thumb, left thumb: Secondary | ICD-10-CM

## 2016-12-13 DIAGNOSIS — M65331 Trigger finger, right middle finger: Principal | ICD-10-CM

## 2016-12-13 DIAGNOSIS — M65342 Trigger finger, left ring finger: Secondary | ICD-10-CM | POA: Diagnosis not present

## 2016-12-13 DIAGNOSIS — M65311 Trigger thumb, right thumb: Secondary | ICD-10-CM

## 2016-12-13 DIAGNOSIS — M65321 Trigger finger, right index finger: Principal | ICD-10-CM

## 2016-12-13 DIAGNOSIS — M65351 Trigger finger, right little finger: Principal | ICD-10-CM

## 2016-12-13 DIAGNOSIS — M65352 Trigger finger, left little finger: Secondary | ICD-10-CM | POA: Diagnosis not present

## 2016-12-13 DIAGNOSIS — M65341 Trigger finger, right ring finger: Principal | ICD-10-CM

## 2016-12-13 DIAGNOSIS — M797 Fibromyalgia: Secondary | ICD-10-CM | POA: Diagnosis not present

## 2016-12-13 NOTE — Assessment & Plan Note (Signed)
Left third, fourth and first trigger finger injections. She did fantastic on the right.

## 2016-12-13 NOTE — Assessment & Plan Note (Signed)
Good improvement with physical therapy. Did not tolerate Lyrica.

## 2016-12-13 NOTE — Progress Notes (Signed)
  Subjective:    CC: Couple of issues  HPI: Fibromyalgia: Hip pain is improving with physical therapy, injections did not think. She had a negative cervical spine and lumbar spine as well as hip imaging.  Carpal tunnel syndrome: Right-sided release done about 2-3 weeks ago.  Trigger fingers: We did injections on the right in the distant past which worked very well, she is having triggering of the left first, third and fourth fingers and desires injection treatment today. I did call and discuss this with the surgeon who did her carpal tunnel release and he is agreeable to her having steroid injections.  Past medical history:  Negative.  See flowsheet/record as well for more information.  Surgical history: Negative.  See flowsheet/record as well for more information.  Family history: Negative.  See flowsheet/record as well for more information.  Social history: Negative.  See flowsheet/record as well for more information.  Allergies, and medications have been entered into the medical record, reviewed, and no changes needed.   Review of Systems: No fevers, chills, night sweats, weight loss, chest pain, or shortness of breath.   Objective:    General: Well Developed, well nourished, and in no acute distress.  Neuro: Alert and oriented x3, extra-ocular muscles intact, sensation grossly intact.  HEENT: Normocephalic, atraumatic, pupils equal round reactive to light, neck supple, no masses, no lymphadenopathy, thyroid nonpalpable.  Skin: Warm and dry, no rashes. Cardiac: Regular rate and rhythm, no murmurs rubs or gallops, no lower extremity edema.  Respiratory: Clear to auscultation bilaterally. Not using accessory muscles, speaking in full sentences.  Procedure: Real-time Ultrasound Guided Injection of left first, third, and fourth flexor tendon sheath injections Device: GE Logiq E  Verbal informed consent obtained.  Time-out conducted.  Noted no overlying erythema, induration, or other  signs of local infection.  Skin prepped in a sterile fashion.  Local anesthesia: Topical Ethyl chloride.  With sterile technique and under real time ultrasound guidance:  Using a 25-gauge needle injected a total of 1-1/2 mL kenalog 40, 1-1/2 mL lidocaine into the flexor pollicis longus tendon sheath, as well as the flexor tendon sheaths in association with the third and fourth digits. Completed without difficulty  Pain immediately resolved suggesting accurate placement of the medication.  Advised to call if fevers/chills, erythema, induration, drainage, or persistent bleeding.  Images permanently stored and available for review in the ultrasound unit.  Impression: Technically successful ultrasound guided injection.  Impression and Recommendations:    Trigger finger of all digits of both hands Left third, fourth and first trigger finger injections. She did fantastic on the right.  Fibromyalgia Good improvement with physical therapy. Did not tolerate Lyrica.

## 2016-12-20 ENCOUNTER — Ambulatory Visit (INDEPENDENT_AMBULATORY_CARE_PROVIDER_SITE_OTHER): Payer: BLUE CROSS/BLUE SHIELD

## 2016-12-20 DIAGNOSIS — M5011 Cervical disc disorder with radiculopathy,  high cervical region: Secondary | ICD-10-CM | POA: Diagnosis not present

## 2016-12-20 DIAGNOSIS — M5030 Other cervical disc degeneration, unspecified cervical region: Secondary | ICD-10-CM

## 2016-12-26 ENCOUNTER — Other Ambulatory Visit (HOSPITAL_COMMUNITY): Payer: Self-pay | Admitting: Psychiatry

## 2016-12-27 ENCOUNTER — Ambulatory Visit: Payer: Self-pay | Admitting: Sports Medicine

## 2016-12-27 NOTE — Telephone Encounter (Signed)
Received fax from [facility] requesting a refill for Remeron. Per Dr. De Nurse, refill request is denied. Refill was requested too soon. Refill was sent to pharmacy on 11/02/16 w/ 1 refill. Pt's next apt is schedule on 12/28/16. Nothing further need at this time.

## 2016-12-28 ENCOUNTER — Ambulatory Visit (HOSPITAL_COMMUNITY): Payer: Self-pay | Admitting: Psychiatry

## 2016-12-28 ENCOUNTER — Ambulatory Visit (INDEPENDENT_AMBULATORY_CARE_PROVIDER_SITE_OTHER): Payer: BLUE CROSS/BLUE SHIELD | Admitting: Physician Assistant

## 2016-12-28 VITALS — BP 93/62 | HR 80 | Ht 64.0 in | Wt 122.2 lb

## 2016-12-28 DIAGNOSIS — J42 Unspecified chronic bronchitis: Secondary | ICD-10-CM

## 2016-12-28 DIAGNOSIS — F17200 Nicotine dependence, unspecified, uncomplicated: Secondary | ICD-10-CM

## 2016-12-28 DIAGNOSIS — J441 Chronic obstructive pulmonary disease with (acute) exacerbation: Secondary | ICD-10-CM

## 2016-12-28 MED ORDER — PREDNISONE 50 MG PO TABS: ORAL_TABLET | ORAL | 0 refills | Status: DC

## 2016-12-28 NOTE — Progress Notes (Addendum)
   Subjective:    Patient ID: Mandy Shaw, female    DOB: 12-21-1960, 56 y.o.   MRN: [id number]  HPI  Pt is a 56 yo female who is a current daily smoker with hx of 36 years 1-11/2 pak a day. She stopped smoking for a few months one time but began again. She has COPD but not on daily inhaler. She has atrovent for as needed. [employer] calls her regularly and stated she needed to be seen for increased sputum production. She has sputum and coughing up stuff every day. Does seem to be worse over last few weeks. Sputum is clear. She complains of some SOB and fatigue with exertion. No fever, chills, body aches, sinus pressure, ear pain or ST.   Review of Systems  All other systems reviewed and are negative.      Objective:   Physical Exam  Constitutional: She is oriented to person, place, and time. She appears well-developed and well-nourished.  HENT:  Head: Normocephalic and atraumatic.  Right Ear: External ear normal.  Left Ear: External ear normal.  Nose: Nose normal.  Mouth/Throat: Oropharynx is clear and moist. No oropharyngeal exudate.  Eyes: Conjunctivae are normal. Right eye exhibits no discharge. Left eye exhibits no discharge.  Neck: Normal range of motion. Neck supple.  Cardiovascular: Normal rate, regular rhythm and normal heart sounds.   Pulmonary/Chest: Effort normal and breath sounds normal. She has no wheezes.  Lymphadenopathy:    She has no cervical adenopathy.  Neurological: She is alert and oriented to person, place, and time.  Psychiatric: She has a normal mood and affect. Her behavior is normal.          Assessment & Plan:  Marland KitchenMarland KitchenDiagnoses and all orders for this visit:  COPD exacerbation ([place]) -     predniSONE (DELTASONE) 50 MG tablet; Take one tablet for 5 days.  Current smoker -     Ambulatory Referral for Lung Cancer Scre  Chronic bronchitis, unspecified chronic bronchitis type ([place]) -     Ambulatory Referral for Lung Cancer Scre   Was not able to capture  pulse ox due to artifical nails.  Vitals stable.  Due to smoking hx needs CT screening referral made.  Treated with burst of prednisone for exacerbation. Did not see any signs of infection. Encouraged to add mucinex daily/as needed for sputum production.  Continue to use as needed atrovent.  Last spirometry done 2015. Pt was advised to see PCP for spirometry and to consider LAMA for daily use.  Pt is not interested is stopping smoking.    Pt brings in pulse ox from psychiatry appt at 93 percent the day after visit. Stable.

## 2016-12-29 ENCOUNTER — Other Ambulatory Visit (HOSPITAL_COMMUNITY): Payer: Self-pay | Admitting: Psychiatry

## 2016-12-29 ENCOUNTER — Encounter: Payer: Self-pay | Admitting: Physician Assistant

## 2016-12-29 ENCOUNTER — Ambulatory Visit (INDEPENDENT_AMBULATORY_CARE_PROVIDER_SITE_OTHER): Payer: Medicare Other | Admitting: Psychiatry

## 2016-12-29 ENCOUNTER — Encounter (HOSPITAL_COMMUNITY): Payer: Self-pay | Admitting: Psychiatry

## 2016-12-29 VITALS — BP 114/70 | HR 88 | Resp 16 | Ht 64.0 in | Wt 122.0 lb

## 2016-12-29 DIAGNOSIS — Z87891 Personal history of nicotine dependence: Secondary | ICD-10-CM | POA: Diagnosis not present

## 2016-12-29 DIAGNOSIS — F5102 Adjustment insomnia: Secondary | ICD-10-CM | POA: Diagnosis not present

## 2016-12-29 DIAGNOSIS — Z79899 Other long term (current) drug therapy: Secondary | ICD-10-CM

## 2016-12-29 DIAGNOSIS — F411 Generalized anxiety disorder: Secondary | ICD-10-CM

## 2016-12-29 DIAGNOSIS — F319 Bipolar disorder, unspecified: Secondary | ICD-10-CM

## 2016-12-29 DIAGNOSIS — Z811 Family history of alcohol abuse and dependence: Secondary | ICD-10-CM

## 2016-12-29 DIAGNOSIS — F17200 Nicotine dependence, unspecified, uncomplicated: Secondary | ICD-10-CM | POA: Insufficient documentation

## 2016-12-29 DIAGNOSIS — F4310 Post-traumatic stress disorder, unspecified: Secondary | ICD-10-CM | POA: Diagnosis not present

## 2016-12-29 MED ORDER — DULOXETINE HCL 60 MG PO CPEP: 120.0000 mg | ORAL_CAPSULE | Freq: Every day | ORAL | 1 refills | Status: DC

## 2016-12-29 MED ORDER — LAMOTRIGINE 200 MG PO TABS: 200.0000 mg | ORAL_TABLET | Freq: Every day | ORAL | 1 refills | Status: DC

## 2016-12-29 MED ORDER — MIRTAZAPINE 7.5 MG PO TABS: 7.5000 mg | ORAL_TABLET | Freq: Every day | ORAL | 1 refills | Status: DC

## 2016-12-29 MED ORDER — ARIPIPRAZOLE 10 MG PO TABS: 10.0000 mg | ORAL_TABLET | Freq: Every day | ORAL | 1 refills | Status: DC

## 2016-12-29 MED ORDER — TRAZODONE HCL 150 MG PO TABS: 150.0000 mg | ORAL_TABLET | Freq: Every day | ORAL | 1 refills | Status: DC

## 2016-12-29 NOTE — Progress Notes (Signed)
Patient ID: Mandy Shaw, female   DOB: 08/10/1961, 56 y.o.   MRN: [id number]  Psychiatric Outpatient Follow up visit  [name] MRN:  [id number] Date of Evaluation:  12/29/2016 Referral Source: Self/daughter  Chief Complaint:   Chief Complaint    Follow-up     Visit Diagnosis:    ICD-9-CM ICD-10-CM   1. Bipolar I disorder (HCC) 296.7 F31.9   2. GAD (generalized anxiety disorder) 300.02 F41.1   3. Adjustment insomnia 307.41 F51.02   4. PTSD (post-traumatic stress disorder) 309.81 F43.10    Diagnosis:   Patient Active Problem List   Diagnosis Date Noted  . Current smoker [F17.200] 12/29/2016  . Constipation [K59.00] 11/09/2016  . Aortic atherosclerosis ([place]) [I70.0] 09/23/2016  . Fibromyalgia [M79.7] 09/06/2016  . Carpal tunnel syndrome, bilateral [G56.03] 08/09/2016  . Bilateral hip pain [M25.551, M25.552] 08/09/2016  . Trigger finger of all digits of both hands [M65.321, M65.331, M65.341, M65.311, M65.351, M65.322, M65.312, M65.332, M65.342, M65.352] 04/15/2016  . Underweight [R63.6] 10/22/2015  . Malnutrition, calorie ([place]) [E46] 10/22/2015  . Pain in lower limb [M79.606] 09/17/2015  . Microalbuminuria [R80.9] 08/22/2015  . Former smoker [D62.229] 09/17/2014  . Insomnia [G47.00] 04/09/2014  . 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 Hypothyroidism [E03.9] 07/05/2013  . Herpes [B00.9] 09/05/2012  . Hyperlipidemia [E78.5] 06/05/2010  . Bipolar disorder ([place]) [F31.9] 08/22/2009  . 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 PTSD [F43.10] 08/22/2009  . Migraine headache [G43.909] 08/22/2009  . KNEE PAIN [M25.569] 08/22/2009  . COPD (chronic obstructive pulmonary disease) with chronic bronchitis ([place]) [J44.9] 07/23/2009  . RHEUMATOID ARTHRITIS [M06.9] 07/23/2009   History of Present Illness:  56 years old currently married Caucasian female initially  referred by herself and her daughter she  follows with Dr. Ward Chatters upstairs as primary care. Has  been diagnosed bipolar and died a condition, PTSD disability since 32.    Patient is doing somewhat better regarding mood wise continues taking Abilify but apparently Abilify dose of 20 mg was making her too sleepy last visit increased because of that was some concern about the increase of elevated mood but she is doing better on the 10 mg not excessively sleeping either. Flashbacks are infrequent  Insomnia is improving she is on trazodone and Remeron next  Aggravating factors as her medical condition and her pain. Husband is blind since 2012. Modifying factors is loving husband  Severe depression is improving she is now 7 out of 56. 10 being no depression  Duration: more then 20 years    Past Medical History:  Past Medical History:  Diagnosis Date  . Anxiety   . Arthritis   . Bipolar 1 disorder ([place])   . Chronic kidney disease    stage 3 kidney disease  . COPD (chronic obstructive pulmonary disease) ([place])   . Dyspnea   . Foot drop, right foot   . GERD (gastroesophageal reflux disease)   . Hypothyroidism   . Insomnia   . Myasthenia gravis ([place])   . 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 Personality disorder   . Thyroid disease     Past Surgical History:  Procedure Laterality Date  . CARDIAC SURGERY     flap on heart removed  . CARPAL TUNNEL RELEASE Right 11/24/2016   Procedure: RIGHT CARPAL TUNNEL RELEASE;  Surgeon: Latanya Maudlin, MD;  Location: [facility];  Service: Orthopedics;  Laterality: Right;  . CESAREAN  SECTION    . CHOLECYSTECTOMY    . CONDYLOMA EXCISION/FULGURATION    . EYE SURGERY     bil cataracts  . INCONTINENCE SURGERY    . INDUCED ABORTION    . LAPAROTOMY     exploratory for pain  . LEEP    . Left hip surgery  01/09/14   Dr. Arnette Norris  . right hip surgery  09/26/2013   Dr. Arnette Norris  . SHOULDER SURGERY Right 2015   Dr. Margretta Sidle  . THYROIDECTOMY    . vaginal mesh      x 2   Family History:  Family History  Problem Relation Age of Onset  . Alcohol abuse Father    Social History:   Social History   Social History  . Marital status: Married    Spouse name: N/A  . Number of children: 1  . Years of education: N/A   Occupational History  . disabled    Social History Main Topics  . Smoking status: Former Smoker    Packs/day: 1.50    Years: 40.00    Types: Cigarettes    Quit date: 10/22/2015  . Smokeless tobacco: Never Used  . Alcohol use 0.0 oz/week     Comment: 1-2 drinks yearly   . Drug use: No  . Sexual activity: Yes    Partners: Male     Comment: married, 1 daughter, 1  1/2 yrs college, self-employed, no reg exercise, 3 caffeine drinks daily.   Other Topics Concern  . None   Social History Narrative   Husband is legally blind. Likes spending weekends at [employer] in [place]. Working our regularly.       Psychiatric Specialty Exam: HPI  Review of Systems  Constitutional: Negative for fever.  Cardiovascular: Negative for chest pain.  Musculoskeletal: Positive for myalgias.  Skin: Negative for rash.  Neurological: Negative for tremors.  Psychiatric/Behavioral: Negative for depression and suicidal ideas.    Blood pressure 114/70, pulse 88, resp. rate 16, height 5\' 4"  (1.626 m), weight 122 lb (55.3 kg), last menstrual period 06/20/2014, SpO2 93 %.Body mass index is 20.94 kg/m.  General Appearance: Casual  Eye Contact:  Fair  Speech:  Slow  Volume: normal  Mood: good  Affect:  reactive  Thought Process:  Coherent  Orientation:  Full (Time, Place, and Person)  Thought Content:  Rumination  Suicidal Thoughts:  No  Homicidal Thoughts:  No  Memory:  Immediate;   Fair Recent;   Fair  Judgement:  Fair  Insight:  Shallow  Psychomotor Activity:  Decreased  Concentration:  Fair  Recall:  [employer] of Knowledge:Fair  Language: Fair  Akathisia:  Negative  Handed:  Right  AIMS (if indicated):    Assets:  Desire for  Improvement Social Support  ADL's:  Intact  Cognition: WNL  Sleep:  Poor to variable     Allergies:   Allergies  Allergen Reactions  . Albuterol Anaphylaxis  . Other Anaphylaxis    MANGO, PARSLEY.  [name] Seed Anaphylaxis  . Penicillins Anaphylaxis    Has patient had a PCN reaction causing immediate rash, facial/tongue/throat swelling, SOB or lightheadedness with hypotension: Yes Has patient had a PCN reaction causing severe rash involving mucus membranes or skin necrosis: Yes Has patient had a PCN reaction that required hospitalization No Has patient had a PCN reaction occurring within the last 10 years: No If all of the above answers are "NO", then may proceed with Cephalosporin use.   Roxy Manns [  Nutritional Supplements] Itching  . Sulfonamide Derivatives Anaphylaxis  . Linzess [Linaclotide] Other (See Comments)    dizziness  . Macrodantin [Nitrofurantoin Macrocrystal] Itching  . Neurontin [Gabapentin] Other (See Comments)    Unable to walk or talk   . Pregabalin Other (See Comments)    dizziness   Current Medications: Current Outpatient Prescriptions  Medication Sig Dispense Refill  . ARIPiprazole (ABILIFY) 10 MG tablet Take 1 tablet (10 mg total) by mouth daily. 30 tablet 1  . azaTHIOprine (IMURAN) 50 MG tablet Take 50-100 mg by mouth See admin instructions. Takes 50 mg in the morning and 100 mg at night    . Calcium Carb-Cholecalciferol (CALCIUM 600 + D PO) Take 1 tablet by mouth 2 (two) times daily.     . capsicum (ZOSTRIX) 0.075 % topical cream Apply 1 application topically 2 (two) times daily. 28.3 g 0  . docusate sodium (COLACE) 100 MG capsule Take 300 mg by mouth at bedtime.    . DULoxetine (CYMBALTA) 60 MG capsule Take 2 capsules (120 mg total) by mouth daily. 60 capsule 1  . famotidine (PEPCID) 20 MG tablet take 1 tablet by mouth daily 30 tablet 6  . hydrochlorothiazide (HYDRODIURIL) 25 MG tablet Take 25 mg by mouth 2 (two) times daily.  1  . ipratropium  (ATROVENT HFA) 17 MCG/ACT inhaler Inhale 2 puffs into the lungs every 6 (six) hours as needed for wheezing. 1 Inhaler 2  . lamoTRIgine (LAMICTAL) 200 MG tablet Take 1 tablet (200 mg total) by mouth daily. One per day. 30 tablet 1  . levothyroxine (SYNTHROID, LEVOTHROID) 125 MCG tablet take 1 tablet by mouth once daily 30 tablet 3  . mirtazapine (REMERON) 7.5 MG tablet Take 1 tablet (7.5 mg total) by mouth at bedtime. One at night 30 tablet 1  . Multiple Vitamins-Minerals (CENTRUM SILVER ADULT 50+ PO) Take 1 capsule by mouth daily.    . potassium chloride (K-DUR) 10 MEQ tablet take 1 tablet by mouth once daily    . predniSONE (DELTASONE) 50 MG tablet Take one tablet for 5 days. 5 tablet 0  . primidone (MYSOLINE) 50 MG tablet Take 50 mg by mouth 2 (two) times daily.   0  . rizatriptan (MAXALT) 10 MG tablet take 1 tablet by mouth AT ONSET OF HEADACHE. may repeat after 2 h...  (REFER TO PRESCRIPTION NOTES).  0  . senna (SENOKOT) 8.6 MG TABS tablet Take 2 tablets by mouth at bedtime.    . simvastatin (ZOCOR) 20 MG tablet take 1 tablet by mouth ONCE DAILY; PLEASE SEE PROVIDER FOR LIPID PANEL 90 tablet 3  . tiZANidine (ZANAFLEX) 4 MG tablet Take 4 mg by mouth every 6 (six) hours as needed for muscle spasms.    . Topiramate ER (TROKENDI XR) 200 MG CP24 Take 200 mg by mouth daily.    . traZODone (DESYREL) 150 MG tablet Take 1 tablet (150 mg total) by mouth at bedtime. Please discontinue all previous prescriptions. 30 tablet 1   No current facility-administered medications for this visit.       Treatment Plan Summary: Medication management and Plan as follows  1. Bipolar: baseline. Continue lamictal.   2. PTSD: not worse. Continue cymbalta.  3. Insomnia: continue remeron and reviewed sleep hygiene Mood does get effected by her arthritic pain or myalgia. Working with providers with injections and management  FU 2-3 months. Prescriptions sent. Provided supportive therapy Lissie Hinesley,  Damaree Sargent 4/11/20182:04 PM

## 2017-01-03 ENCOUNTER — Other Ambulatory Visit: Payer: Self-pay | Admitting: Acute Care

## 2017-01-03 ENCOUNTER — Ambulatory Visit: Payer: Medicare Other | Admitting: Podiatry

## 2017-01-03 DIAGNOSIS — Z87891 Personal history of nicotine dependence: Secondary | ICD-10-CM

## 2017-01-06 ENCOUNTER — Ambulatory Visit (INDEPENDENT_AMBULATORY_CARE_PROVIDER_SITE_OTHER): Payer: BLUE CROSS/BLUE SHIELD | Admitting: Sports Medicine

## 2017-01-06 ENCOUNTER — Encounter: Payer: Self-pay | Admitting: Sports Medicine

## 2017-01-06 DIAGNOSIS — M65352 Trigger finger, left little finger: Secondary | ICD-10-CM | POA: Diagnosis not present

## 2017-01-06 DIAGNOSIS — G7000 Myasthenia gravis without (acute) exacerbation: Secondary | ICD-10-CM | POA: Insufficient documentation

## 2017-01-06 DIAGNOSIS — M65331 Trigger finger, right middle finger: Secondary | ICD-10-CM | POA: Diagnosis not present

## 2017-01-06 DIAGNOSIS — M65351 Trigger finger, right little finger: Secondary | ICD-10-CM | POA: Diagnosis not present

## 2017-01-06 DIAGNOSIS — M5416 Radiculopathy, lumbar region: Secondary | ICD-10-CM

## 2017-01-06 DIAGNOSIS — M65312 Trigger thumb, left thumb: Secondary | ICD-10-CM

## 2017-01-06 DIAGNOSIS — M65321 Trigger finger, right index finger: Secondary | ICD-10-CM | POA: Diagnosis not present

## 2017-01-06 DIAGNOSIS — M65341 Trigger finger, right ring finger: Secondary | ICD-10-CM

## 2017-01-06 DIAGNOSIS — M65322 Trigger finger, left index finger: Secondary | ICD-10-CM

## 2017-01-06 DIAGNOSIS — M65332 Trigger finger, left middle finger: Secondary | ICD-10-CM

## 2017-01-06 DIAGNOSIS — M65311 Trigger thumb, right thumb: Secondary | ICD-10-CM | POA: Diagnosis not present

## 2017-01-06 DIAGNOSIS — M65342 Trigger finger, left ring finger: Secondary | ICD-10-CM

## 2017-01-06 MED ORDER — TIZANIDINE HCL 6 MG PO CAPS: 6.0000 mg | ORAL_CAPSULE | Freq: Three times a day (TID) | ORAL | 0 refills | Status: DC

## 2017-01-06 NOTE — Assessment & Plan Note (Signed)
At this point she is having recurrence of trigger finger in her right third and fourth and her left first, third, and fourth fingers. We have injected all of these under ultrasound guidance with varying degrees of relief. At this point I think I have much to offer her from a nonsurgical perspective, referral to Dr. Amedeo Plenty for consideration of surgical intervention.

## 2017-01-06 NOTE — Progress Notes (Signed)
Subjective:    I'm seeing this patient as a consultation for:  Dr. Beatrice Lecher  CC: Right leg pain  HPI: For sometime now this 56 year old female has had weakness to dorsiflexion of her right foot with occasional tripping, as well as numbness on the anterior of the shin. She was seen by her neurologist where a nerve conduction and EMG showed by her report a potentially radicular source. No bowel or bladder dysfunction, saddle numbness, constitutional symptoms.  Trigger fingers: Multiple on the right and the left, we most recently injected her left first, third, and fourth flexor tendon sheaths under ultrasound guidance, in the past she had a good response to a right's third and fourth flexor tendon sheath injections. Unfortunately all of her triggering and pain has returned.  Past medical history:  Negative.  See flowsheet/record as well for more information.  Surgical history: Negative.  See flowsheet/record as well for more information.  Family history: Negative.  See flowsheet/record as well for more information.  Social history: Negative.  See flowsheet/record as well for more information.  Allergies, and medications have been entered into the medical record, reviewed, and no changes needed.   Review of Systems: No headache, visual changes, nausea, vomiting, diarrhea, constipation, dizziness, abdominal pain, skin rash, fevers, chills, night sweats, weight loss, swollen lymph nodes, body aches, joint swelling, muscle aches, chest pain, shortness of breath, mood changes, visual or auditory hallucinations.   Objective:   General: Well Developed, well nourished, and in no acute distress.  Neuro/Psych: Alert and oriented x3, extra-ocular muscles intact, able to move all 4 extremities, sensation grossly intact. Skin: Warm and dry, no rashes noted.  Respiratory: Not using accessory muscles, speaking in full sentences, trachea midline.  Cardiovascular: Pulses palpable, no extremity  edema. Abdomen: Does not appear distended. Back Exam:  Inspection: Unremarkable  Motion: Flexion 45 deg, Extension 45 deg, Side Bending to 45 deg bilaterally,  Rotation to 45 deg bilaterally  SLR laying: Negative  XSLR laying: Negative  Palpable tenderness: None. FABER: negative. Sensory change: Gross sensation intact to all lumbar and sacral dermatomes.  Reflexes: 2+ at both patellar tendons, 2+ at achilles tendons, Babinski's downgoing.  Strength at foot  Plantar-flexion: 5/5 Dorsi-flexion: 5/5 Eversion: 5/5 Inversion: 5/5  Leg strength  Quad: 5/5 Hamstring: 5/5 Hip flexor: 5/5 Hip abductors: 5/5  Gait unremarkable.  Impression and Recommendations:   This case required medical decision making of moderate complexity.  Trigger finger of all digits of both hands At this point she is having recurrence of trigger finger in her right third and fourth and her left first, third, and fourth fingers. We have injected all of these under ultrasound guidance with varying degrees of relief. At this point I think I have much to offer her from a nonsurgical perspective, referral to Dr. Amedeo Plenty for consideration of surgical intervention.  Right lumbar radiculopathy Increasing tripping and weakness to foot dorsiflexion on the right. Apparently a nerve conduction/EMG showed a radicular cause of her foot drop. I'm really not noticing much foot drop on exam, however considering her EMG and nerve conduction we are going to proceed with a lumbar spine MRI.   Myasthenia gravis ([place]) She did express some concerns with her neurologist taking too long to see her, with the waits over one hour past her appointment time. She requested a referral to another neurologist, I did recommend that she seek another provider within the same practice for continuity. I told her to go and just ask  the front office who typically runs on time in that office.

## 2017-01-06 NOTE — Assessment & Plan Note (Signed)
She did express some concerns with her neurologist taking too long to see her, with the waits over one hour past her appointment time. She requested a referral to another neurologist, I did recommend that she seek another provider within the same practice for continuity. I told her to go and just ask the front office who typically runs on time in that office.

## 2017-01-06 NOTE — Assessment & Plan Note (Signed)
Increasing tripping and weakness to foot dorsiflexion on the right. Apparently a nerve conduction/EMG showed a radicular cause of her foot drop. I'm really not noticing much foot drop on exam, however considering her EMG and nerve conduction we are going to proceed with a lumbar spine MRI.

## 2017-01-07 ENCOUNTER — Encounter: Payer: Self-pay | Admitting: Acute Care

## 2017-01-07 ENCOUNTER — Ambulatory Visit (INDEPENDENT_AMBULATORY_CARE_PROVIDER_SITE_OTHER): Payer: BLUE CROSS/BLUE SHIELD | Admitting: Acute Care

## 2017-01-07 ENCOUNTER — Ambulatory Visit (INDEPENDENT_AMBULATORY_CARE_PROVIDER_SITE_OTHER)
Admission: RE | Admit: 2017-01-07 | Discharge: 2017-01-07 | Disposition: A | Discharge status: Home / Self Care | Payer: BLUE CROSS/BLUE SHIELD | Source: Ambulatory Visit | Attending: Acute Care | Admitting: Acute Care

## 2017-01-07 DIAGNOSIS — Z87891 Personal history of nicotine dependence: Secondary | ICD-10-CM | POA: Diagnosis not present

## 2017-01-07 NOTE — Progress Notes (Signed)
Shared Decision Making Visit Lung Cancer Screening Program [phone])   Eligibility:  Age 56 y.o.  Pack Years Smoking History Calculation: 51 pack years smoker  (# packs/per year x # years smoked)  Recent History of coughing up blood  no  Unexplained weight loss? no ( >Than 15 pounds within the last 6 months )  Prior History Lung / other cancer no (Diagnosis within the last 5 years already requiring surveillance chest CT Scans).  Smoking Status Former Smoker  Former Smokers: Years since quit: 1 year  Quit Date: 11/12/2015  Visit Components:  Discussion included one or more decision making aids. yes  Discussion included risk/benefits of screening. yes  Discussion included potential follow up diagnostic testing for abnormal scans. yes  Discussion included meaning and risk of over diagnosis. yes  Discussion included meaning and risk of False Positives. yes  Discussion included meaning of total radiation exposure. yes  Counseling Included:  Importance of adherence to annual lung cancer LDCT screening. yes  Impact of comorbidities on ability to participate in the program. yes  Ability and willingness to under diagnostic treatment. yes  Smoking Cessation Counseling:  Current Smokers:   Discussed importance of smoking cessation. NA  Information about tobacco cessation classes and interventions provided to patient. yes  Patient provided with "ticket" for LDCT Scan. yes  Symptomatic Patient. no  Counseling  Diagnosis Code: Tobacco Use Z72.0  Asymptomatic Patient yes  Counseling (Intermediate counseling: > three minutes counseling) Q6578  Former Smokers:   Discussed the importance of maintaining cigarette abstinence. yes  Diagnosis Code: Personal History of Nicotine Dependence. I69.629  Information about tobacco cessation classes and interventions provided to patient. Yes  Patient provided with "ticket" for LDCT Scan. yes  Written Order for Lung Cancer  Screening with LDCT placed in Epic. Yes (CT Chest Lung Cancer Screening Low Dose W/O CM) [id number] Z12.2-Screening of respiratory organs Z87.891-Personal history of nicotine dependence  I spent 25 minutes of face to face time with Mandy Shaw discussing the risks and benefits of lung cancer screening. We viewed a power point together that explained in detail the above noted topics. We took the time to pause the power point at intervals to allow for questions to be asked and answered to ensure understanding. We discussed that she  had taken the single most powerful action possible to decrease her risk of developing lung cancer when she quit smoking. I counseled her to remain smoke free, and to contact me if she ever had the desire to smoke again so that I can provide resources and tools to help support the effort to remain smoke free. We discussed the time and location of the scan, and that either Mandy Glassman RN or I will call with the results within  24-48 hours of receiving them. She  Was offered  my card and contact information in the event she needs to speak with me, in addition to a copy of the power point we reviewed as a resource. She verbalized understanding of all of the above and had no further questions upon leaving the office.   We discussed that there is a high incidence of coronary artery disease noted on these scans. I explained that this is a non-gated exam and therefore degree are severity could not be determined. The patient told me she is currently on statin therapy and has a known diagnosis of coronary artery disease. I explained that we will fax a copy of these results to her primary care physician for follow-up  as they feel is clinically indicated. The patient verbalized understanding.    Mandy Spatz, NP 01/07/2017

## 2017-01-10 ENCOUNTER — Ambulatory Visit (INDEPENDENT_AMBULATORY_CARE_PROVIDER_SITE_OTHER): Payer: BLUE CROSS/BLUE SHIELD

## 2017-01-10 ENCOUNTER — Encounter: Payer: Self-pay | Admitting: Podiatry

## 2017-01-10 ENCOUNTER — Ambulatory Visit (INDEPENDENT_AMBULATORY_CARE_PROVIDER_SITE_OTHER): Payer: Medicare Other | Admitting: Podiatry

## 2017-01-10 ENCOUNTER — Ambulatory Visit: Payer: BLUE CROSS/BLUE SHIELD | Admitting: Sports Medicine

## 2017-01-10 DIAGNOSIS — L600 Ingrowing nail: Secondary | ICD-10-CM | POA: Diagnosis not present

## 2017-01-10 DIAGNOSIS — M5416 Radiculopathy, lumbar region: Secondary | ICD-10-CM

## 2017-01-10 DIAGNOSIS — M79671 Pain in right foot: Secondary | ICD-10-CM

## 2017-01-10 DIAGNOSIS — M1288 Other specific arthropathies, not elsewhere classified, other specified site: Secondary | ICD-10-CM

## 2017-01-10 DIAGNOSIS — M79672 Pain in left foot: Secondary | ICD-10-CM | POA: Diagnosis not present

## 2017-01-10 NOTE — Progress Notes (Signed)
SUBJECTIVE: 56 y.o. year old female presents requesting painful ingrown nails trimmed. Stated that she has had carpal tunnel surgery on right 11/24/16 and recovering well.Still wearing wrist brace.  HPI: Need be seen q 6-8 weeks for painful calluses and ingrown nails.  Co existing condition: Myasthenia Gravis, Fibromyalgia.  OBJECTIVE: DERMATOLOGIC EXAMINATION: Deformed painfulhallucal nails bilateral. No calluses noted today. VASCULAR EXAMINATION OF LOWER LIMBS: All pedal pulses are faintly palpable. Capillary Filling times within 3 seconds in all digits.  Temperature gradient from tibial crest to dorsum of foot is within normal bilateral. NEUROLOGIC EXAMINATION OF THE LOWER LIMBS: Subjective burning pain at dorsum of both feet, gets worse after prolonged ambulation.  Pain at rest in bed or sitting in chair. Some times pain is when the legs are crossed.  Has had failed to respond to Monofilament sensory testing with normal vibratory sensory.  MUSCULOSKELETAL EXAMINATION: High arched cavus type foot bilateral. Plantar flexed 5th metatarsal right.  ASSESSMENT: 1. 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  2. Symptomatic ingrown nails both great toes due to residual nail growth following nail ingrown nail surgery. 3. Peripheral neuropathy. 4. Pain in lower limb.   PLAN: Ingrown hallucal nails and all nails debrided. Pain was relieved.

## 2017-01-10 NOTE — Patient Instructions (Signed)
Seen for hypertrophic nails. All nails debrided. Return in 6 weeks or sooner if needed.

## 2017-01-17 ENCOUNTER — Other Ambulatory Visit: Payer: Self-pay | Admitting: Acute Care

## 2017-01-17 DIAGNOSIS — Z87891 Personal history of nicotine dependence: Secondary | ICD-10-CM

## 2017-01-24 ENCOUNTER — Other Ambulatory Visit: Payer: Self-pay | Admitting: *Deleted

## 2017-01-24 ENCOUNTER — Ambulatory Visit: Payer: Self-pay | Admitting: Family Medicine

## 2017-01-24 MED ORDER — SIMVASTATIN 20 MG PO TABS: ORAL_TABLET | ORAL | 3 refills | Status: DC

## 2017-01-24 MED ORDER — FAMOTIDINE 20 MG PO TABS: 20.0000 mg | ORAL_TABLET | Freq: Every day | ORAL | 2 refills | Status: DC

## 2017-01-24 MED ORDER — LEVOTHYROXINE SODIUM 125 MCG PO TABS: 125.0000 ug | ORAL_TABLET | Freq: Every day | ORAL | 0 refills | Status: DC

## 2017-01-25 ENCOUNTER — Encounter: Payer: Self-pay | Admitting: Family Medicine

## 2017-01-25 ENCOUNTER — Ambulatory Visit (INDEPENDENT_AMBULATORY_CARE_PROVIDER_SITE_OTHER): Payer: BLUE CROSS/BLUE SHIELD | Admitting: Family Medicine

## 2017-01-25 VITALS — BP 102/62 | HR 85 | Ht 64.0 in | Wt 130.0 lb

## 2017-01-25 DIAGNOSIS — J449 Chronic obstructive pulmonary disease, unspecified: Secondary | ICD-10-CM

## 2017-01-25 DIAGNOSIS — B078 Other viral warts: Secondary | ICD-10-CM | POA: Diagnosis not present

## 2017-01-25 DIAGNOSIS — D2262 Melanocytic nevi of left upper limb, including shoulder: Secondary | ICD-10-CM

## 2017-01-25 DIAGNOSIS — R232 Flushing: Secondary | ICD-10-CM | POA: Diagnosis not present

## 2017-01-25 DIAGNOSIS — E039 Hypothyroidism, unspecified: Secondary | ICD-10-CM | POA: Diagnosis not present

## 2017-01-25 DIAGNOSIS — R05 Cough: Secondary | ICD-10-CM | POA: Diagnosis not present

## 2017-01-25 DIAGNOSIS — E65 Localized adiposity: Secondary | ICD-10-CM | POA: Diagnosis not present

## 2017-01-25 DIAGNOSIS — R109 Unspecified abdominal pain: Secondary | ICD-10-CM | POA: Diagnosis not present

## 2017-01-25 DIAGNOSIS — Z87891 Personal history of nicotine dependence: Secondary | ICD-10-CM | POA: Diagnosis not present

## 2017-01-25 DIAGNOSIS — R058 Other specified cough: Secondary | ICD-10-CM

## 2017-01-25 DIAGNOSIS — G43909 Migraine, unspecified, not intractable, without status migrainosus: Secondary | ICD-10-CM | POA: Diagnosis not present

## 2017-01-25 DIAGNOSIS — R059 Cough, unspecified: Secondary | ICD-10-CM

## 2017-01-25 MED ORDER — PANTOPRAZOLE SODIUM 40 MG PO TBEC: 40.0000 mg | DELAYED_RELEASE_TABLET | Freq: Every day | ORAL | 3 refills | Status: DC

## 2017-01-25 MED ORDER — IPRATROPIUM BROMIDE 0.02 % IN SOLN
0.5000 mg | Freq: Once | RESPIRATORY_TRACT | Status: AC
  Administered 2017-01-25: 0.5 mg via RESPIRATORY_TRACT

## 2017-01-25 NOTE — Progress Notes (Signed)
Subjective:    CC: COPD, MIgraine   HPI:  Follow-up COPD- Here today to follow-up for COPD. She more recently was treated with a COPD exacerbation about a month ago. [place] [facility] [place] so she was encouraged come in in a month. She herself is concerned because for several months she's been noticing a significant increase in sputum production. She hasn't noticed a significant change in cough or shortness of breath. She did quit smoking about a year ago. She says the sputum is frequent requires her to clear her throat and has a gray discoloration. She has noticed some occasional reflux recently.  She also has intermittent right-sided pain. She's had this on and off for probably a couple years but it seems to have come back more frequently. She says when it occurs it comes in waves it's over the right hip area. She says what happens it will last all day. It's now happening about 3 days per week. She does have a history of chronic constipation and has had previous workup for abdominal pain in the past.  She also has a lesion on her right thumb that she would like me to look at. She says normally she just takes clippers and clips it off every couple of months. Is been there for almost a year at this point. She says it'll bleed afterwards and then usually heals over. It otherwise doesn't bother her as far as pain or itching.  She is getting a warm flush sensation. She says it almost feels like when you get contrast dye injected for an imaging procedure. She says it's not a hot flash this actually feels different. She says the whole episode with typically last about 5 minutes and goes literally from the top of her head down to her knees. She has not noticed any other relationship to when this occurs.  She also has a brown spot on her left thumb that she would like me to look at today.  He also wants discussed the extra tissue and fat between her knees. She says is getting quite bothersome. It's  actually rubbing together and causing some irritation and makes it more difficult to work out.  BP 102/62   Pulse 85   Ht 5\' 4"  (1.626 m)   Wt 130 lb (59 kg)   LMP 06/20/2014   SpO2 99%   BMI 22.31 kg/m     Allergies  Allergen Reactions  . Albuterol Anaphylaxis  . Other Anaphylaxis    MANGO, PARSLEY.  [name] Seed Anaphylaxis  . Penicillins Anaphylaxis    Has patient had a PCN reaction causing immediate rash, facial/tongue/throat swelling, SOB or lightheadedness with hypotension: Yes Has patient had a PCN reaction causing severe rash involving mucus membranes or skin necrosis: Yes Has patient had a PCN reaction that required hospitalization No Has patient had a PCN reaction occurring within the last 10 years: No If all of the above answers are "NO", then may proceed with Cephalosporin use.   . Silica [Nutritional Supplements] Itching  . Sulfonamide Derivatives Anaphylaxis  . Linzess [Linaclotide] Other (See Comments)    dizziness  . Macrodantin [Nitrofurantoin Macrocrystal] Itching  . Neurontin [Gabapentin] Other (See Comments)    Unable to walk or talk   . Pregabalin Other (See Comments)    dizziness    Past Medical History:  Diagnosis Date  . Anxiety   . Arthritis   . Bipolar 1 disorder ([place])   . Chronic kidney disease  stage 3 kidney disease  . COPD (chronic obstructive pulmonary disease) ([place])   . Dyspnea   . Foot drop, right foot   . GERD (gastroesophageal reflux disease)   . Hypothyroidism   . Insomnia   . Myasthenia gravis ([place])   . 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 Personality disorder   . Thyroid disease     Past Surgical History:  Procedure Laterality Date  . CARDIAC SURGERY     flap on heart removed  . CARPAL TUNNEL RELEASE Right 11/24/2016   Procedure: RIGHT CARPAL TUNNEL RELEASE;  Surgeon: Latanya Maudlin, MD;  Location: [facility];  Service: Orthopedics;  Laterality: Right;  . CESAREAN SECTION    . CHOLECYSTECTOMY     . CONDYLOMA EXCISION/FULGURATION    . EYE SURGERY     bil cataracts  . INCONTINENCE SURGERY    . INDUCED ABORTION    . LAPAROTOMY     exploratory for pain  . LEEP    . Left hip surgery  01/09/14   Dr. Arnette Norris  . right hip surgery  09/26/2013   Dr. Arnette Norris  . SHOULDER SURGERY Right 2015   Dr. Margretta Sidle  . THYROIDECTOMY    . vaginal mesh     x 2    Social History   Social History  . Marital status: Married    Spouse name: N/A  . Number of children: 1  . Years of education: N/A   Occupational History  . disabled    Social History Main Topics  . Smoking status: Former Smoker    Packs/day: 1.50    Years: 40.00    Types: Cigarettes    Quit date: 10/22/2015  . Smokeless tobacco: Never Used  . Alcohol use 0.0 oz/week     Comment: 1-2 drinks yearly   . Drug use: No  . Sexual activity: Yes    Partners: Male     Comment: married, 1 daughter, 1  1/2 yrs college, self-employed, no reg exercise, 3 caffeine drinks daily.   Other Topics Concern  . Not on file   Social History Narrative   Husband is legally blind. Likes spending weekends at [employer] in [place]. Working our regularly.     Family History  Problem Relation Age of Onset  . Alcohol abuse Father     Outpatient Encounter Prescriptions as of 01/25/2017  Medication Sig  . ARIPiprazole (ABILIFY) 10 MG tablet Take 1 tablet (10 mg total) by mouth daily.  Marland Kitchen azaTHIOprine (IMURAN) 50 MG tablet Take 50-100 mg by mouth See admin instructions. Takes 50 mg in the morning and 100 mg at night  . Calcium Carb-Cholecalciferol (CALCIUM 600 + D PO) Take 1 tablet by mouth 2 (two) times daily.   . capsicum (ZOSTRIX) 0.075 % topical cream Apply 1 application topically 2 (two) times daily.  Marland Kitchen docusate sodium (COLACE) 100 MG capsule Take 300 mg by mouth at bedtime.  . DULoxetine (CYMBALTA) 60 MG capsule Take 2 capsules (120 mg total) by mouth daily.  . famotidine (PEPCID) 20 MG tablet Take 1 tablet (20 mg total) by  mouth daily.  . hydrochlorothiazide (HYDRODIURIL) 25 MG tablet Take 25 mg by mouth 2 (two) times daily.  Marland Kitchen ipratropium (ATROVENT HFA) 17 MCG/ACT inhaler Inhale 2 puffs into the lungs every 6 (six) hours as needed for wheezing.  . lamoTRIgine (LAMICTAL) 200 MG tablet Take 1 tablet (200 mg total) by mouth daily. One per day.  . levothyroxine (SYNTHROID,  LEVOTHROID) 125 MCG tablet Take 1 tablet (125 mcg total) by mouth daily.  . mirtazapine (REMERON) 7.5 MG tablet Take 1 tablet (7.5 mg total) by mouth at bedtime. One at night  . Multiple Vitamins-Minerals (CENTRUM SILVER ADULT 50+ PO) Take 1 capsule by mouth daily.  . potassium chloride (K-DUR) 10 MEQ tablet take 1 tablet by mouth once daily  . primidone (MYSOLINE) 50 MG tablet Take 50 mg by mouth 2 (two) times daily.   . rizatriptan (MAXALT) 10 MG tablet take 1 tablet by mouth AT ONSET OF HEADACHE. may repeat after 2 h...  (REFER TO PRESCRIPTION NOTES).  Marland Kitchen senna (SENOKOT) 8.6 MG TABS tablet Take 2 tablets by mouth at bedtime.  . simvastatin (ZOCOR) 20 MG tablet take 1 tablet by mouth at bedtime  . tizanidine (ZANAFLEX) 6 MG capsule Take 1 capsule (6 mg total) by mouth 3 (three) times daily.  . Topiramate ER (TROKENDI XR) 200 MG CP24 Take 200 mg by mouth daily.  . traZODone (DESYREL) 150 MG tablet Take 1 tablet (150 mg total) by mouth at bedtime. Please discontinue all previous prescriptions.  . pantoprazole (PROTONIX) 40 MG tablet Take 1 tablet (40 mg total) by mouth daily.  . [EXPIRED] ipratropium (ATROVENT) nebulizer solution 0.5 mg    No facility-administered encounter medications on file as of 01/25/2017.        Review of Systems: No fevers, chills, night sweats, weight loss, chest pain, or shortness of breath.   Objective:    General: Well Developed, well nourished, and in no acute distress.  Neuro: Alert and oriented x3, extra-ocular muscles intact, sensation grossly intact.  HEENT: Normocephalic, atraumatic  Skin: Warm and dry, no  rashes.Her left thumb she has a small brown nevus. On her right thumb she has what is a raised approximately 3 mm flesh-colored papule that looks dry on the surface most consistent with a wart. Cardiac: Regular rate and rhythm, no murmurs rubs or gallops, no lower extremity edema.  Respiratory: Clear to auscultation bilaterally. Not using accessory muscles, speaking in full sentences. MSK: Has had some bulging fatty tissues on both medial knees.   Impression and Recommendations:   COPD-actually her spirometry today really looks fantastic. FVC of the 107%, FEV1 of 107% and ratio of 78% which actually is technically normal spirometry. She did have some improvement since this was done in 2015 which I think is secondary to her quitting smoking. Next  Excess sputum production-explained that I'm not convinced that this is necessarily coming from her lungs. She's now had normal spirometry and recently had a CAT scan to rule out lung cancer etc. At this point I think it could actually be reflux related to recommend a trial of a PPI. Currently she just takes famotidine once a day. I think she should try this for 2-3 weeks. If her symptoms improve and she can continue treatment for 2-3 months. If it does not then we can consider further workup by seeing pulmonologist.  Right flank pain-I think in the past we have felt like this could actually be coming from constipation. I did order a KUB. She wants to wait and go when the pain occurs again. Encouraged her to work on making sure that she's really emptying her bowels.  Lesion on right thumb-most consistent with a wart. Recommend a trial of the over-the-counter salicylic acid work remover. If not improving with denies consider cryotherapy.  Nevus on the left thumb. Gave reassurance.  She does have extra fatty tissue on the  insides of both knees. Explained that this would have to be removed cosmetically. Certainly she is having some pain behind the knee as well  that could certainly be investigated by an orthopedist but I do not think that the excess fat on the inside of the knees are necessarily the cause of her knee pain.  Flushing sensation-unclear etiology at this point. Doesn't quite sound like a hot flash. If it was a little bit on the low side back in January. It may be worth rechecking this. Unlikely to be pheochromocytoma. Lab Results  Component Value Date   TSH 0.49 09/21/2016

## 2017-01-27 ENCOUNTER — Other Ambulatory Visit: Payer: Self-pay | Admitting: Family Medicine

## 2017-01-27 LAB — TSH: TSH: 0.42 u[IU]/mL (ref 0.41–5.90)

## 2017-01-28 ENCOUNTER — Ambulatory Visit (INDEPENDENT_AMBULATORY_CARE_PROVIDER_SITE_OTHER): Payer: BLUE CROSS/BLUE SHIELD

## 2017-01-28 DIAGNOSIS — K5900 Constipation, unspecified: Secondary | ICD-10-CM

## 2017-01-28 DIAGNOSIS — R109 Unspecified abdominal pain: Secondary | ICD-10-CM | POA: Diagnosis not present

## 2017-01-28 LAB — TSH: TSH: 0.422 u[IU]/mL — ABNORMAL LOW (ref 0.450–4.500)

## 2017-01-31 ENCOUNTER — Telehealth (HOSPITAL_COMMUNITY): Payer: Self-pay | Admitting: Psychiatry

## 2017-01-31 DIAGNOSIS — K5909 Other constipation: Secondary | ICD-10-CM | POA: Diagnosis not present

## 2017-01-31 DIAGNOSIS — R801 Persistent proteinuria, unspecified: Secondary | ICD-10-CM | POA: Diagnosis not present

## 2017-01-31 DIAGNOSIS — N183 Chronic kidney disease, stage 3 (moderate): Secondary | ICD-10-CM | POA: Diagnosis not present

## 2017-01-31 DIAGNOSIS — Z1231 Encounter for screening mammogram for malignant neoplasm of breast: Secondary | ICD-10-CM | POA: Diagnosis not present

## 2017-01-31 MED ORDER — AMBULATORY NON FORMULARY MEDICATION: 0 refills | Status: AC

## 2017-01-31 NOTE — Progress Notes (Unsigned)
rx printed.Halvor Behrend Lynetta  

## 2017-01-31 NOTE — Telephone Encounter (Addendum)
Pt lost her rx for trazadone. She needs something else. Her insurance will not allow another rx until next month.   Please advise

## 2017-02-01 ENCOUNTER — Telehealth: Payer: Self-pay

## 2017-02-01 ENCOUNTER — Telehealth: Payer: Self-pay | Admitting: Family Medicine

## 2017-02-01 DIAGNOSIS — G7000 Myasthenia gravis without (acute) exacerbation: Secondary | ICD-10-CM

## 2017-02-01 MED ORDER — TIZANIDINE HCL 6 MG PO CAPS: 6.0000 mg | ORAL_CAPSULE | Freq: Three times a day (TID) | ORAL | 3 refills | Status: DC

## 2017-02-01 NOTE — Telephone Encounter (Signed)
But of course

## 2017-02-01 NOTE — Telephone Encounter (Signed)
Pt would like to know if she can have a 90 day supply of zanaflex sent to [facility].Marland Kitchen Please advise.

## 2017-02-01 NOTE — Telephone Encounter (Signed)
There is an Rx printed for Pt for a large capacity enema bag. Pt is going to look up what pharmacy she wants this to be sent to and let us know. Until then, I will place Rx upfront.

## 2017-02-02 MED ORDER — HYDROXYZINE PAMOATE 25 MG PO CAPS: 25.0000 mg | ORAL_CAPSULE | Freq: Every day | ORAL | 0 refills | Status: DC

## 2017-02-02 NOTE — Telephone Encounter (Signed)
Spoke with pt. Per Dr. De Nurse, please inform pt a Rx for Vistaril 25mg , #45 was sent to pharmacy. Pt was informed this Rx will only be for 1 month. Pt verbalizes understanding.

## 2017-02-02 NOTE — Telephone Encounter (Signed)
Can take vistaril 25mg  ( 1-2 ) tablets at night . [facility] can dispense 30 tablets only for this month.  Trazadone when due

## 2017-02-03 ENCOUNTER — Telehealth: Payer: Self-pay | Admitting: Family Medicine

## 2017-02-03 DIAGNOSIS — E039 Hypothyroidism, unspecified: Secondary | ICD-10-CM

## 2017-02-03 MED ORDER — LEVOTHYROXINE SODIUM 125 MCG PO TABS: 125.0000 ug | ORAL_TABLET | Freq: Every day | ORAL | 0 refills | Status: DC

## 2017-02-03 NOTE — Telephone Encounter (Signed)
Please call patient: Mandy Shaw is a little too suppressed. I need to adjust her dose. I believe she is taking 125 g per day. If this is correct then have her decrease to half a tab one day a week and a whole tab the other 6 days a week and repeat each week for 6 weeks and then let's recheck Mandy Shaw. If she is taking it slightly differently than please let me know so that I can make a different adjustment to the regimen.

## 2017-02-03 NOTE — Telephone Encounter (Signed)
Pt returned clinic call, she is taking levothyroxine 125 MCG tablet. Advised Pt to decrease dose by cutting one tab in half once a week. Pt verbalized understanding. She does need a refill. Will send in and place lab order for her to complete in 6 weeks.

## 2017-02-03 NOTE — Telephone Encounter (Signed)
Left VM for Pt to return clinic call regarding TSH and Rx. Callback provided.

## 2017-02-07 ENCOUNTER — Encounter: Payer: Self-pay | Admitting: Family Medicine

## 2017-02-11 ENCOUNTER — Other Ambulatory Visit: Payer: Self-pay | Admitting: Sports Medicine

## 2017-02-11 DIAGNOSIS — G7000 Myasthenia gravis without (acute) exacerbation: Secondary | ICD-10-CM

## 2017-02-20 ENCOUNTER — Other Ambulatory Visit (HOSPITAL_COMMUNITY): Payer: Self-pay | Admitting: Psychiatry

## 2017-02-21 ENCOUNTER — Encounter: Payer: Self-pay | Admitting: Podiatry

## 2017-02-21 ENCOUNTER — Other Ambulatory Visit (HOSPITAL_COMMUNITY): Payer: Self-pay | Admitting: Psychiatry

## 2017-02-21 ENCOUNTER — Ambulatory Visit (INDEPENDENT_AMBULATORY_CARE_PROVIDER_SITE_OTHER): Payer: Medicare Other | Admitting: Podiatry

## 2017-02-21 DIAGNOSIS — L600 Ingrowing nail: Secondary | ICD-10-CM | POA: Diagnosis not present

## 2017-02-21 DIAGNOSIS — M79671 Pain in right foot: Secondary | ICD-10-CM

## 2017-02-21 DIAGNOSIS — B351 Tinea unguium: Secondary | ICD-10-CM | POA: Diagnosis not present

## 2017-02-21 DIAGNOSIS — M79672 Pain in left foot: Secondary | ICD-10-CM | POA: Diagnosis not present

## 2017-02-21 NOTE — Addendum Note (Signed)
Addendum  created 02/21/17 1206 by Myrtie Soman, MD   Sign clinical note

## 2017-02-21 NOTE — Progress Notes (Signed)
SUBJECTIVE: 56 y.o. year old female presents requesting painful ingrown nails trimmed.  S/P Carpal tunnel surgery on right 11/24/16 and recovering well. Still wearing wrist brace.  HPI: Need be seen q 6-8 weeks for painful calluses and ingrown nails.  Co existing condition: Myasthenia Gravis, Fibromyalgia.  OBJECTIVE: DERMATOLOGIC EXAMINATION: Deformed painfulhallucal nails bilateral. No calluses noted today. VASCULAR EXAMINATION OF LOWER LIMBS: All pedal pulses are faintly palpable. Capillary Filling times within 3 seconds in all digits.  Temperature gradient from tibial crest to dorsum of foot is within normal bilateral. NEUROLOGIC EXAMINATION OF THE LOWER LIMBS: Subjective burning pain at dorsum of both feet, gets worse after prolonged ambulation.  Pain at rest in bed or sitting in chair. Some times pain is when the legs are crossed.  Has had failed to respond to Monofilament sensory testing with normal vibratory sensory.  MUSCULOSKELETAL EXAMINATION: High arched cavus type foot bilateral. Plantar flexed 5th metatarsal right.  ASSESSMENT: 1. 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  2. Symptomatic ingrown nails both great toes due to residual nail growth following nail ingrown nail surgery. 3. Peripheral neuropathy. 4. Pain in lower limb.   PLAN: Ingrown hallucal nails and all nails debrided. Pain was relieved.

## 2017-02-21 NOTE — Patient Instructions (Signed)
Seen for painful ingrown nails. All nails debrided. Return in 6 weeks.

## 2017-02-22 DIAGNOSIS — M25531 Pain in right wrist: Secondary | ICD-10-CM | POA: Diagnosis not present

## 2017-02-22 DIAGNOSIS — M25532 Pain in left wrist: Secondary | ICD-10-CM | POA: Diagnosis not present

## 2017-02-25 ENCOUNTER — Telehealth (HOSPITAL_COMMUNITY): Payer: Self-pay | Admitting: Psychiatry

## 2017-02-25 ENCOUNTER — Ambulatory Visit: Payer: Self-pay | Admitting: Family Medicine

## 2017-02-25 ENCOUNTER — Encounter: Payer: Self-pay | Admitting: Family Medicine

## 2017-02-25 ENCOUNTER — Ambulatory Visit (INDEPENDENT_AMBULATORY_CARE_PROVIDER_SITE_OTHER): Payer: BLUE CROSS/BLUE SHIELD | Admitting: Family Medicine

## 2017-02-25 VITALS — BP 81/52 | HR 89 | Ht 64.96 in | Wt 129.0 lb

## 2017-02-25 DIAGNOSIS — E039 Hypothyroidism, unspecified: Secondary | ICD-10-CM

## 2017-02-25 DIAGNOSIS — L299 Pruritus, unspecified: Secondary | ICD-10-CM

## 2017-02-25 DIAGNOSIS — G7000 Myasthenia gravis without (acute) exacerbation: Secondary | ICD-10-CM | POA: Diagnosis not present

## 2017-02-25 MED ORDER — MIRTAZAPINE 7.5 MG PO TABS: 7.5000 mg | ORAL_TABLET | Freq: Every day | ORAL | 0 refills | Status: DC

## 2017-02-25 MED ORDER — MONTELUKAST SODIUM 10 MG PO TABS: 10.0000 mg | ORAL_TABLET | Freq: Every day | ORAL | 0 refills | Status: DC

## 2017-02-25 NOTE — Progress Notes (Signed)
Subjective:    CC: Itching all over.  HPI:  Says home health nurse told her her right ear is Impacted says she wanted to have it examined today. She's not having any problems with the ear.  She also complains of itching over her skin that started about a week ago. She is not actually seen a physical rash. She's not noticed any changes in the skin such as bumps or dryness or scaling. But she exhibits intensely itchy over her chest abdomen arms and inner thighs and some up onto her neck. She's been trying oral Benadryl without relief. Took up to 50 mg. And she's also been trying some topical hydrocortisone without relief as well. She has not changed any soaps, lotions, perfumes, detergents etc.  She did want to let me know that she has changed her Neurologists. She is no longer seeing Dr. Trula Ore is seeing Dr. Berdine Addison.  Myasthenia Audrie Lia is now on a new medication called Mestinon 15 mg 3 times a day. She wonders if that could also be causing her itching.   Hypothyroidism-her TSH came back a little too suppressed recently. We decided to adjust her dose of that she would take one half tab one day a week and a whole tab all the other days a week. Recommend recheck level in 6 weeks.  Past medical history, Surgical history, Family history not pertinant except as noted below, Social history, Allergies, and medications have been entered into the medical record, reviewed, and corrections made.   Review of Systems: No fevers, chills, night sweats, weight loss, chest pain, or shortness of breath.   Objective:    General: Well Developed, well nourished, and in no acute distress.  Neuro: Alert and oriented x3, extra-ocular muscles intact, sensation grossly intact.  HEENT: Normocephalic, atraumatic. TMs and canals are clear bilaterally. She does have a small amount of wax in each ear but it's definitely not impacted. Skin: Warm and dry, no rashes. There are no papules or bumps or dryness to her skin. It  looks well moisturized. Cardiac: Regular rate and rhythm, no murmurs rubs or gallops, no lower extremity edema.  Respiratory: Clear to auscultation bilaterally. Not using accessory muscles, speaking in full sentences.   Impression and Recommendations:    Pruritus-unclear etiology at this point. Recommend a trial of Zyrtec and Singulair. Recommend check a CBC with differential to see if her eosinophils are elevated. Her new medication should not be causing the itching. But it can cause dry skin and 2% of people per up-to-date. But really her skin does not look dry and she moisturizes well.  Hypothyroidism-due to recheck thyroid in about 3 weeks.  Myasthenia gravis-we'll update her medication list.    Reassured her that ear exam is normal.

## 2017-02-25 NOTE — Telephone Encounter (Signed)
remeron prescription sent

## 2017-02-25 NOTE — Patient Instructions (Signed)
Start Zyrtec 10 mg. Take 1 twice a day. Also start the Singulair. This is a prescription Pickup from the pharmacy. Take 1 a day. , If you're not noticing improvement after the next 5-6 days.

## 2017-02-25 NOTE — Telephone Encounter (Signed)
Pt needs refill on rameron sent to [facility] in [place]  Please advise.

## 2017-02-28 LAB — CBC AND DIFFERENTIAL
HCT: 43 (ref 36–46)
Hemoglobin: 14.5 (ref 12.0–16.0)
NEUTROS ABS: 4
PLATELETS: 217 (ref 150–399)
WBC: 5.6

## 2017-02-28 NOTE — Telephone Encounter (Signed)
Received fax from [facility] requesting a refill for Abilify and Trazodone. Per Dr. De Nurse, refill is authorize for Abilify 10mg , #30 and Trazodone 150mg , #30. Rx was sent to pharmacy. Pt's next apt is schedule on 03/24/17. Called and informed pt of refill status. Pt verbalizes understanding.

## 2017-03-01 ENCOUNTER — Telehealth: Payer: Self-pay | Admitting: *Deleted

## 2017-03-01 DIAGNOSIS — L299 Pruritus, unspecified: Secondary | ICD-10-CM

## 2017-03-01 LAB — CBC WITH DIFFERENTIAL/PLATELET
BASOS: 1 %
Basophils Absolute: 0.1 10*3/uL (ref 0.0–0.2)
EOS (ABSOLUTE): 0.2 10*3/uL (ref 0.0–0.4)
EOS: 4 %
HEMATOCRIT: 43.3 % (ref 34.0–46.6)
HEMOGLOBIN: 14.5 g/dL (ref 11.1–15.9)
IMMATURE GRANS (ABS): 0 10*3/uL (ref 0.0–0.1)
IMMATURE GRANULOCYTES: 0 %
Lymphocytes Absolute: 1.3 10*3/uL (ref 0.7–3.1)
Lymphs: 23 %
MCH: 32.4 pg (ref 26.6–33.0)
MCHC: 33.5 g/dL (ref 31.5–35.7)
MCV: 97 fL (ref 79–97)
MONOCYTES: 10 %
Monocytes Absolute: 0.5 10*3/uL (ref 0.1–0.9)
NEUTROS PCT: 62 %
Neutrophils Absolute: 3.5 10*3/uL (ref 1.4–7.0)
PLATELETS: 217 10*3/uL (ref 150–379)
RBC: 4.47 x10E6/uL (ref 3.77–5.28)
RDW: 13.5 % (ref 12.3–15.4)
WBC: 5.6 10*3/uL (ref 3.4–10.8)

## 2017-03-01 NOTE — Telephone Encounter (Signed)
Referral

## 2017-03-02 ENCOUNTER — Telehealth: Payer: Self-pay | Admitting: Family Medicine

## 2017-03-02 NOTE — Telephone Encounter (Signed)
Call patient: Blood count looks great. Hemoglobin is normal at 14.5 and platelets are normal as well.

## 2017-03-03 NOTE — Telephone Encounter (Signed)
Pt informed of results.Prathik Aman Lynetta  

## 2017-03-08 ENCOUNTER — Other Ambulatory Visit: Payer: Self-pay | Admitting: Family Medicine

## 2017-03-08 DIAGNOSIS — G7000 Myasthenia gravis without (acute) exacerbation: Secondary | ICD-10-CM | POA: Diagnosis not present

## 2017-03-08 DIAGNOSIS — E039 Hypothyroidism, unspecified: Secondary | ICD-10-CM | POA: Diagnosis not present

## 2017-03-08 DIAGNOSIS — L299 Pruritus, unspecified: Secondary | ICD-10-CM | POA: Diagnosis not present

## 2017-03-08 LAB — TSH: TSH: 2.04 (ref <=5.90)

## 2017-03-09 ENCOUNTER — Other Ambulatory Visit: Payer: Self-pay | Admitting: Family Medicine

## 2017-03-09 LAB — TSH: TSH: 2.04 u[IU]/mL (ref 0.450–4.500)

## 2017-03-16 ENCOUNTER — Encounter: Payer: Self-pay | Admitting: Family Medicine

## 2017-03-17 ENCOUNTER — Other Ambulatory Visit: Payer: Self-pay | Admitting: Family Medicine

## 2017-03-17 ENCOUNTER — Other Ambulatory Visit (HOSPITAL_COMMUNITY): Payer: Self-pay | Admitting: Psychiatry

## 2017-03-17 NOTE — Telephone Encounter (Signed)
Medication refill- received fax from [facility] requesting a refill Lamictal. Per Dr. De Nurse, refill is authorize for Lamictal 200mg , #30. Rx was sent to pharmacy. Pt's next apt is schedule on 03/24/17. Nothing further is need at this time.

## 2017-03-19 ENCOUNTER — Other Ambulatory Visit: Payer: Self-pay | Admitting: Family Medicine

## 2017-03-21 ENCOUNTER — Ambulatory Visit: Payer: Self-pay | Admitting: Family Medicine

## 2017-03-21 ENCOUNTER — Other Ambulatory Visit: Payer: Self-pay | Admitting: Surgery

## 2017-03-21 DIAGNOSIS — R2243 Localized swelling, mass and lump, lower limb, bilateral: Secondary | ICD-10-CM

## 2017-03-24 ENCOUNTER — Ambulatory Visit (HOSPITAL_COMMUNITY): Payer: Self-pay | Admitting: Psychiatry

## 2017-03-24 DIAGNOSIS — G5602 Carpal tunnel syndrome, left upper limb: Secondary | ICD-10-CM | POA: Diagnosis not present

## 2017-03-25 ENCOUNTER — Ambulatory Visit (INDEPENDENT_AMBULATORY_CARE_PROVIDER_SITE_OTHER): Payer: BLUE CROSS/BLUE SHIELD | Admitting: Family Medicine

## 2017-03-25 VITALS — BP 101/59 | HR 74 | Wt 135.0 lb

## 2017-03-25 DIAGNOSIS — Z6822 Body mass index (BMI) 22.0-22.9, adult: Secondary | ICD-10-CM | POA: Diagnosis not present

## 2017-03-25 DIAGNOSIS — R635 Abnormal weight gain: Secondary | ICD-10-CM

## 2017-03-25 DIAGNOSIS — N183 Chronic kidney disease, stage 3 unspecified: Secondary | ICD-10-CM

## 2017-03-25 NOTE — Patient Instructions (Addendum)
Decrease hydrochlorothiazide down to once a day. Recheck kidneys at that end of next week.  Try tracking calories with [employer] [place] phone app

## 2017-03-25 NOTE — Progress Notes (Signed)
Subjective:    Patient ID: Mandy Shaw, female    DOB: 04-Nov-1960, 56 y.o.   MRN: [id number]  HPI 44 are female comes in today because she's been gaining weight over the last 2 months. She denies any recent changes in diet etc. She was previously on really high doses of topiramate and was actually losing too much weight. Infectious actually got down to 90 pounds. They stopped her medication for a short period of time and then restarted at a lower strength and she has been doing well but over the last several months has been slowly gaining weight again. She was here just about a month ago and has Artie gained back 6 pounds. We recently just checked her TSH and it was perfect at 2.0. She has been working out at the gym past days per week. She feels like she drink plenty of water. And she doesn't feel like she is eating any excess calories. She does feel like overall she is eating pretty healthy. She doesn't know exactly how many calories she is eating better.   She also has a history of CKD 3. She said she recently saw a nurse practitioner at [facility] neurology and had her renal function checked and they told her that it was not good. She has no CKD with elevated BUN. She does follow with nephrology. I believe she only goes once a year. She thinks her next appointment is in October.  Review of Systems  BP (!) 101/59   Pulse 74   Wt 135 lb (61.2 kg)   LMP 06/20/2014   BMI 22.49 kg/m     Allergies  Allergen Reactions  . Albuterol Anaphylaxis  . Other Anaphylaxis    MANGO, PARSLEY.  [name] Seed Anaphylaxis  . Penicillins Anaphylaxis    Has patient had a PCN reaction causing immediate rash, facial/tongue/throat swelling, SOB or lightheadedness with hypotension: Yes Has patient had a PCN reaction causing severe rash involving mucus membranes or skin necrosis: Yes Has patient had a PCN reaction that required hospitalization No Has patient had a PCN reaction occurring within the last 10  years: No If all of the above answers are "NO", then may proceed with Cephalosporin use.   . Silica [Nutritional Supplements] Itching  . Sulfonamide Derivatives Anaphylaxis  . Linzess [Linaclotide] Other (See Comments)    dizziness  . Macrodantin [Nitrofurantoin Macrocrystal] Itching  . Neurontin [Gabapentin] Other (See Comments)    Unable to walk or talk   . Pregabalin Other (See Comments)    dizziness    Past Medical History:  Diagnosis Date  . Anxiety   . Arthritis   . Bipolar 1 disorder ([place])   . Chronic kidney disease    stage 3 kidney disease  . COPD (chronic obstructive pulmonary disease) ([place])   . Dyspnea   . Foot drop, right foot   . GERD (gastroesophageal reflux disease)   . Hypothyroidism   . Insomnia   . Myasthenia gravis ([place])   . 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 Personality disorder   . Thyroid disease     Past Surgical History:  Procedure Laterality Date  . CARDIAC SURGERY     flap on heart removed  . CARPAL TUNNEL RELEASE Right 11/24/2016   Procedure: RIGHT CARPAL TUNNEL RELEASE;  Surgeon: Latanya Maudlin, MD;  Location: [facility];  Service: Orthopedics;  Laterality: Right;  . CESAREAN SECTION    . CHOLECYSTECTOMY    . CONDYLOMA  EXCISION/FULGURATION    . EYE SURGERY     bil cataracts  . INCONTINENCE SURGERY    . INDUCED ABORTION    . LAPAROTOMY     exploratory for pain  . LEEP    . Left hip surgery  01/09/14   Dr. Arnette Norris  . right hip surgery  09/26/2013   Dr. Arnette Norris  . SHOULDER SURGERY Right 2015   Dr. Margretta Sidle  . THYROIDECTOMY    . vaginal mesh     x 2    Social History   Social History  . Marital status: Married    Spouse name: N/A  . Number of children: 1  . Years of education: N/A   Occupational History  . disabled    Social History Main Topics  . Smoking status: Former Smoker    Packs/day: 1.50    Years: 40.00    Types: Cigarettes    Quit date: 10/22/2015  . Smokeless tobacco: Never Used   . Alcohol use 0.0 oz/week     Comment: 1-2 drinks yearly   . Drug use: No  . Sexual activity: Yes    Partners: Male     Comment: married, 1 daughter, 1  1/2 yrs college, self-employed, no reg exercise, 3 caffeine drinks daily.   Other Topics Concern  . Not on file   Social History Narrative   Husband is legally blind. Likes spending weekends at [employer] in [place]. Working our regularly.     Family History  Problem Relation Age of Onset  . Alcohol abuse Father     Outpatient Encounter Prescriptions as of 03/25/2017  Medication Sig  . AMBULATORY NON FORMULARY MEDICATION Medication Name: Large capacity Enema bag Dx:k59.00  . ARIPiprazole (ABILIFY) 10 MG tablet take 1 tablet by mouth once daily  . azaTHIOprine (IMURAN) 50 MG tablet Take 150 mg by mouth See admin instructions. Takes 3 tablets at bedtime  . Calcium Carb-Cholecalciferol (CALCIUM 600 + D PO) Take 1 tablet by mouth 2 (two) times daily.   . capsicum (ZOSTRIX) 0.075 % topical cream Apply 1 application topically 2 (two) times daily.  Marland Kitchen CINNAMON PO Take by mouth.  . docusate sodium (COLACE) 100 MG capsule Take 300 mg by mouth at bedtime.  . DULoxetine (CYMBALTA) 60 MG capsule Take 2 capsules (120 mg total) by mouth daily.  . hydrochlorothiazide (HYDRODIURIL) 25 MG tablet Take 25 mg by mouth 2 (two) times daily.  Marland Kitchen ipratropium (ATROVENT HFA) 17 MCG/ACT inhaler Inhale 2 puffs into the lungs every 6 (six) hours as needed for wheezing.  . lamoTRIgine (LAMICTAL) 200 MG tablet take 1 tablet by mouth daily  . levothyroxine (SYNTHROID, LEVOTHROID) 125 MCG tablet take 1 tablet by mouth once daily  . linaclotide (LINZESS) 290 MCG CAPS capsule Take 290 mcg by mouth 3 (three) times a week. as needed  . LINZESS 72 MCG capsule Take 72 mcg by mouth.  . mirtazapine (REMERON) 7.5 MG tablet Take 1 tablet (7.5 mg total) by mouth at bedtime. One at night  . Misc Natural Products (OSTEO BI-FLEX JOINT SHIELD PO) Take by mouth.  .  montelukast (SINGULAIR) 10 MG tablet take 1 tablet by mouth at bedtime  . Multiple Vitamins-Minerals (CENTRUM SILVER ADULT 50+ PO) Take 1 capsule by mouth daily.  . Omega-3 Fatty Acids (FISH OIL PO) Take by mouth.  . potassium chloride (K-DUR) 10 MEQ tablet take 1 tablet by mouth once daily  . primidone (MYSOLINE) 50 MG tablet Take 150 mg by mouth  daily.   Marland Kitchen pyridostigmine (MESTINON) 60 MG tablet Take 60 mg by mouth.  . RaNITidine HCl (ZANTAC PO) Take by mouth.  . rizatriptan (MAXALT) 10 MG tablet take 1 tablet by mouth AT ONSET OF HEADACHE. may repeat after 2 h...  (REFER TO PRESCRIPTION NOTES).  Marland Kitchen senna (SENOKOT) 8.6 MG TABS tablet Take 2 tablets by mouth at bedtime.  . simvastatin (ZOCOR) 20 MG tablet take 1 tablet by mouth at bedtime  . tizanidine (ZANAFLEX) 6 MG capsule take 1 capsule by mouth three times a day (Patient taking differently: 4 times a day as needed)  . Topiramate ER (TROKENDI XR) 200 MG CP24 Take 200 mg by mouth daily.  . traZODone (DESYREL) 150 MG tablet take 1 tablet by mouth at bedtime  . [DISCONTINUED] famotidine (PEPCID) 20 MG tablet Take 1 tablet (20 mg total) by mouth daily.   No facility-administered encounter medications on file as of 03/25/2017.           Objective:   Physical Exam Physical Exam  Constitutional: She is oriented to person, place, and time. She appears well-developed and well-nourished.  HENT:  Head: Normocephalic and atraumatic.  Eyes: Conjunctivae and EOM are normal.  Cardiovascular: Normal rate.   Pulmonary/Chest: Effort normal.  Neurological: She is alert and oriented to person, place, and time.  Skin: Skin is dry. No pallor.  Psychiatric: She has a normal mood and affect. Her behavior is normal.  Vitals reviewed.       Assessment & Plan:  Abnormal weight gain-I want her to start tracking her calorie says she knows exactly how much she is eating to make sure that she's not overeating or under eating.. Recommended a smart phone  application call my fitness pal to help her do this for the next 3 weeks. I suspect that some of her weight gain is probably related to her medications as she is on several that have the potential to cause weight gain most of them are her psychiatric medications. None of them are new and they have not changed recently. Tries consider referring her to nutritionist to help her come up with the right dietary plan.  CKD 3 with recent increase in BUN and creatinine level.-encourage her to decrease her had chlorothiazide down to 1 time a day. She mostly takes it for lower extremity swelling. She typically takes 1 in the morning and a second one around 3 PM. Even though she still continues to have swelling I encouraged her to go down to once a day and we can recheck your BUN and creatinine at the end of next week. If it stays elevated then we'll plan to refer her back to her nephrologist sooner rather than later. She sees Dr. Ruthann Cancer.

## 2017-03-27 ENCOUNTER — Other Ambulatory Visit (HOSPITAL_COMMUNITY): Payer: Self-pay | Admitting: Psychiatry

## 2017-03-28 NOTE — Telephone Encounter (Signed)
Medication refill- received fax from [facility] requesting a refill for Remeron. Per Dr. De Nurse, refill is authorize for Remeron 7.5mg , #30. Rx was sent to pharmacy. Pt's next apt is schedule on 04/14/17. Called and informed pt of refill status. Pt verbalizes understanding.

## 2017-03-30 DIAGNOSIS — G5602 Carpal tunnel syndrome, left upper limb: Secondary | ICD-10-CM | POA: Diagnosis not present

## 2017-03-31 ENCOUNTER — Other Ambulatory Visit: Payer: Self-pay | Admitting: Family Medicine

## 2017-03-31 DIAGNOSIS — N183 Chronic kidney disease, stage 3 (moderate): Secondary | ICD-10-CM | POA: Diagnosis not present

## 2017-04-01 ENCOUNTER — Other Ambulatory Visit: Payer: Self-pay | Admitting: *Deleted

## 2017-04-01 ENCOUNTER — Telehealth: Payer: Self-pay

## 2017-04-01 LAB — BASIC METABOLIC PANEL
BUN / CREAT RATIO: 24 — AB (ref 9–23)
BUN: 35 mg/dL — AB (ref 6–24)
CHLORIDE: 101 mmol/L (ref 96–106)
CO2: 26 mmol/L (ref 20–29)
Calcium: 9.8 mg/dL (ref 8.7–10.2)
Creatinine, Ser: 1.43 mg/dL — ABNORMAL HIGH (ref 0.57–1.00)
GFR calc Af Amer: 47 mL/min/{1.73_m2} — ABNORMAL LOW (ref >59)
GFR calc non Af Amer: 41 mL/min/{1.73_m2} — ABNORMAL LOW (ref >59)
GLUCOSE: 94 mg/dL (ref 65–99)
Potassium: 4.9 mmol/L (ref 3.5–5.2)
SODIUM: 142 mmol/L (ref 134–144)

## 2017-04-01 LAB — AMBIG ABBREV BMP8 DEFAULT

## 2017-04-01 MED ORDER — TIZANIDINE HCL 4 MG PO TABS: 6.0000 mg | ORAL_TABLET | Freq: Three times a day (TID) | ORAL | 3 refills | Status: DC | PRN

## 2017-04-01 MED ORDER — MONTELUKAST SODIUM 10 MG PO TABS: 10.0000 mg | ORAL_TABLET | Freq: Every day | ORAL | 3 refills | Status: DC

## 2017-04-01 NOTE — Telephone Encounter (Signed)
Vonna Kotyk from [facility] called and stated that patient insurance will cover Zanaflex 4 mg and not the 6 mg without a PA. Please advise if you will switch the Rx to a 4 mg or if you want them to initiate a PA for the 6 mg. Shermaine Rivet,CMA

## 2017-04-01 NOTE — Telephone Encounter (Signed)
I'm going to send in 4 mg tablets, she can take 1.5 tablets.

## 2017-04-04 ENCOUNTER — Ambulatory Visit: Payer: Medicare Other | Admitting: Podiatry

## 2017-04-04 NOTE — Telephone Encounter (Signed)
Patient has been advised. Rhonda Cunningham,CMA  

## 2017-04-05 ENCOUNTER — Other Ambulatory Visit (HOSPITAL_COMMUNITY): Payer: Self-pay | Admitting: *Deleted

## 2017-04-05 MED ORDER — MIRTAZAPINE 7.5 MG PO TABS: 7.5000 mg | ORAL_TABLET | Freq: Every day | ORAL | 0 refills | Status: DC

## 2017-04-05 MED ORDER — TRAZODONE HCL 150 MG PO TABS: 150.0000 mg | ORAL_TABLET | Freq: Every day | ORAL | 0 refills | Status: DC

## 2017-04-05 MED ORDER — LAMOTRIGINE 200 MG PO TABS: 200.0000 mg | ORAL_TABLET | Freq: Every day | ORAL | 0 refills | Status: DC

## 2017-04-05 NOTE — Telephone Encounter (Signed)
Medication refill- received fax from [facility] requesting refills for Lamictal, Remeron, and Trazodone. Per Dr. De Nurse, refills are authorize for Lamictal 200mg , #30, Remeron 7.5mg , #30 and Trazodone 150mg , #30. Rx's were sent to pharmacy. Pt's next apt is schedule on 04/14/17. Lvm informing pt of refill status.

## 2017-04-07 ENCOUNTER — Telehealth (HOSPITAL_COMMUNITY): Payer: Self-pay | Admitting: *Deleted

## 2017-04-07 ENCOUNTER — Telehealth: Payer: Self-pay

## 2017-04-07 DIAGNOSIS — M797 Fibromyalgia: Secondary | ICD-10-CM

## 2017-04-07 DIAGNOSIS — M25551 Pain in right hip: Secondary | ICD-10-CM

## 2017-04-07 DIAGNOSIS — G5603 Carpal tunnel syndrome, bilateral upper limbs: Secondary | ICD-10-CM

## 2017-04-07 DIAGNOSIS — M25552 Pain in left hip: Principal | ICD-10-CM

## 2017-04-07 DIAGNOSIS — G7000 Myasthenia gravis without (acute) exacerbation: Secondary | ICD-10-CM

## 2017-04-07 DIAGNOSIS — M5416 Radiculopathy, lumbar region: Secondary | ICD-10-CM

## 2017-04-07 NOTE — Telephone Encounter (Signed)
Medication management- pt lvm stating Cymbalta prescription is too expensive. Pt would like another medication. Please send prescription to [facility]. Please advise.

## 2017-04-07 NOTE — Telephone Encounter (Signed)
Ok for referral to Dr. Kriste Basque.

## 2017-04-07 NOTE — Telephone Encounter (Signed)
Pt called stating she is in need of a new pain management provider and pt is requesting a female provider that you recommend. Please assist.

## 2017-04-08 NOTE — Telephone Encounter (Addendum)
Dr. Melinda Crutch for pain specialists at [facility]. Pt states she is in need of Tier 1 neurology provider as well and her list includes Dr. Manon Hilding [phone] (preferred female) but if not then she has Dr. Cammy Brochure [phone] (who meets her needs). Please advise

## 2017-04-08 NOTE — Telephone Encounter (Signed)
Ok to call [facility] and see if they have availability.  Were she may want to check with her insurance to find out who is in her preferred network. We don't really know those types of things and basically would just be guessing. There is also pain clinic at [facility] which is another option.

## 2017-04-08 NOTE — Telephone Encounter (Signed)
Provider is in pt non preferred list. Please advise.

## 2017-04-11 ENCOUNTER — Other Ambulatory Visit: Payer: Self-pay | Admitting: Family Medicine

## 2017-04-11 DIAGNOSIS — N183 Chronic kidney disease, stage 3 unspecified: Secondary | ICD-10-CM

## 2017-04-11 NOTE — Progress Notes (Signed)
rr

## 2017-04-11 NOTE — Telephone Encounter (Signed)
Need list of other med used before to change or adjust at time of visit

## 2017-04-12 NOTE — Telephone Encounter (Signed)
Medication management- return telephone call to pt.  Per dr. De Nurse, informed pt, provider will need a list of current meds before changes or adjustment will be made. Changes and adjustments are complete at the time of visit. Pt verbalizes understanding. Pt's next apt is schedule on 04/14/17.

## 2017-04-14 ENCOUNTER — Encounter (HOSPITAL_COMMUNITY): Payer: Self-pay | Admitting: Psychiatry

## 2017-04-14 ENCOUNTER — Ambulatory Visit (INDEPENDENT_AMBULATORY_CARE_PROVIDER_SITE_OTHER): Payer: BLUE CROSS/BLUE SHIELD | Admitting: Psychiatry

## 2017-04-14 VITALS — BP 116/70 | HR 90 | Resp 16 | Ht 64.0 in | Wt 129.0 lb

## 2017-04-14 DIAGNOSIS — F4310 Post-traumatic stress disorder, unspecified: Secondary | ICD-10-CM | POA: Diagnosis not present

## 2017-04-14 DIAGNOSIS — F5102 Adjustment insomnia: Secondary | ICD-10-CM

## 2017-04-14 DIAGNOSIS — F319 Bipolar disorder, unspecified: Secondary | ICD-10-CM | POA: Diagnosis not present

## 2017-04-14 DIAGNOSIS — Z87891 Personal history of nicotine dependence: Secondary | ICD-10-CM

## 2017-04-14 DIAGNOSIS — Z811 Family history of alcohol abuse and dependence: Secondary | ICD-10-CM

## 2017-04-14 DIAGNOSIS — F411 Generalized anxiety disorder: Secondary | ICD-10-CM

## 2017-04-14 MED ORDER — LAMOTRIGINE 200 MG PO TABS: 200.0000 mg | ORAL_TABLET | Freq: Every day | ORAL | 0 refills | Status: DC

## 2017-04-14 MED ORDER — TRAZODONE HCL 150 MG PO TABS: 150.0000 mg | ORAL_TABLET | Freq: Every day | ORAL | 0 refills | Status: DC

## 2017-04-14 MED ORDER — MIRTAZAPINE 7.5 MG PO TABS: 7.5000 mg | ORAL_TABLET | Freq: Every day | ORAL | 0 refills | Status: DC

## 2017-04-14 NOTE — Progress Notes (Signed)
Patient ID: Mandy Shaw, female   DOB: Apr 15, 1961, 56 y.o.   MRN: [id number]  Psychiatric Outpatient Follow up visit  [name] MRN:  [id number] Date of Evaluation:  04/14/2017 Referral Source: Self/daughter  Chief Complaint:   Chief Complaint    Follow-up     Visit Diagnosis:    ICD-10-CM   1. Bipolar I disorder ([place]) F31.9   2. GAD (generalized anxiety disorder) F41.1   3. Adjustment insomnia F51.02    Diagnosis:   Patient Active Problem List   Diagnosis Date Noted  . Myasthenia gravis ([place]) [G70.00] 01/06/2017  . Current smoker [F17.200] 12/29/2016  . Constipation [K59.00] 11/09/2016  . Aortic atherosclerosis ([place]) [I70.0] 09/23/2016  . Fibromyalgia [M79.7] 09/06/2016  . Carpal tunnel syndrome, bilateral [G56.03] 08/09/2016  . Bilateral hip pain [M25.551, M25.552] 08/09/2016  . Trigger finger of all digits of both hands [M65.321, M65.331, M65.341, M65.311, M65.351, M65.322, M65.312, M65.332, M65.342, M65.352] 04/15/2016  . Underweight [R63.6] 10/22/2015  . Malnutrition, calorie ([place]) [E46] 10/22/2015  . Right lumbar radiculopathy [M54.16] 09/17/2015  . Microalbuminuria [R80.9] 08/22/2015  . Former smoker [F74.944] 09/17/2014  . Insomnia [G47.00] 04/09/2014  . 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 Hypothyroidism [E03.9] 07/05/2013  . Herpes [B00.9] 09/05/2012  . Hyperlipidemia [E78.5] 06/05/2010  . Bipolar disorder ([place]) [F31.9] 08/22/2009  . 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 PTSD [F43.10] 08/22/2009  . Migraine headache [G43.909] 08/22/2009  . KNEE PAIN [M25.569] 08/22/2009  . COPD (chronic obstructive pulmonary disease) with chronic bronchitis ([place]) [J44.9] 07/23/2009  . RHEUMATOID ARTHRITIS [M06.9] 07/23/2009   History of Present Illness:  56 years old currently married Caucasian female initially  referred by herself and her daughter she follows with Dr. Ward Chatters  upstairs as primary care. Has  been diagnosed bipolar and died a condition, PTSD disability since 60.   Patient is doing reasonable mood wise she is coming in for an early appointment to talk about medication and insurance company has changed. She is worried about the cost of Cymbalta was insurance company wants her to change the medication. Other prescription she wants to have a 90 day supply to be cost effective I cautioned about suicide history but she says that she will keep it in a locked box and her husband gets it every 1 week supply that gets out of the lock box Overall she is not paranoid she is taking care of her health she is not taking Abilify said that she was gaining some weight she is doing reasonable mood wise with the lamotrigine she takes trazodone at night Flashbacks are infrequent  Insomnia ; not worse on trazadone and remeron Aggravating factors: medical conditions. Husband is blind since 2012. Modifying factors is loving husband  Severe depression: 7.5/10 Duration: more then 20 years    Past Medical History:  Past Medical History:  Diagnosis Date  . Anxiety   . Arthritis   . Bipolar 1 disorder ([place])   . Chronic kidney disease    stage 3 kidney disease  . COPD (chronic obstructive pulmonary disease) ([place])   . Dyspnea   . Foot drop, right foot   . GERD (gastroesophageal reflux disease)   . Hypothyroidism   . Insomnia   . Myasthenia gravis ([place])   . 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 Personality disorder   . Thyroid disease     Past Surgical History:  Procedure Laterality  Date  . CARDIAC SURGERY     flap on heart removed  . CARPAL TUNNEL RELEASE Right 11/24/2016   Procedure: RIGHT CARPAL TUNNEL RELEASE;  Surgeon: Latanya Maudlin, MD;  Location: [facility];  Service: Orthopedics;  Laterality: Right;  . CESAREAN SECTION    . CHOLECYSTECTOMY    . CONDYLOMA EXCISION/FULGURATION    . EYE SURGERY     bil cataracts  . INCONTINENCE  SURGERY    . INDUCED ABORTION    . LAPAROTOMY     exploratory for pain  . LEEP    . Left hip surgery  01/09/14   Dr. Arnette Norris  . right hip surgery  09/26/2013   Dr. Arnette Norris  . SHOULDER SURGERY Right 2015   Dr. Margretta Sidle  . THYROIDECTOMY    . vaginal mesh     x 2   Family History:  Family History  Problem Relation Age of Onset  . Alcohol abuse Father    Social History:   Social History   Social History  . Marital status: Married    Spouse name: N/A  . Number of children: 1  . Years of education: N/A   Occupational History  . disabled    Social History Main Topics  . Smoking status: Former Smoker    Packs/day: 1.50    Years: 40.00    Types: Cigarettes    Quit date: 10/22/2015  . Smokeless tobacco: Never Used  . Alcohol use 0.0 oz/week     Comment: 1-2 drinks yearly   . Drug use: No  . Sexual activity: Yes    Partners: Male     Comment: married, 1 daughter, 1  1/2 yrs college, self-employed, no reg exercise, 3 caffeine drinks daily.   Other Topics Concern  . None   Social History Narrative   Husband is legally blind. Likes spending weekends at [employer] in [place]. Working our regularly.       Psychiatric Specialty Exam: HPI  Review of Systems  Constitutional: Negative for fever.  Cardiovascular: Negative for chest pain.  Musculoskeletal: Positive for myalgias.  Skin: Negative for itching.  Neurological: Negative for tremors.  Psychiatric/Behavioral: Negative for depression and suicidal ideas.    Blood pressure 116/70, pulse 90, resp. rate 16, height 5\' 4"  (1.626 m), weight 129 lb (58.5 kg), last menstrual period 06/20/2014, SpO2 92 %.Body mass index is 22.14 kg/m.  General Appearance: Casual  Eye Contact:  Fair  Speech:  Slow  Volume: normal  Mood: fair  Affect:  reactive  Thought Process:  Coherent  Orientation:  Full (Time, Place, and Person)  Thought Content:  Rumination  Suicidal Thoughts:  No  Homicidal Thoughts:  No  Memory:   Immediate;   Fair Recent;   Fair  Judgement:  Fair  Insight:  Shallow  Psychomotor Activity:  Decreased  Concentration:  Fair  Recall:  [employer] of Knowledge:Fair  Language: Fair  Akathisia:  Negative  Handed:  Right  AIMS (if indicated):    Assets:  Desire for Improvement Social Support  ADL's:  Intact  Cognition: WNL  Sleep:  Poor to variable     Allergies:   Allergies  Allergen Reactions  . Albuterol Anaphylaxis  . Other Anaphylaxis    MANGO, PARSLEY.  [name] Seed Anaphylaxis  . Penicillins Anaphylaxis    Has patient had a PCN reaction causing immediate rash, facial/tongue/throat swelling, SOB or lightheadedness with hypotension: Yes Has patient had a PCN reaction causing severe rash involving mucus membranes  or skin necrosis: Yes Has patient had a PCN reaction that required hospitalization No Has patient had a PCN reaction occurring within the last 10 years: No If all of the above answers are "NO", then may proceed with Cephalosporin use.   . Silica [Nutritional Supplements] Itching  . Sulfonamide Derivatives Anaphylaxis  . Linzess [Linaclotide] Other (See Comments)    dizziness  . Macrodantin [Nitrofurantoin Macrocrystal] Itching  . Neurontin [Gabapentin] Other (See Comments)    Unable to walk or talk   . Pregabalin Other (See Comments)    dizziness   Current Medications: Current Outpatient Prescriptions  Medication Sig Dispense Refill  . AMBULATORY NON FORMULARY MEDICATION Medication Name: Large capacity Enema bag Dx:k59.00 1 each 0  . azaTHIOprine (IMURAN) 50 MG tablet Take 150 mg by mouth See admin instructions. Takes 3 tablets at bedtime    . Calcium Carb-Cholecalciferol (CALCIUM 600 + D PO) Take 1 tablet by mouth 2 (two) times daily.     . capsicum (ZOSTRIX) 0.075 % topical cream Apply 1 application topically 2 (two) times daily. 28.3 g 0  . CINNAMON PO Take by mouth.    . docusate sodium (COLACE) 100 MG capsule Take 300 mg by mouth at bedtime.     . DULoxetine (CYMBALTA) 60 MG capsule Take 2 capsules (120 mg total) by mouth daily. 60 capsule 1  . hydrochlorothiazide (HYDRODIURIL) 25 MG tablet Take 25 mg by mouth 2 (two) times daily.  1  . ipratropium (ATROVENT HFA) 17 MCG/ACT inhaler Inhale 2 puffs into the lungs every 6 (six) hours as needed for wheezing. 1 Inhaler 2  . lamoTRIgine (LAMICTAL) 200 MG tablet Take 1 tablet (200 mg total) by mouth daily. 90 tablet 0  . levothyroxine (SYNTHROID, LEVOTHROID) 125 MCG tablet take 1 tablet by mouth once daily 30 tablet 3  . linaclotide (LINZESS) 290 MCG CAPS capsule Take 290 mcg by mouth 3 (three) times a week. as needed    . LINZESS 72 MCG capsule Take 72 mcg by mouth.  0  . mirtazapine (REMERON) 7.5 MG tablet Take 1 tablet (7.5 mg total) by mouth at bedtime. 90 tablet 0  . Misc Natural Products (OSTEO BI-FLEX JOINT SHIELD PO) Take by mouth.    . montelukast (SINGULAIR) 10 MG tablet Take 1 tablet (10 mg total) by mouth at bedtime. 90 tablet 3  . Multiple Vitamins-Minerals (CENTRUM SILVER ADULT 50+ PO) Take 1 capsule by mouth daily.    . Omega-3 Fatty Acids (FISH OIL PO) Take by mouth.    . potassium chloride (K-DUR) 10 MEQ tablet take 1 tablet by mouth once daily    . primidone (MYSOLINE) 50 MG tablet Take 150 mg by mouth daily.   0  . pyridostigmine (MESTINON) 60 MG tablet Take 60 mg by mouth.  0  . RaNITidine HCl (ZANTAC PO) Take by mouth.    . senna (SENOKOT) 8.6 MG TABS tablet Take 2 tablets by mouth at bedtime.    . simvastatin (ZOCOR) 20 MG tablet take 1 tablet by mouth at bedtime 90 tablet 3  . tiZANidine (ZANAFLEX) 4 MG tablet Take 1.5 tablets (6 mg total) by mouth every 8 (eight) hours as needed for muscle spasms. 135 tablet 3  . Topiramate ER (TROKENDI XR) 200 MG CP24 Take 200 mg by mouth daily.    . traZODone (DESYREL) 150 MG tablet Take 1 tablet (150 mg total) by mouth at bedtime. 90 tablet 0  . rizatriptan (MAXALT) 10 MG tablet take 1 tablet by mouth  AT ONSET OF HEADACHE. may  repeat after 2 h...  (REFER TO PRESCRIPTION NOTES).  0   No current facility-administered medications for this visit.       Treatment Plan Summary: Medication management and Plan as follows  1. Bipolar: baseline. Have stopped abilify. Continue lamictal. 90 days supply sent  2. PTSD: not worse. I'm reluctant to change Cymbalta since it also helps her fibromyalgia but patient is insisting because of cost problems and insurance may cost more than what she can afford we will try to contact her insurance company and if not a consider changing to Lexapro but there is a risk of relapse of depression and PTSD symptoms as well including fibromyalgia  3. Insomnia: baseline. Continue trazadone Avoid benzo. She is off from it for more then a year.   Mood does get effected by her arthritic pain or myalgia. Working with providers with injections and management  FU 2-3 months  [place], Lemma Tetro 7/26/20183:54 PM

## 2017-04-15 ENCOUNTER — Telehealth (HOSPITAL_COMMUNITY): Payer: Self-pay | Admitting: *Deleted

## 2017-04-15 NOTE — Telephone Encounter (Signed)
Medication management- pt was seen on 04/14/17. Pt states cost of duloxetine is too expensive.   Called [facility], spoke with [place]. Prior auth submit for Duloxetine. Prior Josem Kaufmann was submitted for clinical review, awaiting decision. Response will be faxed to [facility] location. PA#- [id number].

## 2017-04-18 MED ORDER — ESCITALOPRAM OXALATE 20 MG PO TABS: 20.0000 mg | ORAL_TABLET | Freq: Every day | ORAL | 0 refills | Status: DC

## 2017-04-18 NOTE — Telephone Encounter (Signed)
[id number] is denied. Per Dr. De Nurse, please send Lexapro 20mg , #45 to [facility]. Called and informed pt of new Rx and directions. Pt will begin Lexapro 20mg , increase dose to 30mg  after 1 week. Pt verbalizes understanding.

## 2017-04-25 ENCOUNTER — Ambulatory Visit: Payer: Medicare Other | Admitting: Podiatry

## 2017-05-02 ENCOUNTER — Ambulatory Visit (INDEPENDENT_AMBULATORY_CARE_PROVIDER_SITE_OTHER): Payer: Medicare Other | Admitting: Pediatrics

## 2017-05-02 ENCOUNTER — Encounter: Payer: Self-pay | Admitting: Pediatrics

## 2017-05-02 VITALS — BP 90/60 | HR 94 | Temp 98.1°F | Resp 16 | Ht 65.3 in | Wt 132.4 lb

## 2017-05-02 DIAGNOSIS — T63481D Toxic effect of venom of other arthropod, accidental (unintentional), subsequent encounter: Secondary | ICD-10-CM | POA: Diagnosis not present

## 2017-05-02 DIAGNOSIS — M791 Myalgia, unspecified site: Secondary | ICD-10-CM | POA: Insufficient documentation

## 2017-05-02 DIAGNOSIS — J4530 Mild persistent asthma, uncomplicated: Secondary | ICD-10-CM | POA: Insufficient documentation

## 2017-05-02 DIAGNOSIS — J410 Simple chronic bronchitis: Secondary | ICD-10-CM | POA: Diagnosis not present

## 2017-05-02 DIAGNOSIS — M7918 Myalgia, other site: Secondary | ICD-10-CM | POA: Insufficient documentation

## 2017-05-02 DIAGNOSIS — M542 Cervicalgia: Secondary | ICD-10-CM | POA: Insufficient documentation

## 2017-05-02 DIAGNOSIS — M069 Rheumatoid arthritis, unspecified: Secondary | ICD-10-CM

## 2017-05-02 DIAGNOSIS — M5136 Other intervertebral disc degeneration, lumbar region: Secondary | ICD-10-CM | POA: Insufficient documentation

## 2017-05-02 DIAGNOSIS — L508 Other urticaria: Secondary | ICD-10-CM | POA: Insufficient documentation

## 2017-05-02 DIAGNOSIS — G7000 Myasthenia gravis without (acute) exacerbation: Secondary | ICD-10-CM | POA: Diagnosis not present

## 2017-05-02 DIAGNOSIS — M5030 Other cervical disc degeneration, unspecified cervical region: Secondary | ICD-10-CM | POA: Insufficient documentation

## 2017-05-02 DIAGNOSIS — M5481 Occipital neuralgia: Secondary | ICD-10-CM | POA: Insufficient documentation

## 2017-05-02 DIAGNOSIS — M7550 Bursitis of unspecified shoulder: Secondary | ICD-10-CM | POA: Insufficient documentation

## 2017-05-02 DIAGNOSIS — T63481A Toxic effect of venom of other arthropod, accidental (unintentional), initial encounter: Secondary | ICD-10-CM | POA: Insufficient documentation

## 2017-05-02 DIAGNOSIS — M545 Low back pain, unspecified: Secondary | ICD-10-CM | POA: Insufficient documentation

## 2017-05-02 DIAGNOSIS — F3130 Bipolar disorder, current episode depressed, mild or moderate severity, unspecified: Secondary | ICD-10-CM | POA: Diagnosis not present

## 2017-05-02 DIAGNOSIS — F319 Bipolar disorder, unspecified: Secondary | ICD-10-CM | POA: Insufficient documentation

## 2017-05-02 MED ORDER — LEVALBUTEROL TARTRATE 45 MCG/ACT IN AERO: 2.0000 | INHALATION_SPRAY | Freq: Four times a day (QID) | RESPIRATORY_TRACT | 0 refills | Status: DC | PRN

## 2017-05-02 MED ORDER — MONTELUKAST SODIUM 10 MG PO TABS: 10.0000 mg | ORAL_TABLET | Freq: Every day | ORAL | 3 refills | Status: DC

## 2017-05-02 MED ORDER — RANITIDINE HCL 150 MG PO TABS: ORAL_TABLET | ORAL | 2 refills | Status: DC

## 2017-05-02 NOTE — Progress Notes (Signed)
[place] [address] Dept: [phone]  New Patient Note  Patient ID: Mandy Shaw, female    DOB: June 01, 1961  Age: 56 y.o. MRN: [id number] Date of Office Visit: 05/02/2017 Referring provider: Hali Marry, [address] [place], [place] [address]    Chief Complaint: Rash (very itchy skin. patient was allergy tested in March by [facility] in [place]  Dr. Pablo Lawrence.  all negative.  Dx as itching due to autoimmune disorder.)  HPI TACHE BOBST presents for evaluation of chronic daily itching since December 2017. She does have hives when she gets overheated. She has been on Benadryl 50 mg every 6 hours and an over-the-counter antihistamine on a daily basis. She also has tried Zyrtec. She has been on Singulair 10 mg once a day for several months. She has continued to itch She took prednisone for about 10 days and did not find dramatic relief. She saw an allergist in March of this year who could not find an allergy. 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 She has had an allergic reaction to albuterol consisting of shortness of breath and swelling of her throat. She has been diagnosed with COPD and finally has quit smoking. At age 32, she had an allergic reaction to an insect sting. She tried venom injections but could not  tolerate increased doses.She has myasthenia gravis ,  rheumatoid arthritis, gastroesophageal reflux and bipolar depression ,   Review of Systems  Constitutional: Negative.   HENT: Negative.   Eyes:       Bilateral lens implants  Respiratory:       COPD. Allergic reaction to albuterol  Cardiovascular:       History of irregular heart beat which resolved with taking out a tag from her heart  Gastrointestinal:       Recurrent bouts of abdominal pain. Cholecystectomy. Gastroesophageal reflux  Genitourinary:       Bladder sling surgery twice  Musculoskeletal:       Bilateral hip surgeries, right  shoulder surgery, bilateral carpal tunnel release . Suspected rheumatoid arthritis. Degenerative joint disease  Skin:       Itchy at times and hives since December 2017. 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 Migraine headaches  Endo/Heme/Allergies:       Thyroid surgery. Hypothyroidism. No diabetes  Psychiatric/Behavioral:       Bipolar depression    Outpatient Encounter Prescriptions as of 05/02/2017  Medication Sig  . AMBULATORY NON FORMULARY MEDICATION Medication Name: Large capacity Enema bag Dx:k59.00  . azaTHIOprine (IMURAN) 50 MG tablet Take 150 mg by mouth See admin instructions. Takes 3 tablets at bedtime  . Calcium Carb-Cholecalciferol (CALCIUM 600 + D PO) Take 1 tablet by mouth 2 (two) times daily.   . capsicum (ZOSTRIX) 0.075 % topical cream Apply 1 application topically 2 (two) times daily.  . CHROMIUM-CINNAMON PO Take by mouth.  Marland Kitchen CINNAMON PO Take by mouth.  . diclofenac sodium (VOLTAREN) 1 % GEL Place onto the skin.  Marland Kitchen docusate sodium (COLACE) 100 MG capsule Take 300 mg by mouth at bedtime.  . DULoxetine (CYMBALTA) 60 MG capsule Take 2 capsules (120 mg total) by mouth daily.  Marland Kitchen escitalopram (LEXAPRO) 20 MG tablet Take 1 tablet (20 mg total) by mouth daily. Take 1 tab (20 mg total) X 1 wk, increase to 1.5 tablets (30mg  total) by mouth daily.  . hydrochlorothiazide (HYDRODIURIL) 25  MG tablet Take 25 mg by mouth 2 (two) times daily.  Marland Kitchen ipratropium (ATROVENT HFA) 17 MCG/ACT inhaler Inhale 2 puffs into the lungs every 6 (six) hours as needed for wheezing.  . lamoTRIgine (LAMICTAL) 200 MG tablet Take 1 tablet (200 mg total) by mouth daily.  Marland Kitchen levothyroxine (SYNTHROID, LEVOTHROID) 125 MCG tablet take 1 tablet by mouth once daily  . linaclotide (LINZESS) 290 MCG CAPS capsule Take 290 mcg by mouth 3 (three) times a week. as needed  . LINZESS 72 MCG capsule Take 72 mcg by mouth.  . mirtazapine (REMERON) 7.5 MG tablet Take 1 tablet  (7.5 mg total) by mouth at bedtime.  . Misc Natural Products (OSTEO BI-FLEX JOINT SHIELD PO) Take by mouth.  . montelukast (SINGULAIR) 10 MG tablet Take 1 tablet (10 mg total) by mouth at bedtime.  . Multiple Vitamins-Minerals (CENTRUM SILVER ADULT 50+ PO) Take 1 capsule by mouth daily.  . Omega-3 Fatty Acids (FISH OIL PO) Take by mouth.  . potassium chloride (K-DUR) 10 MEQ tablet take 1 tablet by mouth once daily  . primidone (MYSOLINE) 50 MG tablet Take 150 mg by mouth daily.   Marland Kitchen pyridostigmine (MESTINON) 60 MG tablet Take 60 mg by mouth.  . rizatriptan (MAXALT) 10 MG tablet take 1 tablet by mouth AT ONSET OF HEADACHE. may repeat after 2 h...  (REFER TO PRESCRIPTION NOTES).  Marland Kitchen senna (SENOKOT) 8.6 MG TABS tablet Take 2 tablets by mouth at bedtime.  . simvastatin (ZOCOR) 20 MG tablet take 1 tablet by mouth at bedtime  . tiZANidine (ZANAFLEX) 4 MG tablet Take 1.5 tablets (6 mg total) by mouth every 8 (eight) hours as needed for muscle spasms.  . Topiramate ER (TROKENDI XR) 200 MG CP24 Take 200 mg by mouth daily.  . traZODone (DESYREL) 150 MG tablet Take 1 tablet (150 mg total) by mouth at bedtime.  . [DISCONTINUED] montelukast (SINGULAIR) 10 MG tablet Take 1 tablet (10 mg total) by mouth at bedtime.  . [DISCONTINUED] RaNITidine HCl (ZANTAC PO) Take by mouth.  . levalbuterol (XOPENEX HFA) 45 MCG/ACT inhaler Inhale 2 puffs into the lungs every 6 (six) hours as needed for wheezing.  . ranitidine (ZANTAC) 150 MG tablet One tablet twice a day for heartburn.  May help with hives.  . [DISCONTINUED] tizanidine (ZANAFLEX) 6 MG capsule take 1 capsule by mouth 3 TO 4 TIMES DAILY   No facility-administered encounter medications on file as of 05/02/2017.      Drug Allergies:  Allergies  Allergen Reactions  . Albuterol Anaphylaxis  . Other Anaphylaxis    MANGO, PARSLEY.  [name] Seed Anaphylaxis  . Penicillins Anaphylaxis    Has patient had a PCN reaction causing immediate rash, facial/tongue/throat  swelling, SOB or lightheadedness with hypotension: Yes Has patient had a PCN reaction causing severe rash involving mucus membranes or skin necrosis: Yes Has patient had a PCN reaction that required hospitalization No Has patient had a PCN reaction occurring within the last 10 years: No If all of the above answers are "NO", then may proceed with Cephalosporin use.   . Silica [Nutritional Supplements] Itching  . Sulfonamide Derivatives Anaphylaxis  . Linzess [Linaclotide] Other (See Comments)    dizziness  . Macrodantin [Nitrofurantoin Macrocrystal] Itching  . Neurontin [Gabapentin] Other (See Comments)    Unable to walk or talk   . Pregabalin Other (See Comments)    dizziness    Family History: Francely's family history includes Alcohol abuse in her father..Family history is negative for  asthma, hayfever, sinus problems, angioedema, eczema, hives and food allergies, lupus, chronic bronchitis or emphysema.  Social and environmental. She is depressed. She is disabled because of bipolar depression. She has been smoking cigarettes for 38 years but finally stopped.  Husband smokes outside. She has 3 dogs and a guide dog at home. Her husband is blind  Physical Exam: BP 90/60 (BP Location: Left Arm, Patient Position: Sitting, Cuff Size: Normal)   Pulse 94   Temp 98.1 F (36.7 C) (Oral)   Resp 16   Ht 5' 5.3" (1.659 m)   Wt 132 lb 6.4 oz (60.1 kg)   LMP 06/20/2014   SpO2 97%   BMI 21.83 kg/m    Physical Exam  Constitutional: She is oriented to person, place, and time. She appears well-developed and well-nourished.  HENT:  Eyes normal. Ears normal. Nose normal. Pharynx normal  Neck: Neck supple. No thyromegaly present.  Cardiovascular:  S1 and S2 normal no murmurs  Pulmonary/Chest:  Clear to percussion and auscultation  Abdominal:  Soft. Nonspecific tenderness in the entire abdomen. No hepatosplenomegaly  Lymphadenopathy:    She has no cervical adenopathy.  Neurological: She is  alert and oriented to person, place, and time.  Skin:  Mild dermatographia  Psychiatric: She has a normal mood and affect. Her behavior is normal. Judgment and thought content normal.  Vitals reviewed.   Diagnostics: FVC 3.88 L FEV1 2.60 L. Predicted FVC 3.53 L predicted FEV1 2.75 L. After Xopenex 2 puffs FVC 3.70 L FEV1 2.96 L-this shows a mild reduction in the FEV1 percent reversible to normal   Assessment  Assessment and Plan: 1. Mild persistent asthma without complication   2. Simple chronic bronchitis ([place])   3. Chronic urticaria   4. Rheumatoid arthritis, involving unspecified site, unspecified rheumatoid factor presence ([place])   5. Myasthenia gravis ([place])   6. Bipolar depression ([place])   7. Allergic reaction to insect sting, accidental or unintentional, subsequent encounter     Meds ordered this encounter  Medications  . ranitidine (ZANTAC) 150 MG tablet    Sig: One tablet twice a day for heartburn.  May help with hives.    Dispense:  180 tablet    Refill:  2    Dispense 90 days supply.  . montelukast (SINGULAIR) 10 MG tablet    Sig: Take 1 tablet (10 mg total) by mouth at bedtime.    Dispense:  90 tablet    Refill:  3    Hold rx. Pt will call for fill.  . levalbuterol (XOPENEX HFA) 45 MCG/ACT inhaler    Sig: Inhale 2 puffs into the lungs every 6 (six) hours as needed for wheezing.    Dispense:  3 Inhaler    Refill:  0    Dispense 90 days supply    Patient Instructions  Zyrtec 10 mg tablets-take 1 tablet twice a day for itching Ranitidine 150 mg-take 1 tablet twice a day for heartburn. It may help hives Montelukast  10 mg tablets-take 1 tablet once a day for coughing or wheezing Xopenex 2 puffs every 6 hours if needed for coughing or wheezing Do foods with salicylates make you itch? Let me know if you want to apply for Xolair administration with your insurance company Call me if you're not doing better on this treatment plan  If you have an insect sting, take  Benadryl 50 mg every 4 hours and if you have life-threatening symptoms inject  with EpiPen 0.3 mg   Return in about  4 weeks (around 05/30/2017).   Thank you for the opportunity to care for this patient.  Please do not hesitate to contact me with questions.  [name], M.D.  [facility] [address] [place], [place] [address] [phone]

## 2017-05-02 NOTE — Patient Instructions (Addendum)
Zyrtec 10 mg tablets-take 1 tablet twice a day for itching Ranitidine 150 mg-take 1 tablet twice a day for heartburn. It may help hives Montelukast  10 mg tablets-take 1 tablet once a day for coughing or wheezing Xopenex 2 puffs every 6 hours if needed for coughing or wheezing Do foods with salicylates make you itch? Let me know if you want to apply for Xolair administration with your insurance company Call me if you're not doing better on this treatment plan  If you have an insect sting, take Benadryl 50 mg every 4 hours and if you have life-threatening symptoms inject  with EpiPen 0.3 mg

## 2017-05-05 ENCOUNTER — Telehealth: Payer: Self-pay

## 2017-05-05 NOTE — Telephone Encounter (Signed)
Pt called in and stated that [facility] called her and said they will not cover the xopenex inhaler as it is not on her formulary. She was wanting to know what we could switch it too.  Please advise

## 2017-05-05 NOTE — Telephone Encounter (Signed)
File an appeal with [employer]. Anaphylaxis from albuterol

## 2017-05-06 NOTE — Telephone Encounter (Signed)
PA has been approved for the levalbuterol 45 mcg. [id number] thru 09-19-2017

## 2017-05-10 ENCOUNTER — Other Ambulatory Visit: Payer: Self-pay | Admitting: Allergy

## 2017-05-10 ENCOUNTER — Telehealth: Payer: Self-pay | Admitting: Allergy

## 2017-05-10 ENCOUNTER — Other Ambulatory Visit: Payer: Self-pay

## 2017-05-10 MED ORDER — LEVALBUTEROL TARTRATE 45 MCG/ACT IN AERO: INHALATION_SPRAY | RESPIRATORY_TRACT | 1 refills | Status: DC

## 2017-05-10 MED ORDER — LEVALBUTEROL TARTRATE 45 MCG/ACT IN AERO: 2.0000 | INHALATION_SPRAY | Freq: Four times a day (QID) | RESPIRATORY_TRACT | 0 refills | Status: DC | PRN

## 2017-05-10 NOTE — Telephone Encounter (Signed)
RX called in for levalbuterol HFA to new pharmacy [facility] in [place] per patient

## 2017-05-10 NOTE — Telephone Encounter (Signed)
Mandy Shaw called and wanted to know if she could use the Levalbuterol since she was allergic to albuterol and I informed her she could.

## 2017-05-11 ENCOUNTER — Telehealth: Payer: Self-pay

## 2017-05-11 MED ORDER — TIZANIDINE HCL 4 MG PO TABS: 6.0000 mg | ORAL_TABLET | Freq: Three times a day (TID) | ORAL | 3 refills | Status: DC | PRN

## 2017-05-11 NOTE — Telephone Encounter (Signed)
Pt would like 90 day supply of tizanidine sent to [facility]. Pt states if she can get 90 day supply she will have a $0 copay. Please advise.

## 2017-05-11 NOTE — Telephone Encounter (Signed)
Done

## 2017-05-12 NOTE — Telephone Encounter (Signed)
Pt.notified

## 2017-05-13 DIAGNOSIS — R1310 Dysphagia, unspecified: Secondary | ICD-10-CM | POA: Diagnosis not present

## 2017-05-13 DIAGNOSIS — Z86010 Personal history of colonic polyps: Secondary | ICD-10-CM | POA: Diagnosis not present

## 2017-05-13 DIAGNOSIS — R1084 Generalized abdominal pain: Secondary | ICD-10-CM | POA: Diagnosis not present

## 2017-05-13 DIAGNOSIS — K219 Gastro-esophageal reflux disease without esophagitis: Secondary | ICD-10-CM | POA: Diagnosis not present

## 2017-05-13 DIAGNOSIS — K5909 Other constipation: Secondary | ICD-10-CM | POA: Diagnosis not present

## 2017-05-16 ENCOUNTER — Telehealth (HOSPITAL_COMMUNITY): Payer: Self-pay | Admitting: Psychiatry

## 2017-05-16 NOTE — Telephone Encounter (Signed)
Pt states she was to call and give a report on the lexapro. She is now taking 1.5mg  and she is still hurting. She wants to know what De Nurse recommends now. Does she need to come back in and be seen, or does he want to up the dosage again?

## 2017-05-16 NOTE — Telephone Encounter (Signed)
cymbalta is better for fibromyalgia but she was having concerns with insurance. lexapro will not be the same. We can increase dose by half tablet . Start taking half tablet more.

## 2017-05-17 ENCOUNTER — Telehealth: Payer: Self-pay | Admitting: *Deleted

## 2017-05-17 MED ORDER — ESCITALOPRAM OXALATE 20 MG PO TABS: 40.0000 mg | ORAL_TABLET | Freq: Every day | ORAL | 0 refills | Status: DC

## 2017-05-17 NOTE — Telephone Encounter (Signed)
Advise to take 30mg  if she was taking 20mg  before.  Do not take 40mg  if she was on 20mg  before

## 2017-05-17 NOTE — Telephone Encounter (Signed)
Lvm for pt to return call to office. Per Dr. De Nurse, please inform pt Lexapro dose will increase from 30mg  to 40mg . A new prescription will be sent to [facility]. Pt's next apt is schedule on 06/23/17. Nothing further is need at this time.

## 2017-05-17 NOTE — Telephone Encounter (Signed)
Spoke to patient regarding cost and financial assistance for Xolair. Patient advised she would call back with financial so I can submit to [employer] to possibly get patient on free drug.

## 2017-05-17 NOTE — Telephone Encounter (Signed)
Pt return telephone call to office. Pt states she is currently taking 1.5 tablets (30mg  total) of Lexapro. Per conversation with Dr. De Nurse, pt may increase dose from 30mg  to 40mg  of Lexapro.   A new prescription will be sent to [facility]. Called and informed pt of medication change. Informed pt to follow up with office within 1-2 weeks. Pt verbalizes understanding. Nothing further is need at this time.

## 2017-05-17 NOTE — Telephone Encounter (Signed)
Okay 

## 2017-05-17 NOTE — Telephone Encounter (Signed)
L/m for patient to contact me again on 05/10/17 with no callback. Patient has not called back with information for patient assistance. I will assume she is not interested in pursuing therapy at this time.

## 2017-05-20 DIAGNOSIS — Z1231 Encounter for screening mammogram for malignant neoplasm of breast: Secondary | ICD-10-CM | POA: Diagnosis not present

## 2017-05-20 DIAGNOSIS — M797 Fibromyalgia: Secondary | ICD-10-CM | POA: Diagnosis not present

## 2017-05-20 DIAGNOSIS — G894 Chronic pain syndrome: Secondary | ICD-10-CM | POA: Diagnosis not present

## 2017-05-20 DIAGNOSIS — G7000 Myasthenia gravis without (acute) exacerbation: Secondary | ICD-10-CM | POA: Diagnosis not present

## 2017-05-24 ENCOUNTER — Telehealth (HOSPITAL_COMMUNITY): Payer: Self-pay | Admitting: Psychiatry

## 2017-05-24 NOTE — Telephone Encounter (Signed)
Pt needs 19m refill on REMERON sent to [facility].

## 2017-05-25 NOTE — Telephone Encounter (Signed)
30 day supply with refills for this medication.

## 2017-05-25 NOTE — Telephone Encounter (Signed)
Medication refill- pt call office requesting a 90 day supply of Remeron sent to [facility]. Last refill was sent on 04/14/17 for 1 month supply. Pt's next apt is schedule on 06/23/17. Please advise.

## 2017-05-25 NOTE — Telephone Encounter (Signed)
Medication management- pt called office requesting a 90 day supply for Remeron. Per Dr. De Nurse, refill request is denied.  Called and informed pt a 90 day supply of Remeron 7.5mg  was sent to pharmacy on 04/14/17. Next refill is due on 06/30/17. Pt shows understanding.

## 2017-05-31 IMAGING — CT CT CHEST LUNG CANCER SCREENING LOW DOSE W/O CM
2 of 5 series · 15 of 40 positions shown, 18 images · non-contrast
Comparison: None.

CLINICAL DATA: Ex-smoker, quit 1 year ago, 51 pack-year history,
lung cancer screening.

EXAM:
CT CHEST WITHOUT CONTRAST LOW-DOSE FOR LUNG CANCER SCREENING
TECHNIQUE: Multidetector CT imaging of the chest was performed following the
standard protocol without IV contrast.

[Series 3: lung thins 1.0 · axial · 0.66mm/px · z∈[-302,+4]mm · 12 of 338 slices shown, 15 images]
[im 16/338  mediastinal]
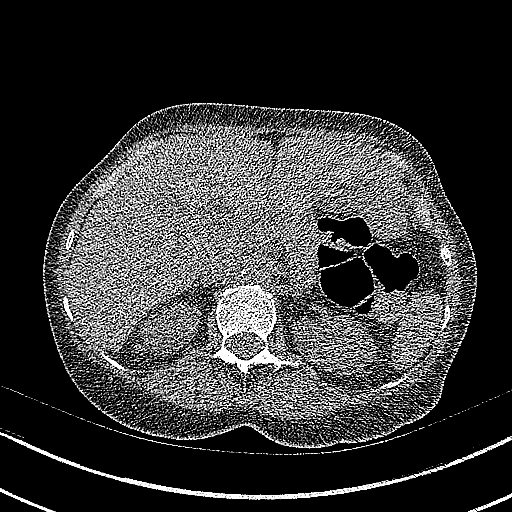
[im 16/338  lung]
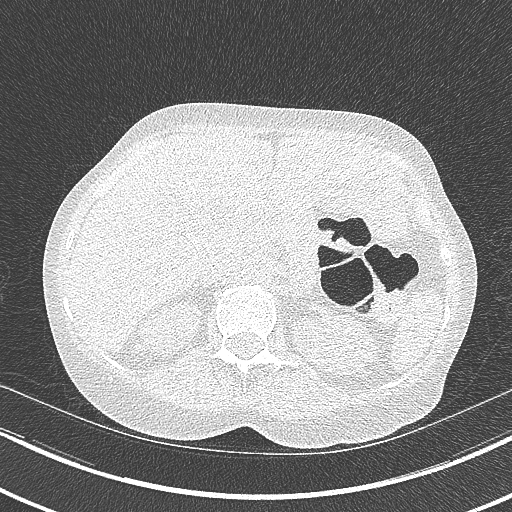
[im 46/338  lung]
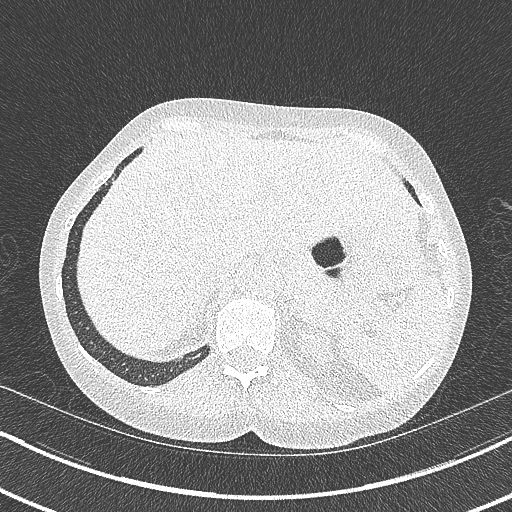
[im 77/338  lung]
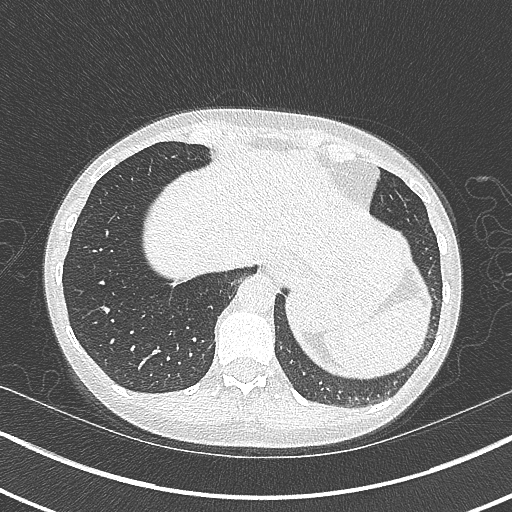
[im 108/338  lung]
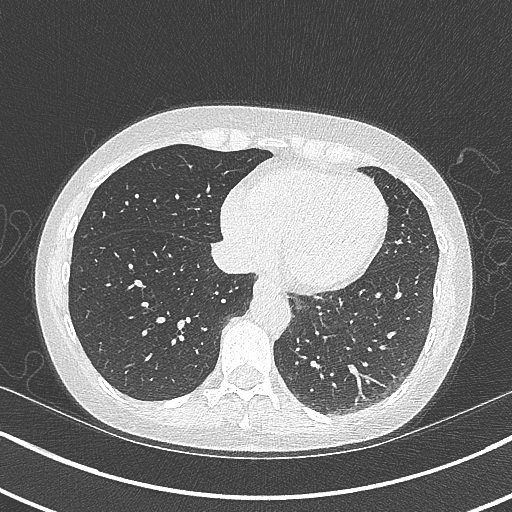
[im 123/338  mediastinal]
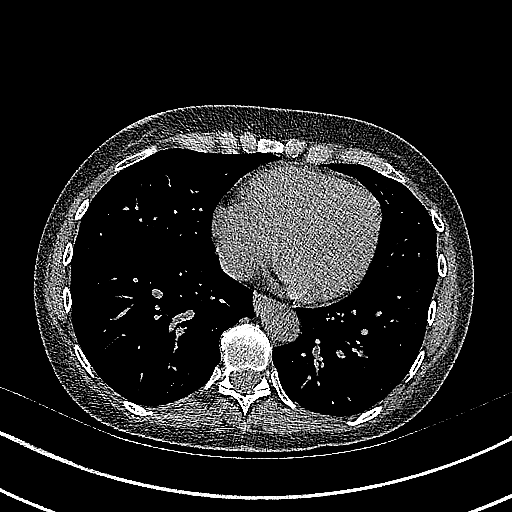
[im 123/338  lung]
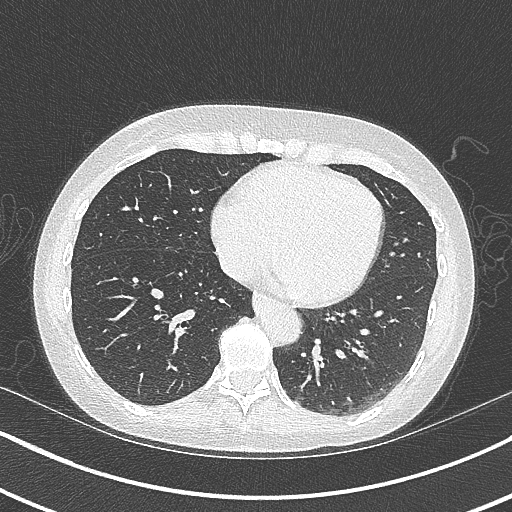
[im 154/338  lung]
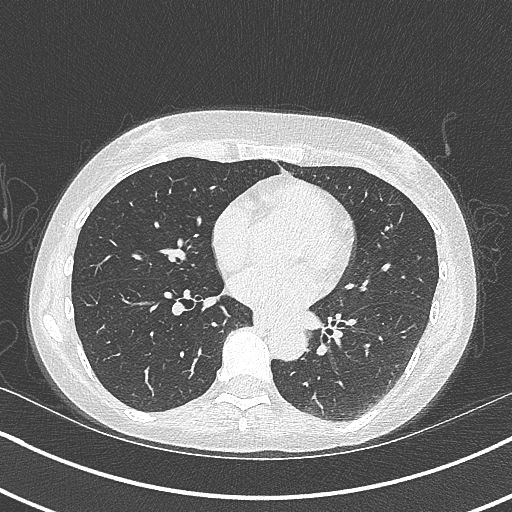
[im 184/338  lung]
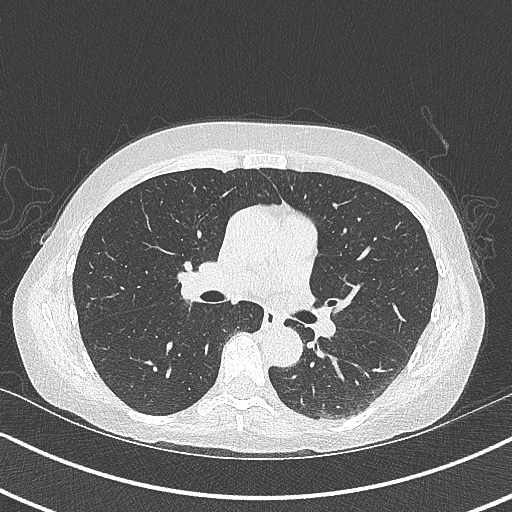
[im 215/338  lung]
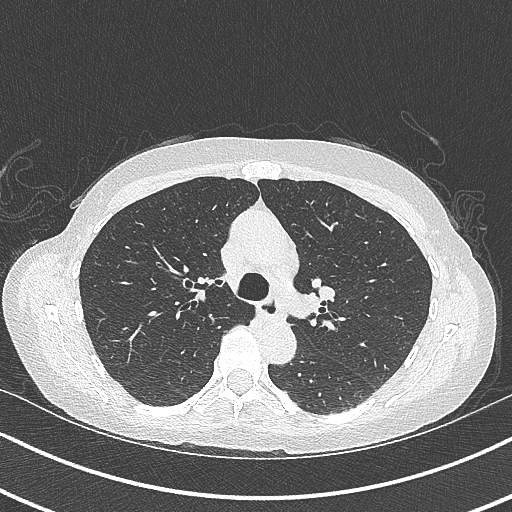
[im 230/338  mediastinal]
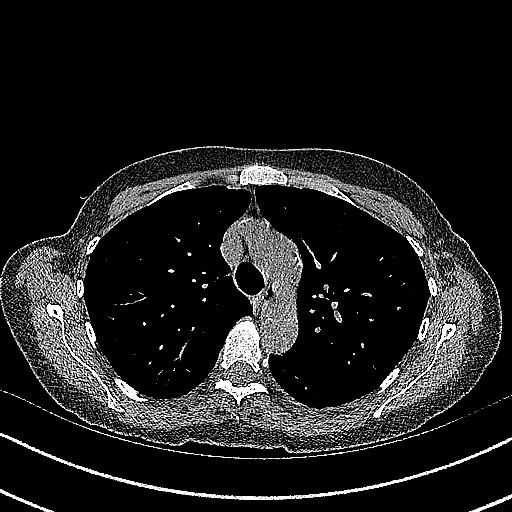
[im 230/338  lung]
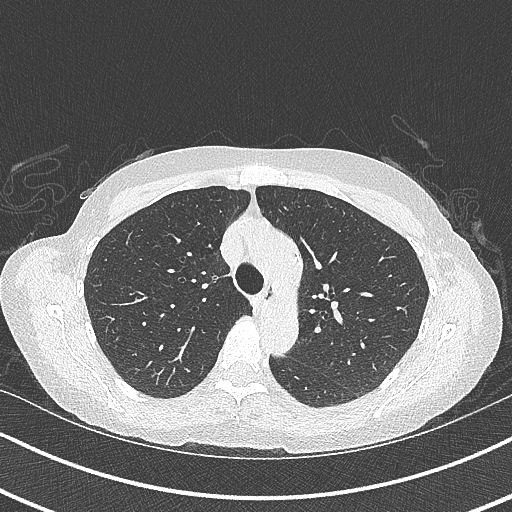
[im 261/338  lung]
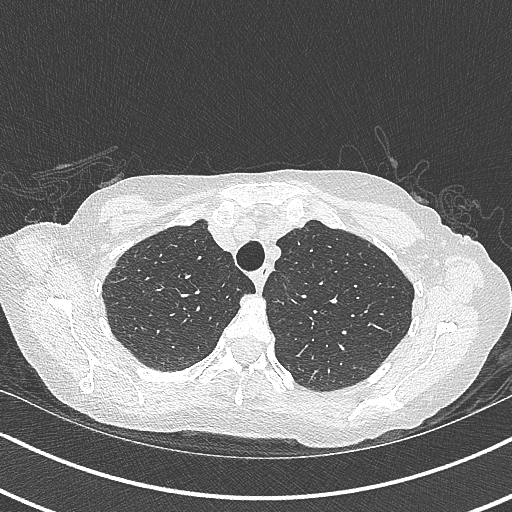
[im 292/338  lung]
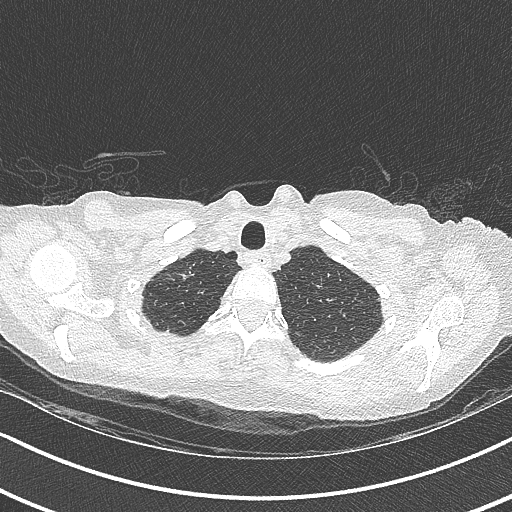
[im 322/338  lung]
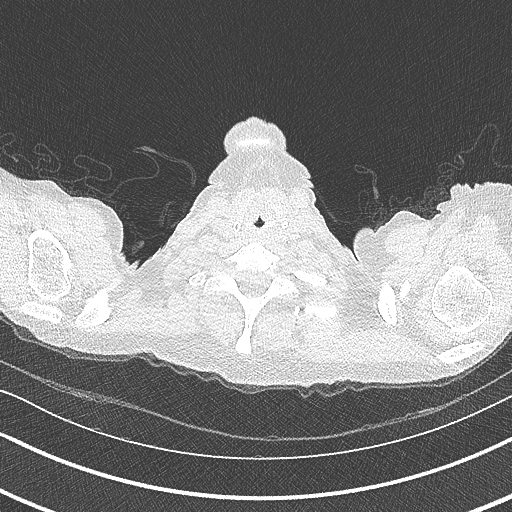

[Series 5: coronal · coronal · 0.59mm/px · 3 of 118 slices shown]
[im 24/118  lung]
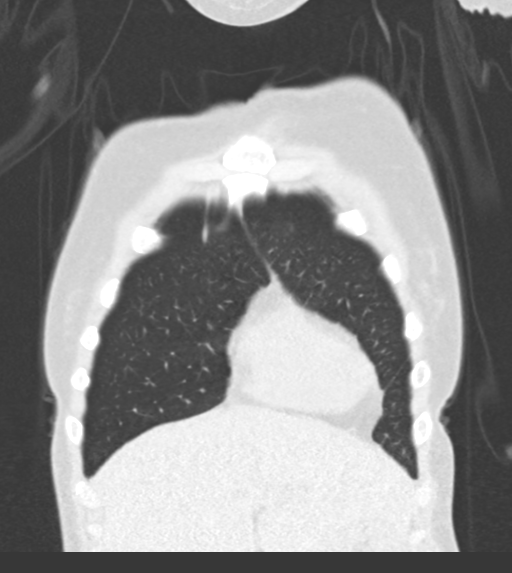
[im 47/118  lung]
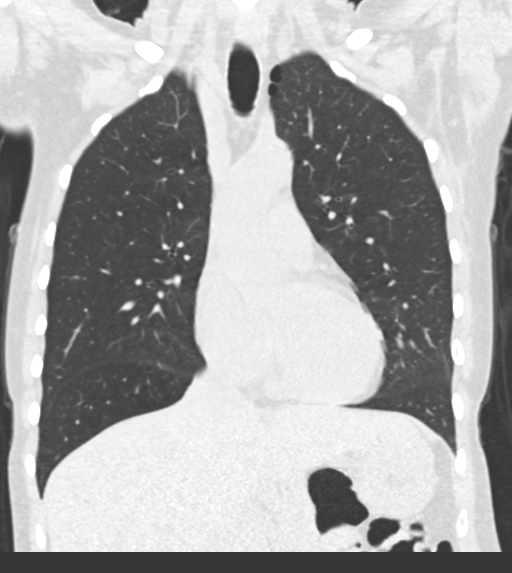
[im 71/118  lung]
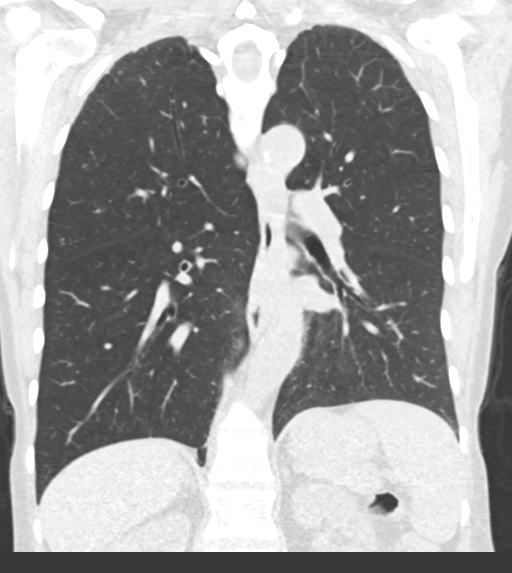

[15 of 40 positions shown; findings below may reference images not displayed]

FINDINGS: Cardiovascular: Atherosclerotic calcification of the arterial
vasculature, including coronary arteries. Heart size normal. No
pericardial effusion.

Mediastinum/Nodes: No pathologically enlarged mediastinal or
axillary lymph nodes. Hilar regions are difficult to evaluate
without IV contrast. Esophagus is grossly unremarkable.

Lungs/Pleura: Biapical pleuroparenchymal scarring. No worrisome
pulmonary nodules. Mild centrilobular emphysema. No pleural fluid.
Airway is unremarkable.

Upper Abdomen: Visualized portions of the liver and adrenal glands
are unremarkable. Hyperattenuating lesion off the upper pole right
kidney measures 10 mm, too small to characterize. Visualized
portions of the kidneys, spleen, pancreas, stomach and bowel are
otherwise grossly unremarkable.

Musculoskeletal: No worrisome lytic or sclerotic lesions.
IMPRESSION: 1. Lung-RADS Category 1, negative. Continue annual screening with
low-dose chest CT without contrast in 12 months.
2. Aortic atherosclerosis (RK2E7-170.0). Coronary artery
calcification.

## 2017-06-06 ENCOUNTER — Other Ambulatory Visit (HOSPITAL_COMMUNITY): Payer: Self-pay | Admitting: Psychiatry

## 2017-06-07 ENCOUNTER — Encounter: Payer: Self-pay | Admitting: Pediatrics

## 2017-06-07 ENCOUNTER — Ambulatory Visit (INDEPENDENT_AMBULATORY_CARE_PROVIDER_SITE_OTHER): Payer: Medicare Other | Admitting: Pediatrics

## 2017-06-07 VITALS — BP 70/50 | HR 70 | Temp 98.2°F | Resp 16

## 2017-06-07 DIAGNOSIS — F3130 Bipolar disorder, current episode depressed, mild or moderate severity, unspecified: Secondary | ICD-10-CM

## 2017-06-07 DIAGNOSIS — L508 Other urticaria: Secondary | ICD-10-CM

## 2017-06-07 DIAGNOSIS — G7000 Myasthenia gravis without (acute) exacerbation: Secondary | ICD-10-CM | POA: Diagnosis not present

## 2017-06-07 DIAGNOSIS — Z8739 Personal history of other diseases of the musculoskeletal system and connective tissue: Secondary | ICD-10-CM

## 2017-06-07 DIAGNOSIS — J410 Simple chronic bronchitis: Secondary | ICD-10-CM

## 2017-06-07 DIAGNOSIS — Z87891 Personal history of nicotine dependence: Secondary | ICD-10-CM | POA: Diagnosis not present

## 2017-06-07 DIAGNOSIS — T63481D Toxic effect of venom of other arthropod, accidental (unintentional), subsequent encounter: Secondary | ICD-10-CM

## 2017-06-07 DIAGNOSIS — F319 Bipolar disorder, unspecified: Secondary | ICD-10-CM

## 2017-06-07 DIAGNOSIS — J4530 Mild persistent asthma, uncomplicated: Secondary | ICD-10-CM | POA: Diagnosis not present

## 2017-06-07 NOTE — Telephone Encounter (Signed)
Medication Refill- received fax from [facility] requesting a 90 day supply for Remeron. Per Dr. De Nurse, refill request is denied.  Called and spoke with Junie Panning (pharmacist) at [facility]. Per Junie Panning, last refill pt received was on 04/22/17 for a 90 day supply of Remeron 7.5mg . Next refill is due on 06/30/17. Nothing further is need at this time.

## 2017-06-07 NOTE — Patient Instructions (Addendum)
Zyrtec 10 mg-take 1 tablet twice a day for itching Ranitidine 150 mg take 1 tablet twice a day  Montelukast  10 mg tablet-take 1 tablet once a day for coughing or wheezing Xopenex 2 puffs every 6 hours if needed for coughing or wheezing Add prednisone 10 mg twice a day for 4 days, 10 mg on the fifth day to bring your itching and cough under control We will go ahead and apply for Xolair because of your  uncontrolled chronic hives  If you have an insect sting, take Benadryl 50 mg every 4 hours and if you have life-threatening symptoms inject him with EpiPen 0.3 mg

## 2017-06-07 NOTE — Progress Notes (Signed)
[place] [address] Dept: [phone]  FOLLOW UP NOTE  Patient ID: Mandy Shaw, female    DOB: 1961-04-26  Age: 56 y.o. MRN: [id number] Date of Office Visit: 06/07/2017  Assessment  Chief Complaint: Asthma  HPI Mandy Shaw presents for follow-up of chronic urticaria. During the past she for 3 weeks she has been having more coughing productive of a clear mucus. She produces more mucus from rainy  weather She has a history of cigarette smoking for 30 years but she finally stopped. She continues to itch despite Zyrtec 10 mg twice a day, ranitidine 150 mg twice a day, montelukast 10 mg once a day. She also uses Xopenex 2 puffs every 6 hours if needed.  Her other medications are outlined in the chart   Drug Allergies:  Allergies  Allergen Reactions  . Albuterol Anaphylaxis  . Other Anaphylaxis    MANGO, PARSLEY.  [name] Seed Anaphylaxis  . Penicillins Anaphylaxis and Rash    Has patient had a PCN reaction causing immediate rash, facial/tongue/throat swelling, SOB or lightheadedness with hypotension: Yes Has patient had a PCN reaction causing severe rash involving mucus membranes or skin necrosis: Yes Has patient had a PCN reaction that required hospitalization No Has patient had a PCN reaction occurring within the last 10 years: No If all of the above answers are "NO", then may proceed with Cephalosporin use.   . Silica [Nutritional Supplements] Itching  . Sulfonamide Derivatives Anaphylaxis  . Linzess [Linaclotide] Other (See Comments)    dizziness  . Macrodantin [Nitrofurantoin Macrocrystal] Itching  . Neurontin [Gabapentin] Other (See Comments)    Unable to walk or talk   . Pregabalin Other (See Comments)    dizziness    Physical Exam: BP (!) 70/50   Pulse 70   Temp 98.2 F (36.8 C) (Oral)   Resp 16   LMP 06/20/2014   SpO2 98%    Physical Exam  Constitutional: She is oriented to person, place, and time. She appears well-developed and  well-nourished.  HENT:  Eyes normal. Ears normal. Nose normal. Pharynx normal.  Neck: Neck supple.  Cardiovascular:  S1 and S2 normal no murmurs  Pulmonary/Chest:  Clear to percussion and auscultation  Lymphadenopathy:    She has no cervical adenopathy.  Neurological: She is alert and oriented to person, place, and time.  Skin:  A few hives and dermographia noted  Psychiatric: She has a normal mood and affect. Her behavior is normal. Judgment and thought content normal.  Vitals reviewed.   Diagnostics:  FVC 4.23 L FEV1 3.17 L. Predicted FVC 3.53 L predicted FEV1 2.75 L-the spirometry is in the normal range  Assessment and Plan: 1. Mild persistent asthma without complication   2. Chronic urticaria   3. Simple chronic bronchitis ([place])   4. Myasthenia gravis ([place])   5. History of rheumatoid arthritis   6. Bipolar depression ([place])   7. History of prior cigarette smoking   8. Allergic reaction to insect sting, accidental or unintentional, subsequent encounter       Patient Instructions  Zyrtec 10 mg-take 1 tablet twice a day for itching Ranitidine 150 mg take 1 tablet twice a day  Montelukast  10 mg tablet-take 1 tablet once a day for coughing or wheezing Xopenex 2 puffs every 6 hours if needed for coughing or wheezing Add prednisone 10 mg twice a day for 4 days, 10 mg on the fifth day to bring your itching and cough under control We  will go ahead and apply for Xolair because of your  uncontrolled chronic hives  If you have an insect sting, take Benadryl 50 mg every 4 hours and if you have life-threatening symptoms inject him with EpiPen 0.3 mg   Return in about 4 weeks (around 07/05/2017).    Thank you for the opportunity to care for this patient.  Please do not hesitate to contact me with questions.  [name], M.D.  [facility] [address] [place], [place] [address] [phone]

## 2017-06-09 DIAGNOSIS — G894 Chronic pain syndrome: Secondary | ICD-10-CM | POA: Diagnosis not present

## 2017-06-09 DIAGNOSIS — M797 Fibromyalgia: Secondary | ICD-10-CM | POA: Diagnosis not present

## 2017-06-09 DIAGNOSIS — M545 Low back pain: Secondary | ICD-10-CM | POA: Diagnosis not present

## 2017-06-09 DIAGNOSIS — N189 Chronic kidney disease, unspecified: Secondary | ICD-10-CM | POA: Diagnosis not present

## 2017-06-09 DIAGNOSIS — M25561 Pain in right knee: Secondary | ICD-10-CM | POA: Diagnosis not present

## 2017-06-14 ENCOUNTER — Telehealth (HOSPITAL_COMMUNITY): Payer: Self-pay | Admitting: Psychiatry

## 2017-06-14 DIAGNOSIS — M25561 Pain in right knee: Secondary | ICD-10-CM | POA: Diagnosis not present

## 2017-06-14 DIAGNOSIS — G7000 Myasthenia gravis without (acute) exacerbation: Secondary | ICD-10-CM | POA: Diagnosis not present

## 2017-06-14 DIAGNOSIS — M797 Fibromyalgia: Secondary | ICD-10-CM | POA: Diagnosis not present

## 2017-06-14 DIAGNOSIS — M25562 Pain in left knee: Secondary | ICD-10-CM | POA: Diagnosis not present

## 2017-06-14 DIAGNOSIS — G8929 Other chronic pain: Secondary | ICD-10-CM | POA: Diagnosis not present

## 2017-06-14 NOTE — Telephone Encounter (Signed)
Can send 90 days. Caution about overdose risk and to keep limited amount available for daily use.

## 2017-06-14 NOTE — Telephone Encounter (Signed)
Pt needs 90 day supply of lexapro [facility].

## 2017-06-15 ENCOUNTER — Other Ambulatory Visit (HOSPITAL_COMMUNITY): Payer: Self-pay | Admitting: Psychiatry

## 2017-06-15 MED ORDER — ESCITALOPRAM OXALATE 20 MG PO TABS: 40.0000 mg | ORAL_TABLET | Freq: Every day | ORAL | 0 refills | Status: AC

## 2017-06-15 NOTE — Telephone Encounter (Signed)
Rx sent to pharmacy   

## 2017-06-15 NOTE — Telephone Encounter (Signed)
Medication refill- Received fax from [facility] requesting refills for Lamictal, Lexapro, and Trazodone. Per Dr. De Nurse, refills are authorize for Lamictal 200mg , #90, Lexapro 20mg , #180 and Trazodone 150mg , #90. Rx's were escribed to pharmacy. Pt next apt is schedule on 06/23/17. Lvm informing pt os refill status. Nothing further is need at this time.

## 2017-06-22 ENCOUNTER — Other Ambulatory Visit (HOSPITAL_COMMUNITY): Payer: Self-pay | Admitting: *Deleted

## 2017-06-22 MED ORDER — MIRTAZAPINE 7.5 MG PO TABS: 7.5000 mg | ORAL_TABLET | Freq: Every day | ORAL | 0 refills | Status: DC

## 2017-06-22 NOTE — Telephone Encounter (Signed)
Medication refill- pt call office requesting a refill for Remeron. Per Dr. De Nurse, refill is authorize for Remeron 7.5mg , #90. Rx was escribed to pharmacy. Pt has a f/u apt on 06/23/17. Called and informed pt of refill status. Pt verbalizes understanding.

## 2017-06-23 ENCOUNTER — Ambulatory Visit (HOSPITAL_COMMUNITY): Payer: Self-pay | Admitting: Psychiatry

## 2017-06-24 ENCOUNTER — Ambulatory Visit (INDEPENDENT_AMBULATORY_CARE_PROVIDER_SITE_OTHER): Payer: Medicare Other

## 2017-06-24 DIAGNOSIS — L500 Allergic urticaria: Secondary | ICD-10-CM | POA: Diagnosis not present

## 2017-06-24 MED ORDER — OMALIZUMAB 150 MG ~~LOC~~ SOLR
300.0000 mg | SUBCUTANEOUS | Status: AC
  Administered 2017-06-24 – 2019-07-31 (×20): 300 mg via SUBCUTANEOUS

## 2017-06-29 NOTE — Progress Notes (Signed)
Immunotherapy   Patient Details  Name: Mandy Shaw MRN: [id number] Date of Birth: 17-Jun-1961  06/29/2017  [name] started Xolair 300 mg Frequency: Q 4 weeks Epi-Pen: Epi-Pen available Consent signed and patient instructions given.   Estell Dillinger 06/29/2017, 11:59 AM

## 2017-07-12 ENCOUNTER — Ambulatory Visit: Payer: Medicare Other | Admitting: Pediatrics

## 2017-07-25 ENCOUNTER — Ambulatory Visit: Payer: Self-pay

## 2017-07-27 ENCOUNTER — Ambulatory Visit (INDEPENDENT_AMBULATORY_CARE_PROVIDER_SITE_OTHER): Payer: Medicare Other

## 2017-07-27 DIAGNOSIS — L500 Allergic urticaria: Secondary | ICD-10-CM | POA: Diagnosis not present

## 2017-08-14 ENCOUNTER — Other Ambulatory Visit: Payer: Self-pay | Admitting: Family Medicine

## 2017-08-15 ENCOUNTER — Other Ambulatory Visit: Payer: Self-pay | Admitting: *Deleted

## 2017-08-29 ENCOUNTER — Ambulatory Visit: Payer: Medicare Other | Admitting: Pediatrics

## 2017-08-29 ENCOUNTER — Ambulatory Visit: Payer: Self-pay

## 2017-09-06 ENCOUNTER — Ambulatory Visit (INDEPENDENT_AMBULATORY_CARE_PROVIDER_SITE_OTHER): Payer: Medicare Other | Admitting: Pediatrics

## 2017-09-06 ENCOUNTER — Ambulatory Visit (INDEPENDENT_AMBULATORY_CARE_PROVIDER_SITE_OTHER): Payer: Medicare Other

## 2017-09-06 VITALS — BP 110/70 | HR 76 | Temp 98.0°F | Resp 16

## 2017-09-06 VITALS — BP 90/60 | HR 82 | Temp 98.0°F | Resp 16

## 2017-09-06 DIAGNOSIS — M940 Chondrocostal junction syndrome [Tietze]: Secondary | ICD-10-CM

## 2017-09-06 DIAGNOSIS — L500 Allergic urticaria: Secondary | ICD-10-CM | POA: Diagnosis not present

## 2017-09-06 DIAGNOSIS — G7000 Myasthenia gravis without (acute) exacerbation: Secondary | ICD-10-CM | POA: Diagnosis not present

## 2017-09-06 DIAGNOSIS — F3130 Bipolar disorder, current episode depressed, mild or moderate severity, unspecified: Secondary | ICD-10-CM | POA: Diagnosis not present

## 2017-09-06 DIAGNOSIS — J4530 Mild persistent asthma, uncomplicated: Secondary | ICD-10-CM

## 2017-09-06 DIAGNOSIS — T63481D Toxic effect of venom of other arthropod, accidental (unintentional), subsequent encounter: Secondary | ICD-10-CM

## 2017-09-06 DIAGNOSIS — F319 Bipolar disorder, unspecified: Secondary | ICD-10-CM

## 2017-09-06 NOTE — Progress Notes (Signed)
[place] [address] Dept: [phone]  FOLLOW UP NOTE  Patient ID: Mandy Shaw, female    DOB: 29-Oct-1960  Age: 56 y.o. MRN: [id number] Date of Office Visit: 09/06/2017  Assessment  Chief Complaint: No chief complaint on file.  HPI Mandy Shaw presents for follow-up of chronic urticaria. She was started on Xolair once a month 2 months ago She feels that she has improved. Occasionally she has mild itching. She is on Zyrtec 10 mg twice a day, ranitidine 150 mg twice a day, montelukast 10 mg once a day During the past 6 weeks she has had chest pain which has not improved with the use of Xopenex 2 puffs every 6 hours. She has not had shortness of breath  Other current medications are outlined in the chart     Drug Allergies:  Allergies  Allergen Reactions  . Albuterol Anaphylaxis  . Bee Venom Itching  . Other Anaphylaxis    MANGO, PARSLEY.  [name] Seed Anaphylaxis  . Penicillins Anaphylaxis and Rash    Has patient had a PCN reaction causing immediate rash, facial/tongue/throat swelling, SOB or lightheadedness with hypotension: Yes Has patient had a PCN reaction causing severe rash involving mucus membranes or skin necrosis: Yes Has patient had a PCN reaction that required hospitalization No Has patient had a PCN reaction occurring within the last 10 years: No If all of the above answers are "NO", then may proceed with Cephalosporin use.   . Silica [Nutritional Supplements] Itching  . Sulfonamide Derivatives Anaphylaxis  . Linzess [Linaclotide] Other (See Comments)    dizziness  . Macrodantin [Nitrofurantoin Macrocrystal] Itching  . Neurontin [Gabapentin] Other (See Comments)    Unable to walk or talk   . Pregabalin Other (See Comments)    dizziness    Physical Exam: BP 110/70 (BP Location: Right Arm, Patient Position: Sitting, Cuff Size: Normal)   Pulse 76   Temp 98 F (36.7 C) (Oral)   Resp 16   LMP 06/20/2014    Physical Exam    Constitutional: She is oriented to person, place, and time. She appears well-developed and well-nourished.  HENT:  Eyes normal. Ears normal. Nose normal. Pharynx normal.  Neck: Neck supple.  Cardiovascular:  S1 and S2 normal no murmurs  Pulmonary/Chest:  Clear to percussion and auscultation . She had tenderness costochondritis at the bottom of the sternum  Lymphadenopathy:    She has no cervical adenopathy.  Neurological: She is alert and oriented to person, place, and time.  Psychiatric: She has a normal mood and affect. Her behavior is normal. Judgment and thought content normal.  Vitals reviewed.   Diagnostics:   FVC 3.98 L FEV1 2.83 L. Predicted FVC 3.53 L predicted FEV1 2.75 L-the spirometry is in the normal range  Assessment and Plan: 1. Costochondritis, acute   2. Mild persistent asthma without complication   3. Myasthenia gravis ([place])   4. Allergic reaction to insect sting, accidental or unintentional, subsequent encounter   5. Bipolar depression ([place])     No orders of the defined types were placed in this encounter.   Patient Instructions  Zyrtec 10 mg once a day for itching but you may use 10 mg twice a day if needed for itching Ranitidine 150 mg twice a day Montelukast  10 mg once a day  Xopenex 2 puffs every 6 hours if needed See your family doctor regarding the possible costochondritis. You mentioned that you cannot take Tylenol because of liver problems  or aspirin-like compounds because of kidney problems Continue Xolair once every month for your chronic hives Continue on your other medications   No Follow-up on file.    Thank you for the opportunity to care for this patient.  Please do not hesitate to contact me with questions.  [name], M.D.  [facility] [address] [place], [place] [address] [phone]

## 2017-09-07 DIAGNOSIS — M940 Chondrocostal junction syndrome [Tietze]: Secondary | ICD-10-CM | POA: Insufficient documentation

## 2017-09-07 NOTE — Patient Instructions (Addendum)
Zyrtec 10 mg once a day for itching but you may use 10 mg twice a day if needed for itching Ranitidine 150 mg twice a day Montelukast  10 mg once a day  Xopenex 2 puffs every 6 hours if needed See your family doctor regarding the possible costochondritis. You mentioned that you cannot take Tylenol because of liver problems or aspirin-like compounds because of kidney problems Continue Xolair once every month for your chronic hives Continue on your other medications

## 2017-09-08 NOTE — Addendum Note (Signed)
Addended by: Felipa Emory on: 09/08/2017 08:09 AM   Modules accepted: Orders

## 2017-10-11 ENCOUNTER — Ambulatory Visit: Payer: Self-pay

## 2017-10-12 ENCOUNTER — Ambulatory Visit: Payer: Self-pay

## 2017-10-13 ENCOUNTER — Ambulatory Visit (INDEPENDENT_AMBULATORY_CARE_PROVIDER_SITE_OTHER): Payer: Medicare HMO | Admitting: *Deleted

## 2017-10-13 DIAGNOSIS — L500 Allergic urticaria: Secondary | ICD-10-CM | POA: Diagnosis not present

## 2017-10-21 DIAGNOSIS — M7918 Myalgia, other site: Secondary | ICD-10-CM | POA: Diagnosis not present

## 2017-10-21 DIAGNOSIS — M47812 Spondylosis without myelopathy or radiculopathy, cervical region: Secondary | ICD-10-CM | POA: Diagnosis not present

## 2017-10-21 DIAGNOSIS — M47816 Spondylosis without myelopathy or radiculopathy, lumbar region: Secondary | ICD-10-CM | POA: Diagnosis not present

## 2017-10-21 DIAGNOSIS — G894 Chronic pain syndrome: Secondary | ICD-10-CM | POA: Diagnosis not present

## 2017-10-21 DIAGNOSIS — M797 Fibromyalgia: Secondary | ICD-10-CM | POA: Diagnosis not present

## 2017-11-10 ENCOUNTER — Ambulatory Visit: Payer: Self-pay

## 2017-11-11 DIAGNOSIS — R748 Abnormal levels of other serum enzymes: Secondary | ICD-10-CM | POA: Diagnosis not present

## 2017-11-11 DIAGNOSIS — E039 Hypothyroidism, unspecified: Secondary | ICD-10-CM | POA: Diagnosis not present

## 2017-11-11 DIAGNOSIS — N183 Chronic kidney disease, stage 3 (moderate): Secondary | ICD-10-CM | POA: Diagnosis not present

## 2017-11-16 DIAGNOSIS — M7918 Myalgia, other site: Secondary | ICD-10-CM | POA: Diagnosis not present

## 2017-11-16 DIAGNOSIS — G8929 Other chronic pain: Secondary | ICD-10-CM | POA: Diagnosis not present

## 2017-11-17 DIAGNOSIS — M545 Low back pain: Secondary | ICD-10-CM | POA: Diagnosis not present

## 2017-11-17 DIAGNOSIS — M47816 Spondylosis without myelopathy or radiculopathy, lumbar region: Secondary | ICD-10-CM | POA: Diagnosis not present

## 2017-11-29 ENCOUNTER — Ambulatory Visit (INDEPENDENT_AMBULATORY_CARE_PROVIDER_SITE_OTHER): Payer: Self-pay

## 2017-11-29 DIAGNOSIS — L501 Idiopathic urticaria: Secondary | ICD-10-CM

## 2017-12-06 ENCOUNTER — Ambulatory Visit: Payer: Medicare Other | Admitting: Pediatrics

## 2017-12-12 ENCOUNTER — Ambulatory Visit: Payer: Medicare Other | Admitting: Pediatrics

## 2017-12-19 ENCOUNTER — Encounter: Payer: Self-pay | Admitting: Podiatry

## 2017-12-19 ENCOUNTER — Ambulatory Visit: Payer: Medicare Other | Admitting: Podiatry

## 2017-12-19 DIAGNOSIS — L600 Ingrowing nail: Secondary | ICD-10-CM

## 2017-12-19 DIAGNOSIS — M79672 Pain in left foot: Secondary | ICD-10-CM

## 2017-12-19 DIAGNOSIS — M79671 Pain in right foot: Secondary | ICD-10-CM

## 2017-12-19 DIAGNOSIS — B351 Tinea unguium: Secondary | ICD-10-CM

## 2017-12-19 HISTORY — PX: OTHER SURGICAL HISTORY: SHX169

## 2017-12-19 NOTE — Patient Instructions (Signed)
Seen for hypertrophic ingrown nails. All nails debrided. Return as needed.

## 2017-12-19 NOTE — Progress Notes (Signed)
Subjective: 57 y.o. year old female patient presents complaining of painful nails. Left great toe nail has been very painful.  Just had a ablation procedure in her spine this morning. History of Carpal tunnel surgery on right 11/24/16 and recovering well. HPI: Need be seen q 6-8 weeks for painful calluses and ingrown nails.  Co existing condition: Myasthenia Gravis, Fibromyalgia.  Objective: Dermatologic: Painful ingrown nail left great toe medial border without infection. Vascular: Pedal pulses are all faintly palpable. Orthopedic: High arched cavus type foot with plantar flexed 5th metatarsal right. Neurologic: Subjective burning pain at dorsum of both feet. Rest pain while in bed or sitting in chair.  Assessment: Dystrophic mycotic nails x 10. Ingrown hallucal nail left great toe.  Treatment: All mycotic nails and ingrown nails debrided.  Return as needed.

## 2017-12-22 ENCOUNTER — Other Ambulatory Visit: Payer: Self-pay | Admitting: Family Medicine

## 2017-12-26 ENCOUNTER — Ambulatory Visit: Payer: Medicare Other | Admitting: Pediatrics

## 2017-12-26 ENCOUNTER — Encounter: Payer: Self-pay | Admitting: Pediatrics

## 2017-12-26 ENCOUNTER — Ambulatory Visit (INDEPENDENT_AMBULATORY_CARE_PROVIDER_SITE_OTHER): Payer: Medicare Other

## 2017-12-26 VITALS — BP 98/58 | HR 95 | Temp 98.0°F | Resp 16

## 2017-12-26 DIAGNOSIS — K219 Gastro-esophageal reflux disease without esophagitis: Secondary | ICD-10-CM | POA: Diagnosis not present

## 2017-12-26 DIAGNOSIS — J4530 Mild persistent asthma, uncomplicated: Secondary | ICD-10-CM

## 2017-12-26 DIAGNOSIS — Z8739 Personal history of other diseases of the musculoskeletal system and connective tissue: Secondary | ICD-10-CM

## 2017-12-26 DIAGNOSIS — L508 Other urticaria: Secondary | ICD-10-CM

## 2017-12-26 DIAGNOSIS — J309 Allergic rhinitis, unspecified: Secondary | ICD-10-CM | POA: Insufficient documentation

## 2017-12-26 DIAGNOSIS — L500 Allergic urticaria: Secondary | ICD-10-CM | POA: Diagnosis not present

## 2017-12-26 MED ORDER — MONTELUKAST SODIUM 10 MG PO TABS: 10.0000 mg | ORAL_TABLET | Freq: Every day | ORAL | 3 refills | Status: DC

## 2017-12-26 MED ORDER — RANITIDINE HCL 150 MG PO TABS: ORAL_TABLET | ORAL | 3 refills | Status: DC

## 2017-12-26 NOTE — Progress Notes (Addendum)
[place] [address] Dept: [phone]  FOLLOW UP NOTE  Patient ID: Mandy Shaw, female    DOB: 04-Jun-1961  Age: 57 y.o. MRN: [id number] Date of Office Visit: 12/26/2017  Assessment  Chief Complaint: Urticaria and Asthma  HPI Mandy Shaw is a 57 year old female who presents to the clinic for a follow up visit. She was last seen in this clinic on 06/07/2017 by Dr. Shaune Leeks for evaluation of asthma, chronic urticaria, stinging insect allergy, chronic bronchitis, history of rheumatoid arthritis, bipolar depression, and cigaretts smoking. At that time, she continues Zyrtec, ranitidine 150 mg twice a day, montelukast 10 mg once a day, and she started Xolair injections once a month.  At today's visit, she reports that her chronic urticaria is well controlled, however, she reports that her hives flare in the heat. She continues to take cetirizine 10 mg once a day, ranitidine 150 mg twice a day, montelukast 10 mg once a day, and Xolair 300 mg injection every 4 weeks.   Asthma is reported as moderately well controlled. She reports 2 episodes of asthma exacerbations each lasting 4-5 minutes and consisting of symptoms including shortness of breath, chest tightness, chest pain, and some wheezing for which she used her Xopenex rescue inhaler with relief. Otherwise, she reports shortness of breath with activity with no cough or wheeze. She is currently using montelukast 10 mg once a day and using Xopenex inhaler 1 or more times a week. She reports snoring at night and gasping for air while sleeping.   Allergic rhinitis is reported as well controlled. She is not currently using any nasal sprays. She reports frequent throat clearing that is dry with no sputum production.   GERD is reported as not well controlled despite taking Protonix 40 mg once a day, Pepcid 20 mg once a day, and ranitidine 150 mg once a day. She reports her heartburn flares occasionally and is not aggravated by any  foods in particular.   Her medications are listed in the chart.    Drug Allergies:  Allergies  Allergen Reactions  . Albuterol Anaphylaxis  . Bee Venom Itching  . Other Anaphylaxis    MANGO, PARSLEY.  [name] Seed Anaphylaxis  . Penicillins Anaphylaxis and Rash    Has patient had a PCN reaction causing immediate rash, facial/tongue/throat swelling, SOB or lightheadedness with hypotension: Yes Has patient had a PCN reaction causing severe rash involving mucus membranes or skin necrosis: Yes Has patient had a PCN reaction that required hospitalization No Has patient had a PCN reaction occurring within the last 10 years: No If all of the above answers are "NO", then may proceed with Cephalosporin use.   . Silica [Nutritional Supplements] Itching  . Sulfonamide Derivatives Anaphylaxis  . Elavil [Amitriptyline Hcl]     Eyes out of focus, dry eye  . Linzess [Linaclotide] Other (See Comments)    dizziness  . Macrodantin [Nitrofurantoin Macrocrystal] Itching  . Neurontin [Gabapentin] Other (See Comments)    Unable to walk or talk   . Pregabalin Other (See Comments)    dizziness    Physical Exam: BP (!) 98/58   Pulse 95   Temp 98 F (36.7 C) (Oral)   Resp 16   LMP 06/20/2014   SpO2 97%    Physical Exam  Constitutional: She is oriented to person, place, and time. She appears well-developed and well-nourished.  HENT:  Head: Normocephalic and atraumatic.  Right Ear: External ear normal.  Left Ear: External  ear normal.  Nose: Nose normal.  Pharynx slightly erythematous with no exudate noted.   Eyes: Conjunctivae are normal.  Neck: Normal range of motion. Neck supple.  Cardiovascular: Normal rate, regular rhythm and normal heart sounds.  No murmur noted  Pulmonary/Chest: Effort normal and breath sounds normal.  Lungs clear to auscultation  Musculoskeletal: Normal range of motion.  Neurological: She is alert and oriented to person, place, and time.  Skin: Skin is warm and  dry.  Psychiatric: She has a normal mood and affect. Her behavior is normal. Judgment and thought content normal.    Diagnostics: FVC 4.37, FEV1 3.08. Predicted FVC 3.50, predicted FEV1 2.73. Spirometry reveals normal ventilatory function.   Assessment and Plan: 1. Mild persistent asthma without complication   2. Chronic urticaria   3. Gastroesophageal reflux disease without esophagitis   4. History of rheumatoid arthritis     Meds ordered this encounter  Medications  . montelukast (SINGULAIR) 10 MG tablet    Sig: Take 1 tablet (10 mg total) by mouth at bedtime.    Dispense:  90 tablet    Refill:  3  . ranitidine (ZANTAC) 150 MG tablet    Sig: One tablet twice a day for heartburn.  May help with hives.    Dispense:  180 tablet    Refill:  3    Dispense 90 days supply.    Patient Instructions  Zyrtec 10 mg once a day for itching but you may use 10 mg twice a day if needed for itching Ranitidine 150 mg twice a day Montelukast  10 mg once a day to prevent cough and wheeze Xopenex 2 puffs every 6 hours if needed for cough, wheeze, or shortness of breath. You may use this 5-15 minutes before exercise.  Continue Xolair once every month for your chronic hives We suspect you may have sleep apnea due to snoring and gasping for air while sleeping. Please check in with Dr. Martina Sinner regarding a possible sleep study Continue on your other medications Call me if this treatment plan is not working for you  Follow up in 6 months or sooner if needed   Return in about 6 months (around 06/27/2018), or if symptoms worsen or fail to improve.   Thank you for the opportunity to care for this patient.  Please do not hesitate to contact me with questions.  [name], FNP [facility]  I have provided oversight concerning [name]' evaluation and treatment of this patient's health issues addressed during today's encounter. I agree with the  assessment and therapeutic plan as outlined in the note.   Thank you for the opportunity to care for this patient.  Please do not hesitate to contact me with questions.  [name], M.D.  [facility] [address] [place], [place] [address] [phone]

## 2017-12-26 NOTE — Patient Instructions (Addendum)
Zyrtec 10 mg once a day for itching but you may use 10 mg twice a day if needed for itching Ranitidine 150 mg twice a day Montelukast  10 mg once a day to prevent cough and wheeze Xopenex 2 puffs every 6 hours if needed for cough, wheeze, or shortness of breath. You may use this 5-15 minutes before exercise.  Continue Xolair once every month for your chronic hives We suspect you may have sleep apnea due to snoring and gasping for air while sleeping. Please check in with Dr. Martina Sinner regarding a possible sleep study Continue on your other medications Call me if this treatment plan is not working for you  Follow up in 6 months or sooner if needed

## 2017-12-27 ENCOUNTER — Ambulatory Visit: Payer: Self-pay

## 2018-01-11 ENCOUNTER — Ambulatory Visit: Payer: Medicare Other

## 2018-01-11 ENCOUNTER — Other Ambulatory Visit: Payer: Self-pay | Admitting: Family Medicine

## 2018-01-11 DIAGNOSIS — Z87891 Personal history of nicotine dependence: Secondary | ICD-10-CM

## 2018-01-20 ENCOUNTER — Other Ambulatory Visit: Payer: Self-pay | Admitting: Acute Care

## 2018-01-20 DIAGNOSIS — Z122 Encounter for screening for malignant neoplasm of respiratory organs: Secondary | ICD-10-CM

## 2018-01-20 DIAGNOSIS — Z87891 Personal history of nicotine dependence: Secondary | ICD-10-CM

## 2018-01-24 ENCOUNTER — Ambulatory Visit: Payer: Self-pay

## 2018-03-16 ENCOUNTER — Ambulatory Visit (INDEPENDENT_AMBULATORY_CARE_PROVIDER_SITE_OTHER): Payer: Medicare Other

## 2018-03-16 DIAGNOSIS — L500 Allergic urticaria: Secondary | ICD-10-CM

## 2018-04-04 ENCOUNTER — Ambulatory Visit: Payer: Medicare Other | Admitting: Pediatrics

## 2018-04-10 ENCOUNTER — Ambulatory Visit: Payer: Medicare Other

## 2018-04-10 ENCOUNTER — Ambulatory Visit: Payer: Medicare Other | Admitting: Family Medicine

## 2018-04-10 ENCOUNTER — Encounter: Payer: Self-pay | Admitting: Family Medicine

## 2018-04-10 VITALS — BP 90/58 | HR 68 | Temp 98.4°F | Resp 16

## 2018-04-10 DIAGNOSIS — L500 Allergic urticaria: Secondary | ICD-10-CM

## 2018-04-10 DIAGNOSIS — J4530 Mild persistent asthma, uncomplicated: Secondary | ICD-10-CM | POA: Diagnosis not present

## 2018-04-10 DIAGNOSIS — K219 Gastro-esophageal reflux disease without esophagitis: Secondary | ICD-10-CM

## 2018-04-10 DIAGNOSIS — L508 Other urticaria: Secondary | ICD-10-CM | POA: Diagnosis not present

## 2018-04-10 DIAGNOSIS — Z8739 Personal history of other diseases of the musculoskeletal system and connective tissue: Secondary | ICD-10-CM

## 2018-04-10 NOTE — Progress Notes (Signed)
[place] [address] Dept: [phone]  FOLLOW UP NOTE  Patient ID: Mandy Shaw, female    DOB: [birth year]  Age: 57 y.o. MRN: [id number] Date of Office Visit: 04/10/2018  Assessment  Chief Complaint: Asthma  HPI Mandy Shaw is a 57 year old female who presents to the clinic for a follow up visit. She was last seen in this clinic on 12/26/2017 by Dr. Shaune Leeks for evaluation of asthma, chronic urticaria, allergic rhinitis, and stinging insect allergy. At that time, her chronic urticaria was well controlled with Xolair, cetirizine, montelukast, and ranitidine. Asthma was reported as moderately well controlled with montelukast and Xopenex as needed.   At today's visit, she reports her chronic urticaria remains well controlled with Xolair injections once a month. She reports a significant decrease in her symptoms since beginning Xolair injections. She denies any breakthrough hives or itching since her last visit to this office. She is not currently taking ranitidine.   Asthma is reported as moderately well controlled with occasional shortness of breath with activity. She denies shortness of breath at rest. She denies wheeze or cough with activity and rest. She is currently talking montelukast 10 mg once a day and uses Xopenex about 2 times a week with relief of symptoms.   Her rhinitis is reported as well controlled with both cetirizine and Aller-Tec once a day and Flonase as needed. She reports that she was prescribed Flonase when she last visited her primary care provider, Dr. Martina Sinner.   Her current medications are listed in the chart.   Drug Allergies:  Allergies  Allergen Reactions  . Albuterol Anaphylaxis  . Bee Venom Itching  . Other Anaphylaxis    MANGO, PARSLEY.  [name] Seed Anaphylaxis  . Penicillins Anaphylaxis and Rash    Has patient had a PCN reaction causing immediate rash, facial/tongue/throat swelling, SOB or lightheadedness with hypotension: Yes Has  patient had a PCN reaction causing severe rash involving mucus membranes or skin necrosis: Yes Has patient had a PCN reaction that required hospitalization No Has patient had a PCN reaction occurring within the last 10 years: No If all of the above answers are "NO", then may proceed with Cephalosporin use.   . Silica [Nutritional Supplements] Itching  . Sulfonamide Derivatives Anaphylaxis  . Elavil [Amitriptyline Hcl]     Eyes out of focus, dry eye  . Linzess [Linaclotide] Other (See Comments)    dizziness  . Macrodantin [Nitrofurantoin Macrocrystal] Itching  . Neurontin [Gabapentin] Other (See Comments)    Unable to walk or talk   . Pregabalin Other (See Comments)    dizziness    Physical Exam: BP (!) 90/58   Pulse 68   Temp 98.4 F (36.9 C) (Oral)   Resp 16   LMP 06/20/2014   SpO2 98%    Physical Exam  Constitutional: She is oriented to person, place, and time. She appears well-developed and well-nourished.  HENT:  Head: Normocephalic.  Right Ear: External ear normal.  Left Ear: External ear normal.  Nose: Nose normal.  Mouth/Throat: Oropharynx is clear and moist.  Eyes: Conjunctivae are normal.  Neck: Normal range of motion. Neck supple.  Cardiovascular: Normal rate, regular rhythm and normal heart sounds.  No murmur noted  Pulmonary/Chest: Effort normal and breath sounds normal.  Lungs clear to auscultation  Musculoskeletal: Normal range of motion.  Neurological: She is alert and oriented to person, place, and time.  Skin: Skin is warm and dry.  Psychiatric: She has a  normal mood and affect. Her behavior is normal. Judgment and thought content normal.  Vitals reviewed.   Diagnostics: FVC 3.92, FEV1 3.02. Predicted FVC 3.50, predicted FEV1 2.73. Spirometry is within the normal range.   Assessment and Plan: 1. Mild persistent asthma without complication   2. Chronic urticaria   3. Gastroesophageal reflux disease without esophagitis   4. History of rheumatoid  arthritis     No orders of the defined types were placed in this encounter.   Patient Instructions  Continue Zyrtec 10 mg once a day for itching or runny nose. Continue Aller-tec (loratadine) once a day as needed for itching or a runny nose Begin Ranitidine 150 mg twice a day for breakthrough itching Montelukast  10 mg once a day to prevent cough and wheeze Xopenex 2 puffs every 6 hours if needed for cough, wheeze, or shortness of breath. You may use this 5-15 minutes before exercise.  Continue Xolair once every month for your chronic hives Avoid stinging insects. In case of an allergic reaction, give Benadryl 50 mg every 4 hours, and if life-threatening symptoms occur, inject with EpiPen    Continue on your other medications  Call me if this treatment plan is not working for you  Follow up in 6 months or sooner if needed mg   Return in about 6 months (around 10/11/2018), or if symptoms worsen or fail to improve.   Thank you for the opportunity to care for this patient.  Please do not hesitate to contact me with questions.  [name], FNP [facility]  I have provided oversight concerning [name]' evaluation and treatment of this patient's health issues addressed during today's encounter. I agree with the assessment and therapeutic plan as outlined in the note.   Thank you for the opportunity to care for this patient.  Please do not hesitate to contact me with questions.  [name], M.D.  [facility] [address] [place], [place] [address] [phone]

## 2018-04-10 NOTE — Patient Instructions (Addendum)
Continue Zyrtec 10 mg once a day for itching or runny nose. Continue Aller-tec (loratadine) once a day as needed for itching or a runny nose Begin Ranitidine 150 mg twice a day for breakthrough itching Montelukast  10 mg once a day to prevent cough and wheeze Xopenex 2 puffs every 6 hours if needed for cough, wheeze, or shortness of breath. You may use this 5-15 minutes before exercise.  Continue Xolair once every month for your chronic hives Avoid stinging insects. In case of an allergic reaction, give Benadryl 50 mg every 4 hours, and if life-threatening symptoms occur, inject with EpiPen    Continue on your other medications  Call me if this treatment plan is not working for you  Follow up in 6 months or sooner if needed mg

## 2018-05-10 ENCOUNTER — Ambulatory Visit (INDEPENDENT_AMBULATORY_CARE_PROVIDER_SITE_OTHER): Payer: Medicare Other | Admitting: *Deleted

## 2018-05-10 DIAGNOSIS — J4540 Moderate persistent asthma, uncomplicated: Secondary | ICD-10-CM

## 2018-06-08 ENCOUNTER — Ambulatory Visit (INDEPENDENT_AMBULATORY_CARE_PROVIDER_SITE_OTHER): Payer: Medicare Other

## 2018-06-08 DIAGNOSIS — L500 Allergic urticaria: Secondary | ICD-10-CM | POA: Diagnosis not present

## 2018-06-19 ENCOUNTER — Other Ambulatory Visit: Payer: Self-pay | Admitting: Allergy

## 2018-06-26 ENCOUNTER — Other Ambulatory Visit: Payer: Self-pay | Admitting: *Deleted

## 2018-06-26 MED ORDER — OMALIZUMAB 150 MG ~~LOC~~ SOLR: 300.0000 mg | SUBCUTANEOUS | 11 refills | Status: AC

## 2018-07-05 ENCOUNTER — Ambulatory Visit: Payer: Self-pay

## 2018-07-11 ENCOUNTER — Ambulatory Visit: Payer: Self-pay

## 2018-07-20 ENCOUNTER — Ambulatory Visit: Payer: Medicare Other

## 2018-07-20 DIAGNOSIS — L500 Allergic urticaria: Secondary | ICD-10-CM

## 2018-08-15 ENCOUNTER — Ambulatory Visit (INDEPENDENT_AMBULATORY_CARE_PROVIDER_SITE_OTHER): Payer: Medicare Other

## 2018-08-15 DIAGNOSIS — L500 Allergic urticaria: Secondary | ICD-10-CM | POA: Diagnosis not present

## 2018-09-11 ENCOUNTER — Ambulatory Visit (INDEPENDENT_AMBULATORY_CARE_PROVIDER_SITE_OTHER): Payer: Medicare Other

## 2018-09-11 DIAGNOSIS — J4540 Moderate persistent asthma, uncomplicated: Secondary | ICD-10-CM

## 2018-09-18 ENCOUNTER — Ambulatory Visit: Payer: Self-pay

## 2018-10-03 ENCOUNTER — Other Ambulatory Visit: Payer: Self-pay | Admitting: Family Medicine

## 2018-10-03 ENCOUNTER — Telehealth: Payer: Self-pay

## 2018-10-03 NOTE — Telephone Encounter (Signed)
Sent in courtesy refill for montelukast 10mg  tablet. Pt has a fuv on 10/23/2018.

## 2018-10-09 ENCOUNTER — Ambulatory Visit: Payer: Self-pay

## 2018-10-18 ENCOUNTER — Ambulatory Visit (INDEPENDENT_AMBULATORY_CARE_PROVIDER_SITE_OTHER): Payer: Medicare Other | Admitting: *Deleted

## 2018-10-18 DIAGNOSIS — J4540 Moderate persistent asthma, uncomplicated: Secondary | ICD-10-CM

## 2018-10-19 ENCOUNTER — Ambulatory Visit: Payer: Medicare Other | Admitting: Family Medicine

## 2018-10-23 ENCOUNTER — Ambulatory Visit: Payer: Medicare Other | Admitting: Family Medicine

## 2018-10-30 ENCOUNTER — Telehealth: Payer: Self-pay | Admitting: Family Medicine

## 2018-10-30 ENCOUNTER — Ambulatory Visit: Payer: Medicare Other | Admitting: Family Medicine

## 2018-10-30 ENCOUNTER — Encounter: Payer: Self-pay | Admitting: Family Medicine

## 2018-10-30 ENCOUNTER — Other Ambulatory Visit: Payer: Self-pay

## 2018-10-30 VITALS — BP 94/58 | HR 74 | Temp 98.6°F | Resp 20 | Ht 65.0 in

## 2018-10-30 DIAGNOSIS — T63481D Toxic effect of venom of other arthropod, accidental (unintentional), subsequent encounter: Secondary | ICD-10-CM

## 2018-10-30 DIAGNOSIS — J4540 Moderate persistent asthma, uncomplicated: Secondary | ICD-10-CM | POA: Insufficient documentation

## 2018-10-30 DIAGNOSIS — G7000 Myasthenia gravis without (acute) exacerbation: Secondary | ICD-10-CM | POA: Diagnosis not present

## 2018-10-30 DIAGNOSIS — L500 Allergic urticaria: Secondary | ICD-10-CM | POA: Diagnosis not present

## 2018-10-30 DIAGNOSIS — J3089 Other allergic rhinitis: Secondary | ICD-10-CM | POA: Diagnosis not present

## 2018-10-30 DIAGNOSIS — K219 Gastro-esophageal reflux disease without esophagitis: Secondary | ICD-10-CM

## 2018-10-30 MED ORDER — CETIRIZINE HCL 10 MG PO TABS: 10.0000 mg | ORAL_TABLET | Freq: Every day | ORAL | 5 refills | Status: DC

## 2018-10-30 MED ORDER — LEVALBUTEROL TARTRATE 45 MCG/ACT IN AERO: 2.0000 | INHALATION_SPRAY | Freq: Four times a day (QID) | RESPIRATORY_TRACT | 0 refills | Status: DC | PRN

## 2018-10-30 NOTE — Progress Notes (Signed)
[address] [place] [address] Dept: [phone]  FOLLOW UP NOTE  Patient ID: Mandy Shaw, female    DOB: February 18, 1961  Age: 58 y.o. MRN: [id number] Date of Office Visit: 10/30/2018  Assessment  Chief Complaint: Asthma  HPI Mandy Shaw is a 58 year old female who presents to the clinic for a follow up visit. She reports she has had intermittent breakouts which are occurring on her hands and legs for the last 6 weeks. There are no concomitant cardiopulmonary or gastrointestinal symptoms associated with the rash. She continues Xolair 300 mg once every 28 days, montelukast 10 mg once a day, and cetirizine once a day.  She reports her asthma is well controlled with Xopenex before activities.  She denies shortness of breath, wheezing, and cough.  She does report shortness of breath with vigorous activity.  Allergic rhinitis is reported as moderately well controlled with runny nose with clear drainage, postnasal drainage, and frequent throat clearing.  She continues loratadine.  Reflux is reported as well controlled with omeprazole.  Her current medications are listed in the chart.   Drug Allergies:  Allergies  Allergen Reactions  . Albuterol Anaphylaxis  . Bee Venom Itching  . Other Anaphylaxis    MANGO, PARSLEY. Nasal canula causes eye swelling and itchy eye.   [name] Seed Anaphylaxis  . Penicillins Anaphylaxis and Rash    Has patient had a PCN reaction causing immediate rash, facial/tongue/throat swelling, SOB or lightheadedness with hypotension: Yes Has patient had a PCN reaction causing severe rash involving mucus membranes or skin necrosis: Yes Has patient had a PCN reaction that required hospitalization No Has patient had a PCN reaction occurring within the last 10 years: No If all of the above answers are "NO", then may proceed with Cephalosporin use.   . Silica [Nutritional Supplements] Itching  . Sulfonamide Derivatives Anaphylaxis  . Elavil [Amitriptyline Hcl]    Eyes out of focus, dry eye  . Linzess [Linaclotide] Other (See Comments)    dizziness  . Macrodantin [Nitrofurantoin Macrocrystal] Itching  . Neurontin [Gabapentin] Other (See Comments)    Unable to walk or talk   . Pregabalin Other (See Comments)    dizziness    Physical Exam: BP (!) 94/58   Pulse 74   Temp 98.6 F (37 C) (Oral)   Resp 20   Ht 5\' 5"  (1.651 m)   LMP 06/20/2014   SpO2 100%   BMI 22.03 kg/m    Physical Exam Vitals signs reviewed.  Constitutional:      Appearance: Normal appearance.  HENT:     Head: Normocephalic and atraumatic.     Nose:     Comments: Bilateral nares slightly erythematous with clear nasal drainage noted.  Pharynx slightly erythematous with no exudate.  Ears normal.  Eyes normal. Eyes:     Conjunctiva/sclera: Conjunctivae normal.  Neck:     Musculoskeletal: Normal range of motion and neck supple.  Cardiovascular:     Rate and Rhythm: Normal rate and regular rhythm.     Heart sounds: Normal heart sounds.  Pulmonary:     Effort: Pulmonary effort is normal.     Breath sounds: Normal breath sounds.     Comments: Lungs clear to auscultation Musculoskeletal: Normal range of motion.  Skin:    General: Skin is warm and dry.     Comments: Scattered macular erythematous areas noted on bilateral hands, bilateral arms, and bilateral legs.  Neurological:     Mental Status: She is  alert and oriented to person, place, and time.  Psychiatric:        Mood and Affect: Mood normal.        Behavior: Behavior normal.        Thought Content: Thought content normal.        Judgment: Judgment normal.     Diagnostics: FVC 4.00, FEV1 2.83.  Predicted FVC 3.50, predicted FEV1 2.73.  Spirometry is within the normal range.  Assessment and Plan: 1. Allergic urticaria   2. Moderate persistent asthma without complication   3. Allergic rhinitis due to other allergic trigger, unspecified seasonality   4. Myasthenia gravis ([place])   5. Gastroesophageal reflux  disease without esophagitis   6. Allergic reaction to insect sting, accidental or unintentional, subsequent encounter     Meds ordered this encounter  Medications  . DISCONTD: cetirizine (ZYRTEC) 10 MG tablet    Sig: Take 1 tablet (10 mg total) by mouth daily.    Dispense:  30 tablet    Refill:  5    Patient Instructions  Continue Aller-tec (loratadine) once a day as needed for itching or a runny nose. Begin famotidine 20 mg twice a day for breakthrough itching Continue Montelukast  10 mg once a day to prevent cough and wheeze Continue Xolair injections once every month for your chronic hives Xopenex 2 puffs every 6 hours if needed for cough, wheeze, or shortness of breath.  You may use this 5-15 minutes before exercise.  Begin azelastine 0.1%- 2 sprays in each nostril twice a day as needed for a runny nose Continue Flonase 2 sprays in each nostril once a day as needed for a stuffy nose Avoid stinging insects. In case of an allergic reaction, give Benadryl 50 mg every 4 hours, and if life-threatening symptoms occur, inject with EpiPen    Continue on your other medications  Call me if this treatment plan is not working for you  Follow up in 6 months or sooner if needed mg   Return in about 6 months (around 04/30/2019), or if symptoms worsen or fail to improve.   Thank you for the opportunity to care for this patient.  Please do not hesitate to contact me with questions.  [name], FNP [facility]  I have provided oversight concerning [name]' evaluation and treatment of this patient's health issues addressed during today's encounter. I agree with the assessment and therapeutic plan as outlined in the note.   Thank you for the opportunity to care for this patient.  Please do not hesitate to contact me with questions.  [name], M.D.  [facility] [address] [place],  [place] [address] [phone]

## 2018-10-30 NOTE — Telephone Encounter (Signed)
Answers pts questions on the xopenex and famotidine pt stated the azelastine nasal spray was way to expensive and was wondering if we could try sending in something else.

## 2018-10-30 NOTE — Patient Instructions (Addendum)
Continue Aller-tec (loratadine) once a day as needed for itching or a runny nose. Begin famotidine 20 mg twice a day for breakthrough itching Continue Montelukast  10 mg once a day to prevent cough and wheeze Continue Xolair injections once every month for your chronic hives Xopenex 2 puffs every 6 hours if needed for cough, wheeze, or shortness of breath.  You may use this 5-15 minutes before exercise.  Begin azelastine 0.1%- 2 sprays in each nostril twice a day as needed for a runny nose Continue Flonase 2 sprays in each nostril once a day as needed for a stuffy nose Avoid stinging insects. In case of an allergic reaction, give Benadryl 50 mg every 4 hours, and if life-threatening symptoms occur, inject with EpiPen    Continue on your other medications  Call me if this treatment plan is not working for you  Follow up in 6 months or sooner if needed mg

## 2018-10-30 NOTE — Telephone Encounter (Signed)
She would like a call back regarding the medication she prescribed this morning because they are not on the formulary and they are very expensive.  She also has a question about why the medication was changed.  Please call her back at phone number on account.  Thank you! Andee Poles

## 2018-11-01 ENCOUNTER — Other Ambulatory Visit: Payer: Self-pay

## 2018-11-01 MED ORDER — OLOPATADINE HCL 0.6 % NA SOLN: 1.0000 | Freq: Two times a day (BID) | NASAL | 5 refills | Status: AC

## 2018-11-01 NOTE — Telephone Encounter (Signed)
Sent in rx and informed pt 

## 2018-11-01 NOTE — Telephone Encounter (Signed)
We can try olopatadine nasal 0.6% nasal spray twice a day or she can go back to what she had been using before her visit. Thank you

## 2018-11-02 ENCOUNTER — Telehealth: Payer: Self-pay | Admitting: Family Medicine

## 2018-11-02 NOTE — Telephone Encounter (Signed)
She had an allergic reaction to albuterol in the past. Is there a patient assistance she can apply for for Xopenex? Or levalbuteol?

## 2018-11-02 NOTE — Telephone Encounter (Signed)
Spoke with pt she said it would be $100 for a month supply of xopenex inhaler and wants to know if we can find something else more feesable for her.

## 2018-11-02 NOTE — Telephone Encounter (Signed)
PT called to say that prior authorization is needed for generic and/or xopenex inhaler. Ordered on 10/30/2018 and also is $100/month for 30 day supply. She cannot afford that price.

## 2018-11-02 NOTE — Telephone Encounter (Signed)
I will look into the patient assistance

## 2018-11-06 ENCOUNTER — Other Ambulatory Visit: Payer: Self-pay

## 2018-11-06 ENCOUNTER — Telehealth: Payer: Self-pay | Admitting: Family Medicine

## 2018-11-06 MED ORDER — FLUNISOLIDE 25 MCG/ACT (0.025%) NA SOLN: 2.0000 | Freq: Two times a day (BID) | NASAL | 5 refills | Status: AC

## 2018-11-06 NOTE — Telephone Encounter (Signed)
That change would be appropriate. Flunisolide 0.025% 2 sprays in each nostril twice a day as needed for allergic rhinitis. Thank you

## 2018-11-06 NOTE — Telephone Encounter (Signed)
I can not find a patient assistance program

## 2018-11-06 NOTE — Telephone Encounter (Signed)
Is it okay to change?

## 2018-11-06 NOTE — Telephone Encounter (Signed)
PT called to say olotatadine is not covered by insurance ("not on your formulary"), but flunisolide is covered

## 2018-11-06 NOTE — Telephone Encounter (Signed)
Change rx to flunisolide

## 2018-11-08 NOTE — Telephone Encounter (Signed)
Next time she comes in can we please give her a good rx card. It may provide a discount for her. Thank you

## 2018-11-08 NOTE — Telephone Encounter (Signed)
noted 

## 2018-11-15 ENCOUNTER — Ambulatory Visit: Payer: Self-pay

## 2018-11-20 ENCOUNTER — Ambulatory Visit (INDEPENDENT_AMBULATORY_CARE_PROVIDER_SITE_OTHER): Payer: Medicare Other | Admitting: *Deleted

## 2018-11-20 DIAGNOSIS — L500 Allergic urticaria: Secondary | ICD-10-CM

## 2018-12-18 ENCOUNTER — Ambulatory Visit (INDEPENDENT_AMBULATORY_CARE_PROVIDER_SITE_OTHER): Payer: Medicare Other

## 2018-12-18 ENCOUNTER — Other Ambulatory Visit: Payer: Self-pay

## 2018-12-18 DIAGNOSIS — L500 Allergic urticaria: Secondary | ICD-10-CM | POA: Diagnosis not present

## 2019-01-12 ENCOUNTER — Other Ambulatory Visit: Payer: Self-pay | Admitting: *Deleted

## 2019-01-12 MED ORDER — FAMOTIDINE 20 MG PO TABS: 20.0000 mg | ORAL_TABLET | Freq: Two times a day (BID) | ORAL | 0 refills | Status: DC

## 2019-01-12 NOTE — Telephone Encounter (Signed)
Pt requesting refill on protonix. Only see mention of famotidine in doctors note. Pt agreed for ur to send pepcid.

## 2019-01-15 ENCOUNTER — Ambulatory Visit: Payer: Self-pay

## 2019-01-17 ENCOUNTER — Ambulatory Visit: Payer: Self-pay

## 2019-03-01 ENCOUNTER — Other Ambulatory Visit: Payer: Self-pay | Admitting: Acute Care

## 2019-03-01 DIAGNOSIS — Z87891 Personal history of nicotine dependence: Secondary | ICD-10-CM

## 2019-03-01 DIAGNOSIS — Z122 Encounter for screening for malignant neoplasm of respiratory organs: Secondary | ICD-10-CM

## 2019-03-15 ENCOUNTER — Other Ambulatory Visit: Payer: Self-pay | Admitting: Family Medicine

## 2019-03-28 ENCOUNTER — Other Ambulatory Visit: Payer: Self-pay

## 2019-03-28 ENCOUNTER — Ambulatory Visit (INDEPENDENT_AMBULATORY_CARE_PROVIDER_SITE_OTHER): Payer: Medicare Other

## 2019-03-28 DIAGNOSIS — J4540 Moderate persistent asthma, uncomplicated: Secondary | ICD-10-CM

## 2019-04-19 ENCOUNTER — Other Ambulatory Visit: Payer: Self-pay | Admitting: *Deleted

## 2019-04-19 MED ORDER — FAMOTIDINE 20 MG PO TABS: 20.0000 mg | ORAL_TABLET | Freq: Two times a day (BID) | ORAL | 0 refills | Status: DC

## 2019-04-30 ENCOUNTER — Encounter: Payer: Self-pay | Admitting: Family Medicine

## 2019-04-30 ENCOUNTER — Other Ambulatory Visit: Payer: Self-pay

## 2019-04-30 ENCOUNTER — Ambulatory Visit: Payer: Self-pay

## 2019-04-30 ENCOUNTER — Ambulatory Visit (INDEPENDENT_AMBULATORY_CARE_PROVIDER_SITE_OTHER): Payer: Medicare Other | Admitting: Family Medicine

## 2019-04-30 VITALS — BP 100/64 | HR 90 | Temp 98.9°F | Resp 12

## 2019-04-30 DIAGNOSIS — J310 Chronic rhinitis: Secondary | ICD-10-CM

## 2019-04-30 DIAGNOSIS — J4530 Mild persistent asthma, uncomplicated: Secondary | ICD-10-CM

## 2019-04-30 DIAGNOSIS — G7000 Myasthenia gravis without (acute) exacerbation: Secondary | ICD-10-CM

## 2019-04-30 DIAGNOSIS — K219 Gastro-esophageal reflux disease without esophagitis: Secondary | ICD-10-CM

## 2019-04-30 DIAGNOSIS — L508 Other urticaria: Secondary | ICD-10-CM

## 2019-04-30 DIAGNOSIS — J4540 Moderate persistent asthma, uncomplicated: Secondary | ICD-10-CM

## 2019-04-30 MED ORDER — EPINEPHRINE 0.3 MG/0.3ML IJ SOAJ: INTRAMUSCULAR | 3 refills | Status: DC

## 2019-04-30 MED ORDER — FAMOTIDINE 20 MG PO TABS: 20.0000 mg | ORAL_TABLET | Freq: Two times a day (BID) | ORAL | 3 refills | Status: DC

## 2019-04-30 MED ORDER — MONTELUKAST SODIUM 10 MG PO TABS: 10.0000 mg | ORAL_TABLET | Freq: Every day | ORAL | 3 refills | Status: DC

## 2019-04-30 MED ORDER — LEVALBUTEROL TARTRATE 45 MCG/ACT IN AERO: 2.0000 | INHALATION_SPRAY | Freq: Four times a day (QID) | RESPIRATORY_TRACT | 0 refills | Status: AC | PRN

## 2019-04-30 MED ORDER — FLUTICASONE PROPIONATE 50 MCG/ACT NA SUSP: NASAL | 3 refills | Status: AC

## 2019-04-30 NOTE — Patient Instructions (Addendum)
Urticaria Continue Aller-tec (cetirizine) once a day as needed for itching or a runny nose.  Begin famotidine 20 mg twice a day for breakthrough itching Continue Xolair injections 300 mg once every month for your chronic hives  Asthma Continue Montelukast  10 mg once a day to prevent cough and wheeze Xopenex 2 puffs every 6 hours if needed for cough, wheeze, or shortness of breath.  You may use this 5-15 minutes before exercise.   Allergic rhinitis Continue Flonase 2 sprays in each nostril once a day as needed for a stuffy nose Begin saline nasal rinses as needed for nasal symptoms Begin Mucinex [phone] mg twice a day for thick nasal drainage  Stinging insects Avoid stinging insects. In case of an allergic reaction, give Benadryl 50 mg every 4 hours, and if life-threatening symptoms occur, inject with EpiPen    Continue on your other medications  Call me if this treatment plan is not working for you  Follow up in 6 months or sooner if needed

## 2019-04-30 NOTE — Progress Notes (Signed)
[address] [place] [address] Dept: [phone]  FOLLOW UP NOTE  Patient ID: Mandy Shaw, female    DOB: April 21, 1961  Age: 58 y.o. MRN: [id number] Date of Office Visit: 04/30/2019  Assessment  Chief Complaint: Allergic Rhinitis  and Asthma  HPI Mandy Shaw is a 58 year old female who presents to the clinic for a follow up visit. She reports that her allergic rhinitis has been poorly controlled with throat clearing and thick post nasal drainage which, about 4 days out of the week, causes cough and occasional post tussive vomiting. She is not currently using any nasal saline rinses or steroid nasal spray. She reports that she was unable to afford the azelastine nasal spray. She continues cetirizine once a day. Chronic urticaria is reported as well controlled with no break through hives or itching while continuing on Xolair injections.  Asthma is reported as moderately well controlled with some shortness of breath and cough with clear mucus that occurs when she is having post nasal drainage. She continues montelukast 10 mg once a day and has not used Xopenex since her last visit to the clinic. Reflux is reported as well controlled with no heartburn while continuing with dietary and lifestyle modifications as well as continuing to take omeprazole twice a day. She has not had accidental sting from a stinging insect nor has she needed to use her EpiPen since her last visit to the clinic. Her current medications are listed in the chart.   Drug Allergies:  Allergies  Allergen Reactions  . Albuterol Anaphylaxis  . Bee Venom Anaphylaxis  . Other Anaphylaxis    MANGO, PARSLEY. Nasal canula causes eye swelling and itchy eye.   [name] Seed Anaphylaxis  . Penicillins Anaphylaxis and Rash    Has patient had a PCN reaction causing immediate rash, facial/tongue/throat swelling, SOB or lightheadedness with hypotension: Yes Has patient had a PCN reaction causing severe rash involving mucus  membranes or skin necrosis: Yes Has patient had a PCN reaction that required hospitalization No Has patient had a PCN reaction occurring within the last 10 years: No If all of the above answers are "NO", then may proceed with Cephalosporin use.   . Silica [Nutritional Supplements] Itching  . Sulfonamide Derivatives Anaphylaxis  . Elavil [Amitriptyline Hcl]     Eyes out of focus, dry eye  . Linzess [Linaclotide] Other (See Comments)    dizziness  . Macrodantin [Nitrofurantoin Macrocrystal] Itching  . Neurontin [Gabapentin] Other (See Comments)    Unable to walk or talk   . Pregabalin Other (See Comments)    dizziness    Physical Exam: BP 100/64   Pulse 90   Temp 98.9 F (37.2 C) (Oral)   Resp 12   LMP 06/20/2014   SpO2 97%    Physical Exam Vitals signs reviewed.  Constitutional:      Appearance: Normal appearance.  HENT:     Head: Normocephalic and atraumatic.     Right Ear: Tympanic membrane normal.     Left Ear: Tympanic membrane normal.     Nose:     Comments: Bilateral nares slightly erythematous with clear nasal drainage noted. Septal deviation noted. Pharynx slightly erythematous with no exudate. Ears normal. Eyes normal. Eyes:     Conjunctiva/sclera: Conjunctivae normal.  Neck:     Musculoskeletal: Normal range of motion and neck supple.  Cardiovascular:     Rate and Rhythm: Normal rate and regular rhythm.     Heart sounds: Normal heart  sounds. No murmur.  Pulmonary:     Effort: Pulmonary effort is normal.     Breath sounds: Normal breath sounds.     Comments: Lungs clear to auscultation Musculoskeletal: Normal range of motion.  Skin:    General: Skin is warm and dry.  Neurological:     Mental Status: She is alert and oriented to person, place, and time.  Psychiatric:        Mood and Affect: Mood normal.        Behavior: Behavior normal.        Thought Content: Thought content normal.        Judgment: Judgment normal.     Diagnostics: FVC 4.14, FEV1  2.84. Predicted FVC 3.48, predicted FEV1 2.70. Spirometry indicates normal ventilatory function.   Assessment and Plan: 1. Mild persistent asthma without complication   2. Chronic urticaria   3. Chronic rhinitis   4. Gastroesophageal reflux disease without esophagitis   5. Myasthenia gravis ([place])     Meds ordered this encounter  Medications  . levalbuterol (XOPENEX HFA) 45 MCG/ACT inhaler    Sig: Inhale 2 puffs into the lungs every 6 (six) hours as needed for wheezing.    Dispense:  3 Inhaler    Refill:  0    Hold for pt she will call when needed  . montelukast (SINGULAIR) 10 MG tablet    Sig: Take 1 tablet (10 mg total) by mouth at bedtime.    Dispense:  90 tablet    Refill:  3  . fluticasone (FLONASE) 50 MCG/ACT nasal spray    Sig: 2 spray in each nostril once daily    Dispense:  48 g    Refill:  3  . famotidine (PEPCID) 20 MG tablet    Sig: Take 1 tablet (20 mg total) by mouth 2 (two) times daily.    Dispense:  180 tablet    Refill:  3    Apt 04/30/19  . EPINEPHrine (EPIPEN 2-PAK) 0.3 mg/0.3 mL IJ SOAJ injection    Sig: Use as directed for severe allergic reactions    Dispense:  2 each    Refill:  3    Dispense [employer] generic only    Patient Instructions  Urticaria Continue Aller-tec (cetirizine) once a day as needed for itching or a runny nose.  Begin famotidine 20 mg twice a day for breakthrough itching Continue Xolair injections 300 mg once every month for your chronic hives  Asthma Continue Montelukast  10 mg once a day to prevent cough and wheeze Xopenex 2 puffs every 6 hours if needed for cough, wheeze, or shortness of breath.  You may use this 5-15 minutes before exercise.   Allergic rhinitis Continue Flonase 2 sprays in each nostril once a day as needed for a stuffy nose Begin saline nasal rinses as needed for nasal symptoms Begin Mucinex [phone] mg twice a day for thick nasal drainage  Stinging insects Avoid stinging insects. In case of an allergic  reaction, give Benadryl 50 mg every 4 hours, and if life-threatening symptoms occur, inject with EpiPen    Continue on your other medications  Call me if this treatment plan is not working for you  Follow up in 6 months or sooner if needed    Return in about 6 months (around 10/31/2019), or if symptoms worsen or fail to improve.   Thank you for the opportunity to care for this patient.  Please do not hesitate to contact me with questions.  Webb Silversmith  Ambs, FNP [facility]  I have provided oversight concerning [name]' evaluation and treatment of this patient's health issues addressed during today's encounter. I agree with the assessment and therapeutic plan as outlined in the note.   Thank you for the opportunity to care for this patient.  Please do not hesitate to contact me with questions.  [name], M.D.  [facility] [address] [place], [place] [address] [phone]

## 2019-05-01 ENCOUNTER — Telehealth: Payer: Self-pay

## 2019-05-01 ENCOUNTER — Telehealth: Payer: Self-pay | Admitting: *Deleted

## 2019-05-01 ENCOUNTER — Telehealth: Payer: Self-pay | Admitting: Acute Care

## 2019-05-01 ENCOUNTER — Other Ambulatory Visit: Payer: Self-pay | Admitting: Family Medicine

## 2019-05-01 DIAGNOSIS — Z1231 Encounter for screening mammogram for malignant neoplasm of breast: Secondary | ICD-10-CM

## 2019-05-01 MED ORDER — FAMOTIDINE 20 MG PO TABS: 20.0000 mg | ORAL_TABLET | Freq: Two times a day (BID) | ORAL | 3 refills | Status: DC

## 2019-05-01 NOTE — Telephone Encounter (Signed)
PA has been approved for xopenex hfa

## 2019-05-01 NOTE — Telephone Encounter (Signed)
PA for Xopenex HFA has been submitted through cover my meds. Waiting for response from ins.

## 2019-05-01 NOTE — Telephone Encounter (Signed)
Patient trying to get refill on famotadine.  [facility] stating too early for refill and has a problem with directions.  Patient asking [place] to call in a new rx to [facility]. Sent in new rx.  90 days supply.

## 2019-05-02 ENCOUNTER — Telehealth: Payer: Self-pay | Admitting: *Deleted

## 2019-05-02 MED ORDER — EPINEPHRINE 0.3 MG/0.3ML IJ SOAJ: INTRAMUSCULAR | 3 refills | Status: AC

## 2019-05-02 NOTE — Telephone Encounter (Signed)
We [facility]en [facility] and [facility] called saying that they dispense mylan and teva generics but when it is shipped they can not guarantee which one is sent. I canceled order and [facility] to ensure pt gets mylan generic.

## 2019-05-02 NOTE — Telephone Encounter (Signed)
Mandy Shaw, can you contact pt to schedule f/u low dose chest CT?

## 2019-05-03 NOTE — Telephone Encounter (Signed)
LVM for Patient to return call to schedule this CT

## 2019-05-04 NOTE — Telephone Encounter (Signed)
Patient was already scheduled to have mammogram on 06/06/2019 and [facility] thought it would save the patient another trip and schedule the LCS CT at the same time. LVM for patient about this CT appt and if she wanted it scheduled earlier to just call me back

## 2019-05-29 ENCOUNTER — Other Ambulatory Visit: Payer: Self-pay

## 2019-05-29 ENCOUNTER — Ambulatory Visit (INDEPENDENT_AMBULATORY_CARE_PROVIDER_SITE_OTHER): Payer: Medicare Other

## 2019-05-29 DIAGNOSIS — J4540 Moderate persistent asthma, uncomplicated: Secondary | ICD-10-CM | POA: Diagnosis not present

## 2019-05-29 DIAGNOSIS — J4530 Mild persistent asthma, uncomplicated: Secondary | ICD-10-CM

## 2019-06-06 ENCOUNTER — Other Ambulatory Visit: Payer: Self-pay

## 2019-06-06 ENCOUNTER — Ambulatory Visit (INDEPENDENT_AMBULATORY_CARE_PROVIDER_SITE_OTHER): Payer: Medicare Other

## 2019-06-06 ENCOUNTER — Ambulatory Visit: Payer: Medicare Other

## 2019-06-06 DIAGNOSIS — Z1231 Encounter for screening mammogram for malignant neoplasm of breast: Secondary | ICD-10-CM

## 2019-06-06 DIAGNOSIS — Z87891 Personal history of nicotine dependence: Secondary | ICD-10-CM

## 2019-06-06 DIAGNOSIS — Z122 Encounter for screening for malignant neoplasm of respiratory organs: Secondary | ICD-10-CM

## 2019-06-08 ENCOUNTER — Telehealth: Payer: Self-pay | Admitting: Acute Care

## 2019-06-08 DIAGNOSIS — Z87891 Personal history of nicotine dependence: Secondary | ICD-10-CM

## 2019-06-08 DIAGNOSIS — Z122 Encounter for screening for malignant neoplasm of respiratory organs: Secondary | ICD-10-CM

## 2019-06-08 NOTE — Telephone Encounter (Signed)
Pt informed of CT results per Sarah Groce, NP.  PT verbalized understanding.  Copy sent to PCP.  Order placed for 1 yr f/u CT.  

## 2019-06-08 NOTE — Telephone Encounter (Signed)
Denise please call pt about lung cancer screening results.

## 2019-06-22 ENCOUNTER — Telehealth: Payer: Self-pay

## 2019-06-22 NOTE — Telephone Encounter (Signed)
Pt called to reschedule her biologic appointment because her dentist appt didn't go as smoothly as expected.

## 2019-06-25 ENCOUNTER — Ambulatory Visit: Payer: Self-pay

## 2019-07-24 ENCOUNTER — Ambulatory Visit: Payer: Self-pay

## 2019-07-31 ENCOUNTER — Ambulatory Visit (INDEPENDENT_AMBULATORY_CARE_PROVIDER_SITE_OTHER): Payer: Medicare Other

## 2019-07-31 ENCOUNTER — Other Ambulatory Visit: Payer: Self-pay

## 2019-07-31 DIAGNOSIS — L500 Allergic urticaria: Secondary | ICD-10-CM | POA: Diagnosis not present

## 2019-08-28 ENCOUNTER — Ambulatory Visit: Payer: Self-pay

## 2020-02-14 ENCOUNTER — Other Ambulatory Visit: Payer: Self-pay | Admitting: Family Medicine

## 2020-05-21 ENCOUNTER — Other Ambulatory Visit: Payer: Self-pay | Admitting: Family Medicine

## 2020-06-23 ENCOUNTER — Telehealth: Payer: Self-pay | Admitting: Acute Care

## 2020-06-23 DIAGNOSIS — Z87891 Personal history of nicotine dependence: Secondary | ICD-10-CM

## 2020-06-23 NOTE — Telephone Encounter (Signed)
Mandy Shaw, Can you contact pt to schedule f/u low dose ct?

## 2020-06-23 NOTE — Telephone Encounter (Signed)
Langley Gauss I will need a new CT order to get her LCS CT scheduled

## 2020-06-26 NOTE — Telephone Encounter (Signed)
LVM for patient to return call to schedule the LCS CT

## 2020-06-26 NOTE — Telephone Encounter (Signed)
New low dose ct order placed.

## 2020-07-10 ENCOUNTER — Other Ambulatory Visit: Payer: Self-pay | Admitting: Family Medicine

## 2020-07-10 DIAGNOSIS — Z1231 Encounter for screening mammogram for malignant neoplasm of breast: Secondary | ICD-10-CM

## 2020-07-10 NOTE — Telephone Encounter (Signed)
Bonnita Nasuti from [facility] in [place] scheduled the LCS CT for Mrs. Dugo on 07/23/2020 @ 10:00am right after her mammogram

## 2020-07-23 ENCOUNTER — Ambulatory Visit: Payer: Medicare Other

## 2020-07-30 ENCOUNTER — Ambulatory Visit (INDEPENDENT_AMBULATORY_CARE_PROVIDER_SITE_OTHER): Payer: Medicare Other

## 2020-07-30 ENCOUNTER — Other Ambulatory Visit: Payer: Self-pay

## 2020-07-30 DIAGNOSIS — Z122 Encounter for screening for malignant neoplasm of respiratory organs: Secondary | ICD-10-CM

## 2020-07-30 DIAGNOSIS — Z1231 Encounter for screening mammogram for malignant neoplasm of breast: Secondary | ICD-10-CM | POA: Diagnosis not present

## 2020-07-30 DIAGNOSIS — Z87891 Personal history of nicotine dependence: Secondary | ICD-10-CM

## 2020-07-31 NOTE — Progress Notes (Signed)
Please call patient and let them  know their  low dose Ct was read as a Lung RADS 2: nodules that are benign in appearance and behavior with a very low likelihood of becoming a clinically active cancer due to size or lack of growth. Recommendation per radiology is for a repeat LDCT in 12 months. .Please let them  know we will order and schedule their  annual screening scan for 07/2021. Please let them  know there was notation of CAD on their  scan.  Please remind the patient  that this is a non-gated exam therefore degree or severity of disease  cannot be determined. Please have them  follow up with their PCP regarding potential risk factor modification, dietary therapy or pharmacologic therapy if clinically indicated. Pt.  is not  currently on statin therapy. Please place order for annual  screening scan for  09/2020 and fax results to PCP. Thanks so much.

## 2020-07-31 NOTE — Progress Notes (Signed)
Please call patient and let them  know their  low dose Ct was read as a Lung RADS 2: nodules that are benign in appearance and behavior with a very low likelihood of becoming a clinically active cancer due to size or lack of growth. Recommendation per radiology is for a repeat LDCT in 12 months. .Please let them  know we will order and schedule their  annual screening scan for 07/2021. Please let them  know there was notation of CAD on their  scan.  Please remind the patient  that this is a non-gated exam therefore degree or severity of disease  cannot be determined. Please have them  follow up with their PCP regarding potential risk factor modification, dietary therapy or pharmacologic therapy if clinically indicated. Pt.  is not  currently on statin therapy. Please place order for annual  screening scan for  07/2021 and fax results to PCP. Thanks so much. 

## 2020-08-05 ENCOUNTER — Telehealth: Payer: Self-pay | Admitting: Acute Care

## 2020-08-05 DIAGNOSIS — Z87891 Personal history of nicotine dependence: Secondary | ICD-10-CM

## 2020-08-05 NOTE — Telephone Encounter (Signed)
Mandy Shaw this pt stated that she was calling you back.  I did not see anything in her chart where you had called her.

## 2020-08-05 NOTE — Telephone Encounter (Signed)
Pt informed of CT results per Sarah Groce, NP.  PT verbalized understanding.  Copy sent to PCP.  Order placed for 1 yr f/u CT.  

## 2021-07-27 ENCOUNTER — Other Ambulatory Visit: Payer: Self-pay | Admitting: Family Medicine

## 2021-07-27 DIAGNOSIS — Z1231 Encounter for screening mammogram for malignant neoplasm of breast: Secondary | ICD-10-CM

## 2021-08-12 ENCOUNTER — Ambulatory Visit (INDEPENDENT_AMBULATORY_CARE_PROVIDER_SITE_OTHER): Payer: Medicare Other

## 2021-08-12 ENCOUNTER — Other Ambulatory Visit: Payer: Self-pay

## 2021-08-12 DIAGNOSIS — Z1231 Encounter for screening mammogram for malignant neoplasm of breast: Secondary | ICD-10-CM | POA: Diagnosis not present

## 2021-10-29 ENCOUNTER — Other Ambulatory Visit: Payer: Self-pay | Admitting: *Deleted

## 2021-10-29 DIAGNOSIS — Z87891 Personal history of nicotine dependence: Secondary | ICD-10-CM

## 2022-08-02 ENCOUNTER — Other Ambulatory Visit: Payer: Self-pay | Admitting: Family Medicine

## 2022-08-02 DIAGNOSIS — Z1231 Encounter for screening mammogram for malignant neoplasm of breast: Secondary | ICD-10-CM

## 2022-08-18 ENCOUNTER — Ambulatory Visit (INDEPENDENT_AMBULATORY_CARE_PROVIDER_SITE_OTHER): Payer: Medicare Other

## 2022-08-18 DIAGNOSIS — Z1231 Encounter for screening mammogram for malignant neoplasm of breast: Secondary | ICD-10-CM

## 2022-10-13 ENCOUNTER — Encounter: Payer: Self-pay | Admitting: *Deleted

## 2023-01-07 ENCOUNTER — Other Ambulatory Visit: Payer: Self-pay | Admitting: *Deleted

## 2023-01-07 DIAGNOSIS — Z122 Encounter for screening for malignant neoplasm of respiratory organs: Secondary | ICD-10-CM

## 2023-01-07 DIAGNOSIS — Z87891 Personal history of nicotine dependence: Secondary | ICD-10-CM

## 2023-10-03 ENCOUNTER — Ambulatory Visit: Payer: Medicare Other

## 2023-10-06 ENCOUNTER — Ambulatory Visit: Payer: Medicare Other
